# Patient Record
Sex: Male | Born: 1945 | Race: White | Hispanic: No | Smoking: Former smoker
Health system: Southern US, Community
[De-identification: ages and names within clinical notes are randomized; demographics above are authoritative.]

## PROBLEM LIST (undated history)

## (undated) DIAGNOSIS — I10 Essential (primary) hypertension: Secondary | ICD-10-CM

## (undated) DIAGNOSIS — I252 Old myocardial infarction: Secondary | ICD-10-CM

## (undated) DIAGNOSIS — M109 Gout, unspecified: Secondary | ICD-10-CM

## (undated) DIAGNOSIS — I2510 Atherosclerotic heart disease of native coronary artery without angina pectoris: Secondary | ICD-10-CM

## (undated) DIAGNOSIS — J939 Pneumothorax, unspecified: Secondary | ICD-10-CM

## (undated) HISTORY — PX: OTHER SURGICAL HISTORY: SHX169

---

## 2001-11-15 ENCOUNTER — Ambulatory Visit (HOSPITAL_COMMUNITY): Admission: RE | Admit: 2001-11-15 | Discharge: 2001-11-15 | Payer: Self-pay

## 2001-12-16 ENCOUNTER — Encounter: Payer: Self-pay | Admitting: Emergency Medicine

## 2001-12-16 ENCOUNTER — Emergency Department (HOSPITAL_COMMUNITY): Admission: EM | Admit: 2001-12-16 | Discharge: 2001-12-16 | Payer: Self-pay | Admitting: Emergency Medicine

## 2002-04-22 ENCOUNTER — Emergency Department (HOSPITAL_COMMUNITY): Admission: EM | Admit: 2002-04-22 | Discharge: 2002-04-22 | Payer: Self-pay | Admitting: *Deleted

## 2002-04-22 ENCOUNTER — Encounter: Payer: Self-pay | Admitting: *Deleted

## 2002-05-04 ENCOUNTER — Emergency Department (HOSPITAL_COMMUNITY): Admission: EM | Admit: 2002-05-04 | Discharge: 2002-05-04 | Payer: Self-pay | Admitting: Emergency Medicine

## 2003-06-01 ENCOUNTER — Emergency Department (HOSPITAL_COMMUNITY): Admission: EM | Admit: 2003-06-01 | Discharge: 2003-06-01 | Payer: Self-pay | Admitting: Emergency Medicine

## 2003-06-03 ENCOUNTER — Emergency Department (HOSPITAL_COMMUNITY): Admission: EM | Admit: 2003-06-03 | Discharge: 2003-06-03 | Payer: Self-pay | Admitting: Emergency Medicine

## 2003-09-01 ENCOUNTER — Ambulatory Visit (HOSPITAL_COMMUNITY): Admission: RE | Admit: 2003-09-01 | Discharge: 2003-09-01 | Payer: Self-pay | Admitting: Family Medicine

## 2003-12-03 ENCOUNTER — Emergency Department (HOSPITAL_COMMUNITY): Admission: EM | Admit: 2003-12-03 | Discharge: 2003-12-03 | Payer: Self-pay | Admitting: Emergency Medicine

## 2004-04-14 ENCOUNTER — Ambulatory Visit: Payer: Self-pay | Admitting: Orthopedic Surgery

## 2004-04-14 ENCOUNTER — Emergency Department (HOSPITAL_COMMUNITY): Admission: EM | Admit: 2004-04-14 | Discharge: 2004-04-14 | Payer: Self-pay | Admitting: Emergency Medicine

## 2004-04-29 ENCOUNTER — Ambulatory Visit: Payer: Self-pay | Admitting: Orthopedic Surgery

## 2007-08-04 ENCOUNTER — Emergency Department: Payer: Self-pay | Admitting: Emergency Medicine

## 2007-12-30 ENCOUNTER — Emergency Department: Payer: Self-pay | Admitting: Emergency Medicine

## 2008-09-16 ENCOUNTER — Emergency Department: Payer: Self-pay | Admitting: Emergency Medicine

## 2009-01-28 ENCOUNTER — Emergency Department: Payer: Self-pay | Admitting: Emergency Medicine

## 2010-09-17 ENCOUNTER — Encounter: Payer: Self-pay | Admitting: *Deleted

## 2010-09-17 ENCOUNTER — Emergency Department (HOSPITAL_COMMUNITY)
Admission: EM | Admit: 2010-09-17 | Discharge: 2010-09-17 | Disposition: A | Discharge status: Home / Self Care | Payer: Self-pay | Attending: Emergency Medicine | Admitting: Emergency Medicine

## 2010-09-17 ENCOUNTER — Emergency Department (HOSPITAL_COMMUNITY): Payer: Self-pay

## 2010-09-17 DIAGNOSIS — IMO0002 Reserved for concepts with insufficient information to code with codable children: Secondary | ICD-10-CM | POA: Insufficient documentation

## 2010-09-17 DIAGNOSIS — Z87891 Personal history of nicotine dependence: Secondary | ICD-10-CM | POA: Insufficient documentation

## 2010-09-17 DIAGNOSIS — T148XXA Other injury of unspecified body region, initial encounter: Secondary | ICD-10-CM | POA: Insufficient documentation

## 2010-09-17 DIAGNOSIS — S0003XA Contusion of scalp, initial encounter: Secondary | ICD-10-CM | POA: Insufficient documentation

## 2010-09-17 DIAGNOSIS — I10 Essential (primary) hypertension: Secondary | ICD-10-CM | POA: Insufficient documentation

## 2010-09-17 DIAGNOSIS — S0990XA Unspecified injury of head, initial encounter: Secondary | ICD-10-CM

## 2010-09-17 DIAGNOSIS — Y92009 Unspecified place in unspecified non-institutional (private) residence as the place of occurrence of the external cause: Secondary | ICD-10-CM | POA: Insufficient documentation

## 2010-09-17 DIAGNOSIS — S1093XA Contusion of unspecified part of neck, initial encounter: Secondary | ICD-10-CM | POA: Insufficient documentation

## 2010-09-17 LAB — URINALYSIS, ROUTINE W REFLEX MICROSCOPIC
Bilirubin Urine: NEGATIVE
Ketones, ur: NEGATIVE mg/dL
Leukocytes, UA: NEGATIVE
Nitrite: NEGATIVE
pH: 7 (ref 5.0–8.0)

## 2010-09-17 MED ORDER — METHOCARBAMOL 500 MG PO TABS: 500.0000 mg | ORAL_TABLET | Freq: Two times a day (BID) | ORAL | Status: AC

## 2010-09-17 MED ORDER — OXYCODONE-ACETAMINOPHEN 5-325 MG PO TABS
2.0000 | ORAL_TABLET | Freq: Once | ORAL | Status: AC
  Administered 2010-09-17: 2 via ORAL
  Filled 2010-09-17: qty 2

## 2010-09-17 MED ORDER — OXYCODONE-ACETAMINOPHEN 5-325 MG PO TABS: 2.0000 | ORAL_TABLET | ORAL | Status: AC | PRN

## 2010-09-17 NOTE — ED Provider Notes (Signed)
Scribed for Toy Baker, MD, the patient was seen in room APA06/APA06 . This chart was scribed by Ellie Lunch. This patient's care was started at 9:22 AM.   CSN: [id number] Arrival date & time: 09/17/2010  9:04 AM  Chief Complaint  Patient presents with  . Fall   Patient is a 65 y.o. male presenting with fall. The history is provided by the patient.  Fall The accident occurred 1 to 2 hours ago. The fall occurred while standing. The point of impact was the head (and lower back). The pain is present in the head (lower back). The pain is at a severity of 10/10. The pain is severe. Pertinent negatives include no visual change, no abdominal pain, no bowel incontinence, no nausea, no vomiting and no loss of consciousness. The symptoms are aggravated by activity.   Gregory [name] is a 65 y.o. male who presents to the Emergency Department complaining of a fall. Patient was taking a shower and slipped hitting his back and head. The pain in located on the back of his head and right lower back. Pain is constant and is made worse with movement and deep breathing. Pt denies pain radiates. Pt reports he did not lose consciousness. No blood thinners. No radicular symptoms going down right leg.   Past Medical History  Diagnosis Date  . Hypertension   . Lung collapse     Past Surgical History  Procedure Date  . Arm surgery     History reviewed. No pertinent family history.  History  Substance Use Topics  . Smoking status: Former Games developer  . Smokeless tobacco: Not on file  . Alcohol Use: No      Review of Systems  Cardiovascular: Negative for chest pain.  Gastrointestinal: Negative for nausea, vomiting, abdominal pain and bowel incontinence.  Musculoskeletal: Positive for back pain.  Neurological: Negative for loss of consciousness.  All other systems reviewed and are negative.    Physical Exam  BP 200/107  Pulse 73  Temp(Src) 98.7 F (37.1 C) (Oral)  Resp 18  Ht 5\' 4"  (1.626 m)   Wt 230 lb (104.327 kg)  BMI 39.48 kg/m2  SpO2 97%  Physical Exam  Nursing note and vitals reviewed. Constitutional: He is oriented to person, place, and time. He appears well-developed and well-nourished.  HENT:       Small occipital hematoma. No facial trauma.  Eyes: EOM are normal. Pupils are equal, round, and reactive to light.  Neck: Neck supple.  Pulmonary/Chest: Effort normal. He exhibits no tenderness.  Abdominal: Soft. There is no tenderness.  Musculoskeletal:       No pain to palpation cervical, thoracic, lumbar spine. Cervical paraspinal tenderness.  Tender Right CVA. Skin intact no bruising.   Neurological: He is alert and oriented to person, place, and time.       Straight leg raise, patellar reflexes normal.   Psychiatric: He has a normal mood and affect.   OTHER DATA REVIEWED: Nursing notes, vital signs, and past medical records reviewed.  DIAGNOSTIC STUDIES: Oxygen Saturation is 97% on room air, normal by my interpretation.    LABS / RADIOLOGY:  Results for orders placed during the hospital encounter of 09/17/10  URINALYSIS, ROUTINE W REFLEX MICROSCOPIC      Component Value Range   Color, Urine YELLOW  YELLOW    Appearance CLEAR  CLEAR    Specific Gravity, Urine 1.020  1.005 - 1.030    pH 7.0  5.0 - 8.0    Glucose,  UA NEGATIVE  NEGATIVE (mg/dL)   Hgb urine dipstick NEGATIVE  NEGATIVE    Bilirubin Urine NEGATIVE  NEGATIVE    Ketones, ur NEGATIVE  NEGATIVE (mg/dL)   Protein, ur TRACE (*) NEGATIVE (mg/dL)   Urobilinogen, UA 0.2  0.0 - 1.0 (mg/dL)   Nitrite NEGATIVE  NEGATIVE    Leukocytes, UA NEGATIVE  NEGATIVE   URINE MICROSCOPIC-ADD ON      Component Value Range   WBC, UA 0-2  <3 (WBC/hpf)   RBC / HPF 0-2  <3 (RBC/hpf)   Ct Head Wo Contrast  09/17/2010  *RADIOLOGY REPORT*  Clinical Data: Larey Seat 1-2 hours ago, headache, struck back of head  CT HEAD WITHOUT CONTRAST  Technique:  Contiguous axial images were obtained from the base of the skull through the  vertex without contrast.  Comparison: None  Findings: Generalized atrophy. Normal ventricular morphology. No midline shift or mass effect. Small vessel chronic ischemic changes of deep cerebral white matter. No definite intracranial hemorrhage, mass lesion, or evidence of acute infarction. No extra-axial fluid collections. Diffuse enlargement of basilar artery. Partial opacification of ethmoid air cells and left sphenoid sinus. Skull intact.  IMPRESSION: Atrophy with small vessel chronic ischemic changes of deep cerebral white matter. No acute intracranial abnormalities. Diffuse enlargement of the basilar artery.  Original Report Authenticated By: Lollie Marrow, M.D.    MDM:  meds given here, will tx for muscle strain    MEDICATIONS GIVEN IN THE E.D.  Medications  acetaminophen (TYLENOL) 500 MG tablet (not administered)  oxyCODONE-acetaminophen (PERCOCET) 5-325 MG per tablet 2 tablet (2 tablet Oral Given 09/17/10 1019)    DISCHARGE MEDICATIONS: New Prescriptions   No medications on file    Procedures        Toy Baker, MD 09/17/10 1258

## 2010-09-17 NOTE — ED Notes (Signed)
Pt fell in tub and hit his right lower back on the tub and the back of his head on the commode. Pt denies loss of consciousness. Pt has hematoma on back of his head. Pt c/o worsening back pain with movement of deep breathing.

## 2012-09-08 ENCOUNTER — Emergency Department (HOSPITAL_COMMUNITY): Payer: Self-pay

## 2012-09-08 ENCOUNTER — Emergency Department (HOSPITAL_COMMUNITY)
Admission: EM | Admit: 2012-09-08 | Discharge: 2012-09-09 | Disposition: A | Discharge status: Home / Self Care | Payer: Self-pay | Attending: Surgery | Admitting: Surgery

## 2012-09-08 ENCOUNTER — Encounter (HOSPITAL_COMMUNITY): Payer: Self-pay | Admitting: Emergency Medicine

## 2012-09-08 DIAGNOSIS — I10 Essential (primary) hypertension: Secondary | ICD-10-CM | POA: Insufficient documentation

## 2012-09-08 DIAGNOSIS — E669 Obesity, unspecified: Secondary | ICD-10-CM | POA: Insufficient documentation

## 2012-09-08 DIAGNOSIS — K4030 Unilateral inguinal hernia, with obstruction, without gangrene, not specified as recurrent: Secondary | ICD-10-CM | POA: Insufficient documentation

## 2012-09-08 DIAGNOSIS — R109 Unspecified abdominal pain: Secondary | ICD-10-CM | POA: Insufficient documentation

## 2012-09-08 DIAGNOSIS — K4090 Unilateral inguinal hernia, without obstruction or gangrene, not specified as recurrent: Secondary | ICD-10-CM

## 2012-09-08 LAB — CBC WITH DIFFERENTIAL/PLATELET
Basophils Absolute: 0.1 10*3/uL (ref 0.0–0.1)
Basophils Relative: 0 % (ref 0–1)
Eosinophils Absolute: 0.4 10*3/uL (ref 0.0–0.7)
MCH: 29.8 pg (ref 26.0–34.0)
MCHC: 33.6 g/dL (ref 30.0–36.0)
Neutrophils Relative %: 57 % (ref 43–77)
Platelets: 219 10*3/uL (ref 150–400)
RDW: 14.3 % (ref 11.5–15.5)

## 2012-09-08 LAB — URINALYSIS, ROUTINE W REFLEX MICROSCOPIC
Glucose, UA: NEGATIVE mg/dL
Hgb urine dipstick: NEGATIVE
Ketones, ur: NEGATIVE mg/dL
Leukocytes, UA: NEGATIVE
pH: 7 (ref 5.0–8.0)

## 2012-09-08 LAB — COMPREHENSIVE METABOLIC PANEL
ALT: 22 U/L (ref 0–53)
AST: 25 U/L (ref 0–37)
CO2: 25 mEq/L (ref 19–32)
Chloride: 103 mEq/L (ref 96–112)
GFR calc Af Amer: >90 mL/min (ref >90)
GFR calc non Af Amer: 85 mL/min — ABNORMAL LOW (ref >90)
Glucose, Bld: 99 mg/dL (ref 70–99)
Sodium: 137 mEq/L (ref 135–145)
Total Bilirubin: 1 mg/dL (ref 0.3–1.2)

## 2012-09-08 LAB — LIPASE, BLOOD: Lipase: 27 U/L (ref 11–59)

## 2012-09-08 MED ORDER — ONDANSETRON HCL 4 MG/2ML IJ SOLN
4.0000 mg | Freq: Once | INTRAMUSCULAR | Status: AC
  Administered 2012-09-08: 4 mg via INTRAVENOUS
  Filled 2012-09-08: qty 2

## 2012-09-08 MED ORDER — SODIUM CHLORIDE 0.9 % IV BOLUS (SEPSIS)
1000.0000 mL | Freq: Once | INTRAVENOUS | Status: AC
  Administered 2012-09-08: 1000 mL via INTRAVENOUS

## 2012-09-08 MED ORDER — HYDROMORPHONE HCL PF 1 MG/ML IJ SOLN
1.0000 mg | Freq: Once | INTRAMUSCULAR | Status: AC
  Administered 2012-09-08: 1 mg via INTRAVENOUS
  Filled 2012-09-08: qty 1

## 2012-09-08 MED ORDER — SODIUM CHLORIDE 0.9 % IV BOLUS (SEPSIS)
1000.0000 mL | Freq: Once | INTRAVENOUS | Status: AC
Start: 2012-09-08 — End: 2012-09-09
  Administered 2012-09-08: 1000 mL via INTRAVENOUS

## 2012-09-08 MED ORDER — IOHEXOL 300 MG/ML  SOLN
100.0000 mL | Freq: Once | INTRAMUSCULAR | Status: AC | PRN
  Administered 2012-09-08: 100 mL via INTRAVENOUS

## 2012-09-08 MED ORDER — IOHEXOL 300 MG/ML  SOLN
50.0000 mL | Freq: Once | INTRAMUSCULAR | Status: AC | PRN
  Administered 2012-09-08: 50 mL via ORAL

## 2012-09-08 NOTE — ED Notes (Signed)
Pt reports lower abdominal pain x 3 days, denies nausea/vomitting. Denies GU symptoms.

## 2012-09-08 NOTE — ED Provider Notes (Addendum)
Scribed for Gregory Hutching, MD, the patient was seen in room APA11/APA11. This chart was scribed by Lewanda Rife, ED scribe. Patient's care was started at 1808 ' CSN: [id number]     Arrival date & time 09/08/12  1716 History     First MD Initiated Contact with Patient 09/08/12 1757     Chief Complaint  Patient presents with  . Abdominal Pain   (Consider location/radiation/quality/duration/timing/severity/associated sxs/prior Treatment) HPI HPI Comments: Gregory [name] is a 67 y.o. male who presents to the Emergency Department complaining of constant moderate suprapubic abdominal pain without radiation onset 3 days. Reports associated normal bowel movements, and normal appetite. Denies associated fevers, emesis, nausea, and urinary symptoms. Reports symptoms are aggravated by touch and alleviated by nothing. Denies taking any medications PTA to alleviate symptoms. Denies hx of the same. Denies hx of abdominal surgery and significant PMH.  Past Medical History  Diagnosis Date  . Hypertension   . Lung collapse    Past Surgical History  Procedure Laterality Date  . Arm surgery     No family history on file. History  Substance Use Topics  . Smoking status: Former Games developer  . Smokeless tobacco: Not on file  . Alcohol Use: No    Review of Systems  Gastrointestinal: Positive for abdominal pain.   A complete 10 system review of systems was obtained and all systems are negative except as noted in the HPI and PMH.    Allergies  Review of patient's allergies indicates no known allergies.  Home Medications   Current Outpatient Rx  Name  Route  Sig  Dispense  Refill  . naproxen sodium (ALEVE) 220 MG tablet   Oral   Take 220 mg by mouth daily as needed (for pain).          BP 170/102  Pulse 87  Temp(Src) 98.8 F (37.1 C) (Oral)  Resp 16  Ht 5\' 5"  (1.651 m)  Wt 225 lb (102.059 kg)  BMI 37.44 kg/m2  SpO2 95% Physical Exam  Nursing note and vitals reviewed. Constitutional:  He is oriented to person, place, and time. He appears well-developed and well-nourished. No distress.  HENT:  Head: Normocephalic and atraumatic.  Eyes: Conjunctivae and EOM are normal. Pupils are equal, round, and reactive to light.  Neck: Normal range of motion. Neck supple. No tracheal deviation present.  Cardiovascular: Normal rate, regular rhythm and normal heart sounds.   Pulmonary/Chest: Effort normal and breath sounds normal. No respiratory distress.  Abdominal: Soft. Bowel sounds are normal. There is tenderness in the suprapubic area.  Genitourinary:  Right inguinal hernia palpated. Tender on exam  Musculoskeletal: Normal range of motion.  Neurological: He is alert and oriented to person, place, and time.  Skin: Skin is warm and dry.  Psychiatric: He has a normal mood and affect. His behavior is normal.    ED Course   Procedures (including critical care time) Medications  sodium chloride 0.9 % bolus 1,000 mL (1,000 mL Intravenous New Bag/Given 09/08/12 1851)  HYDROmorphone (DILAUDID) injection 1 mg (1 mg Intravenous Given 09/08/12 1850)  ondansetron (ZOFRAN) injection 4 mg (4 mg Intravenous Given 09/08/12 1850)  iohexol (OMNIPAQUE) 300 MG/ML solution 50 mL (50 mL Oral Contrast Given 09/08/12 1859)  iohexol (OMNIPAQUE) 300 MG/ML solution 100 mL (100 mL Intravenous Contrast Given 09/08/12 2001)    Labs Reviewed  COMPREHENSIVE METABOLIC PANEL - Abnormal; Notable for the following:    GFR calc non Af Amer 85 (*)    All other components  within normal limits  CBC WITH DIFFERENTIAL - Abnormal; Notable for the following:    WBC 13.7 (*)    Neutro Abs 7.8 (*)    Lymphs Abs 4.5 (*)    All other components within normal limits  URINALYSIS, ROUTINE W REFLEX MICROSCOPIC  LIPASE, BLOOD    Ct Abdomen Pelvis W Contrast  09/08/2012   *RADIOLOGY REPORT*  Clinical Data: Suprapubic pain  CT ABDOMEN AND PELVIS WITH CONTRAST  Technique:  Multidetector CT imaging of the abdomen and pelvis was  performed following the standard protocol during bolus administration of intravenous contrast.  Contrast: 50mL OMNIPAQUE IOHEXOL 300 MG/ML  SOLN, OMNIPAQUE IOHEXOL 300 MG/ML  SOLN  Comparison: None.  Findings: Sagittal images of the spine shows mild degenerative changes lumbar spine.  There is disc space flattening with vacuum disc phenomenon and mild anterior spurring at L4-L5 level.  Lung bases are unremarkable.  Mild hepatic fatty infiltration.  No focal hepatic mets.  No calcified gallstones are noted within gallbladder. Pancreas, spleen and adrenal glands are unremarkable.  No aortic aneurysm.  Atherosclerotic calcifications of the abdominal aorta and the iliac arteries are noted. SMA atherosclerotic calcifications.  Enhanced kidneys are symmetrical in size.  No hydronephrosis or hydroureter.  There is a cyst in the upper pole of the right kidney posterior aspect measures 3.5 cm.  Cyst in lower pole of the left kidney measures 1.4 cm.  Cyst in the upper pole of the left kidney measures 1.3 cm.  There is bilateral renal symmetrical excretion.  Bilateral visualized proximal ureter is unremarkable.  No small bowel obstruction.  No ascites or free air.  There is stranding of the fat and small amount of fluid probable edema along the right gonadal vein in the right pelvis see axial image 65.  Prostate gland is unremarkable.  There is a large right inguinal hernia.  The hernia is containing in entrapped anterior aspect of the urinary bladder.  This is best visualized in sagittal image 64. The urinary bladder contained within hernia measures at least 6.5 x 6.7 cm.  There is thickening of the urinary bladder wall within hernia.  There is mild stranding of the surrounding fat and mild congestion and vessels are noted adjacent fat.  The findings are suspicious for inflammatory or ischemic changes.  Small left inguinal hernia containing fat measures 2.4 cm.  Normal appendix is clearly visualized in axial image 61.   No pericecal inflammation.  IMPRESSION:  1.  There is a large right inguinal scrotal canal hernia containing more than half of the anterior aspect of the urinary bladder.  The bladder within hernia is distended measures about 6.7 x 6.5 cm. There is thickening of the urinary bladder wall within hernia and there are surrounding mild inflammatory changes.  Findings suspicious for bladder wall inflammation or ischemia.  Clinical correlation is necessary.  The opening of the hernia in axial image 85 measures only 2 cm with strangulation of the mid aspect of the urinary bladder.  2.  There are there is inflammatory changes and mild edema along the right gonadal vein in the right pelvis probable due to inflammatory or ischemic changes within hernia. 3.  Normal appendix.  No pericecal inflammation. 4. No small bowel obstruction.  No free abdominal air.  Critical findings discussed with Dr.Ramsey Guadamuz   Original Report Authenticated By: Natasha Mead, M.D.   No results found. No diagnosis found.    CRITICAL CARE Performed by: Gregory [name] Total critical care time: 30 Critical care time  was exclusive of separately billable procedures and treating other patients. Critical care was necessary to treat or prevent imminent or life-threatening deterioration. Critical care was time spent personally by me on the following activities: development of treatment plan with patient and/or surrogate as well as nursing, discussions with consultants, evaluation of patient's response to treatment, examination of patient, obtaining history from patient or surrogate, ordering and performing treatments and interventions, ordering and review of laboratory studies, ordering and review of radiographic studies, pulse oximetry and re-evaluation of patient's condition. MDM  CT scan reveals a large right inguinal scrotal canal hernia containing greater than 50% of anterior aspect of the urinary bladder.  Initially discussed findings with Dr. Franky Macho general surgeon at [facility].  He recommended consultation with general surgery in [facility] secondary to potential complications from bladder incarceration.  Discussed with Dr. Ovidio Kin.   He will evaluate patient at [facility] emergency department.    I personally performed the services described in this documentation, which was scribed in my presence. The recorded information has been reviewed and is accurate.    Gregory Hutching, MD 09/08/12 1478  Gregory Hutching, MD 09/08/12 2154

## 2012-09-09 ENCOUNTER — Encounter (HOSPITAL_COMMUNITY): Payer: Self-pay | Admitting: Anesthesiology

## 2012-09-09 ENCOUNTER — Encounter (HOSPITAL_COMMUNITY): Admission: EM | Disposition: A | Discharge status: Home / Self Care | Payer: Self-pay | Source: Home / Self Care

## 2012-09-09 ENCOUNTER — Emergency Department (HOSPITAL_COMMUNITY): Payer: Self-pay | Admitting: Anesthesiology

## 2012-09-09 DIAGNOSIS — K4030 Unilateral inguinal hernia, with obstruction, without gangrene, not specified as recurrent: Secondary | ICD-10-CM

## 2012-09-09 HISTORY — PX: INSERTION OF MESH: SHX5868

## 2012-09-09 HISTORY — PX: INGUINAL HERNIA REPAIR: SHX194

## 2012-09-09 SURGERY — REPAIR, HERNIA, INGUINAL, INCARCERATED
Anesthesia: General | Site: Groin | Laterality: Right | Wound class: Clean

## 2012-09-09 MED ORDER — FENTANYL CITRATE 0.05 MG/ML IJ SOLN
INTRAMUSCULAR | Status: DC | PRN
  Administered 2012-09-09: 50 ug via INTRAVENOUS
  Administered 2012-09-09: 150 ug via INTRAVENOUS

## 2012-09-09 MED ORDER — HYDROMORPHONE HCL PF 1 MG/ML IJ SOLN
INTRAMUSCULAR | Status: DC | PRN
  Administered 2012-09-09 (×2): 0.5 mg via INTRAVENOUS
  Administered 2012-09-09: 1 mg via INTRAVENOUS

## 2012-09-09 MED ORDER — LIDOCAINE HCL (CARDIAC) 20 MG/ML IV SOLN
INTRAVENOUS | Status: DC | PRN
  Administered 2012-09-09: 100 mg via INTRAVENOUS

## 2012-09-09 MED ORDER — HYDROCODONE-ACETAMINOPHEN 5-325 MG PO TABS: 1.0000 | ORAL_TABLET | Freq: Four times a day (QID) | ORAL | Status: DC | PRN

## 2012-09-09 MED ORDER — HYDROCODONE-ACETAMINOPHEN 5-325 MG PO TABS
ORAL_TABLET | ORAL | Status: AC
  Filled 2012-09-09: qty 2

## 2012-09-09 MED ORDER — DEXTROSE 5 % IV SOLN
3.0000 g | Freq: Once | INTRAVENOUS | Status: DC
  Filled 2012-09-09: qty 3000

## 2012-09-09 MED ORDER — HYDROMORPHONE HCL PF 1 MG/ML IJ SOLN: 0.2500 mg | INTRAMUSCULAR | Status: DC | PRN

## 2012-09-09 MED ORDER — CEFAZOLIN SODIUM 1-5 GM-% IV SOLN
INTRAVENOUS | Status: AC
  Filled 2012-09-09: qty 50

## 2012-09-09 MED ORDER — CISATRACURIUM BESYLATE (PF) 10 MG/5ML IV SOLN
INTRAVENOUS | Status: DC | PRN
  Administered 2012-09-09: 2 mg via INTRAVENOUS
  Administered 2012-09-09: 6 mg via INTRAVENOUS

## 2012-09-09 MED ORDER — NEOSTIGMINE METHYLSULFATE 1 MG/ML IJ SOLN
INTRAMUSCULAR | Status: DC | PRN
  Administered 2012-09-09: 4 mg via INTRAVENOUS

## 2012-09-09 MED ORDER — GLYCOPYRROLATE 0.2 MG/ML IJ SOLN
INTRAMUSCULAR | Status: DC | PRN
  Administered 2012-09-09: .6 mg via INTRAVENOUS

## 2012-09-09 MED ORDER — CEFAZOLIN SODIUM-DEXTROSE 2-3 GM-% IV SOLR
INTRAVENOUS | Status: AC
  Filled 2012-09-09: qty 50

## 2012-09-09 MED ORDER — KETOROLAC TROMETHAMINE 30 MG/ML IJ SOLN
15.0000 mg | Freq: Once | INTRAMUSCULAR | Status: AC | PRN
  Administered 2012-09-09: 30 mg via INTRAVENOUS

## 2012-09-09 MED ORDER — PROMETHAZINE HCL 25 MG/ML IJ SOLN: 6.2500 mg | INTRAMUSCULAR | Status: DC | PRN

## 2012-09-09 MED ORDER — DEXTROSE 5 % IV SOLN
3.0000 g | Freq: Once | INTRAVENOUS | Status: DC
  Administered 2012-09-09: 3 g via INTRAVENOUS

## 2012-09-09 MED ORDER — BUPIVACAINE HCL (PF) 0.25 % IJ SOLN
INTRAMUSCULAR | Status: AC
  Filled 2012-09-09: qty 30

## 2012-09-09 MED ORDER — LACTATED RINGERS IV SOLN
INTRAVENOUS | Status: DC
  Administered 2012-09-09: 11:00:00 via INTRAVENOUS

## 2012-09-09 MED ORDER — SUCCINYLCHOLINE CHLORIDE 20 MG/ML IJ SOLN
INTRAMUSCULAR | Status: DC | PRN
  Administered 2012-09-09: 100 mg via INTRAVENOUS

## 2012-09-09 MED ORDER — PROPOFOL 10 MG/ML IV BOLUS
INTRAVENOUS | Status: DC | PRN
  Administered 2012-09-09: 200 mg via INTRAVENOUS

## 2012-09-09 MED ORDER — LACTATED RINGERS IV SOLN
INTRAVENOUS | Status: DC | PRN
  Administered 2012-09-09 (×2): via INTRAVENOUS

## 2012-09-09 MED ORDER — DEXAMETHASONE SODIUM PHOSPHATE 10 MG/ML IJ SOLN
INTRAMUSCULAR | Status: DC | PRN
  Administered 2012-09-09: 10 mg via INTRAVENOUS

## 2012-09-09 MED ORDER — MEPERIDINE HCL 50 MG/ML IJ SOLN: 6.2500 mg | INTRAMUSCULAR | Status: DC | PRN

## 2012-09-09 MED ORDER — HYDROCODONE-ACETAMINOPHEN 5-325 MG PO TABS
1.0000 | ORAL_TABLET | Freq: Four times a day (QID) | ORAL | Status: DC | PRN
  Administered 2012-09-09: 2 via ORAL

## 2012-09-09 MED ORDER — MIDAZOLAM HCL 5 MG/5ML IJ SOLN
INTRAMUSCULAR | Status: DC | PRN
  Administered 2012-09-09: 2 mg via INTRAVENOUS

## 2012-09-09 MED ORDER — ONDANSETRON HCL 4 MG/2ML IJ SOLN
INTRAMUSCULAR | Status: DC | PRN
  Administered 2012-09-09: 4 mg via INTRAVENOUS

## 2012-09-09 MED ORDER — EPHEDRINE SULFATE 50 MG/ML IJ SOLN
INTRAMUSCULAR | Status: DC | PRN
  Administered 2012-09-09: 10 mg via INTRAVENOUS

## 2012-09-09 MED ORDER — BUPIVACAINE HCL 0.25 % IJ SOLN
INTRAMUSCULAR | Status: DC | PRN
  Administered 2012-09-09: 30 mL

## 2012-09-09 MED ORDER — KETOROLAC TROMETHAMINE 30 MG/ML IJ SOLN
INTRAMUSCULAR | Status: AC
  Filled 2012-09-09: qty 1

## 2012-09-09 SURGICAL SUPPLY — 37 items
BLADE HEX COATED 2.75 (ELECTRODE) ×2 IMPLANT
BLADE SURG 15 STRL LF DISP TIS (BLADE) ×1 IMPLANT
BLADE SURG 15 STRL SS (BLADE) ×1
BLADE SURG SZ10 CARB STEEL (BLADE) ×2 IMPLANT
CANISTER SUCTION 2500CC (MISCELLANEOUS) ×2 IMPLANT
CLOTH BEACON ORANGE TIMEOUT ST (SAFETY) ×2 IMPLANT
DERMABOND ADVANCED (GAUZE/BANDAGES/DRESSINGS) ×1
DERMABOND ADVANCED .7 DNX12 (GAUZE/BANDAGES/DRESSINGS) ×1 IMPLANT
DRAIN PENROSE 18X1/2 LTX STRL (DRAIN) ×2 IMPLANT
DRAPE LAPAROTOMY TRNSV 102X78 (DRAPE) ×2 IMPLANT
ELECT REM PT RETURN 9FT ADLT (ELECTROSURGICAL) ×2
ELECTRODE REM PT RTRN 9FT ADLT (ELECTROSURGICAL) ×1 IMPLANT
GLOVE BIOGEL PI IND STRL 7.0 (GLOVE) ×1 IMPLANT
GLOVE BIOGEL PI INDICATOR 7.0 (GLOVE) ×1
GLOVE SURG SIGNA 7.5 PF LTX (GLOVE) ×2 IMPLANT
GOWN STRL NON-REIN LRG LVL3 (GOWN DISPOSABLE) ×2 IMPLANT
GOWN STRL REIN XL XLG (GOWN DISPOSABLE) ×2 IMPLANT
KIT BASIN OR (CUSTOM PROCEDURE TRAY) ×2 IMPLANT
MESH ULTRAPRO 3X6 7.6X15CM (Mesh General) ×2 IMPLANT
NEEDLE HYPO 25X1 1.5 SAFETY (NEEDLE) ×2 IMPLANT
NS IRRIG 1000ML POUR BTL (IV SOLUTION) ×2 IMPLANT
PACK BASIC VI WITH GOWN DISP (CUSTOM PROCEDURE TRAY) ×2 IMPLANT
PENCIL BUTTON HOLSTER BLD 10FT (ELECTRODE) ×2 IMPLANT
SPONGE GAUZE 4X4 12PLY (GAUZE/BANDAGES/DRESSINGS) ×2 IMPLANT
SPONGE LAP 18X18 X RAY DECT (DISPOSABLE) ×2 IMPLANT
SPONGE LAP 4X18 X RAY DECT (DISPOSABLE) ×2 IMPLANT
SUT MON AB 5-0 PS2 18 (SUTURE) ×2 IMPLANT
SUT NOVA 0 T19/GS 22DT (SUTURE) ×10 IMPLANT
SUT VIC AB 0 CT2 27 (SUTURE) ×2 IMPLANT
SUT VIC AB 2-0 SH 27 (SUTURE) ×1
SUT VIC AB 2-0 SH 27X BRD (SUTURE) ×1 IMPLANT
SUT VIC AB 3-0 SH 18 (SUTURE) ×4 IMPLANT
SUT VIC AB 3-0 SH 8-18 (SUTURE) ×4 IMPLANT
SYR BULB IRRIGATION 50ML (SYRINGE) ×2 IMPLANT
SYR CONTROL 10ML LL (SYRINGE) ×2 IMPLANT
TOWEL OR 17X26 10 PK STRL BLUE (TOWEL DISPOSABLE) ×2 IMPLANT
YANKAUER SUCT BULB TIP 10FT TU (MISCELLANEOUS) ×2 IMPLANT

## 2012-09-09 NOTE — ED Notes (Signed)
Bed: XB14 Expected date:  Expected time:  Means of arrival:  Comments: Pt from [facility] for SX

## 2012-09-09 NOTE — Anesthesia Procedure Notes (Signed)
Procedure Name: Intubation Date/Time: 09/09/2012 7:47 AM Performed by: Leroy Libman L Patient Re-evaluated:Patient Re-evaluated prior to inductionOxygen Delivery Method: Circle system utilized Preoxygenation: Pre-oxygenation with 100% oxygen Intubation Type: IV induction Ventilation: Mask ventilation without difficulty and Oral airway inserted - appropriate to patient size Laryngoscope Size: Hyacinth Meeker and 2 Grade View: Grade I Tube type: Oral Tube size: 8.0 mm Number of attempts: 1 Airway Equipment and Method: Stylet Placement Confirmation: ETT inserted through vocal cords under direct vision,  breath sounds checked- equal and bilateral and positive ETCO2 Secured at: 20 cm Tube secured with: Tape Dental Injury: Teeth and Oropharynx as per pre-operative assessment

## 2012-09-09 NOTE — H&P (Signed)
Re:   Gregory [name] DOB:   1945-09-22 MRN:   [id number]  ASSESSMENT AND PLAN: 1.  Incarcerated right inguinal hernia  I discussed the indications and complications of hernia surgery with the patient.  I discussed both the laparoscopic and open approach to hernia repair. Because of incarceration, I don't think that he is a laparoscopic candidate.  The potential risks of hernia surgery include, but are not limited to, bleeding, infection, open surgery, nerve injury, and recurrence of the hernia.  I provided the patient literature about hernia surgery.  The hernia appears reduced from the CT that I reviewed.  2.  Bladder in hernia  Most likely this will be okay with reduction.  He was transferred to here from [facility] because Dr. Zenon Mayo thought the hernia would be better managed here with urology support.  There is no blood in the UA.  3.  HTN - he is on no meds, just says that when he gets a headache, his BP is high 4.  History of pneumothorax  Occurred with pneumonia in 1999.  No further pulmonary problems 5.  Obese 6.  Very poor dentition  Chief Complaint  Patient presents with  . Abdominal Pain   REFERRING PHYSICIAN:  [name], MD, [facility]  HISTORY OF PRESENT ILLNESS: Gregory [name] is a 67 y.o. (DOB: 1945/07/30)  white  male whose primary care physician is No primary provider on file. and came to [facility] ER yesterday for right groin pain. His wife and son, [name], are with him.  The patient has had a hernia for > than one year.  He does not have a PCP and does not go to MDs with any regularity.  About 3 days ago, he developed increasing right groin pain that go so bad, he went to the [facility] ER.  He was diagnosed with a right inguinal hernia.  Because of a CT scan that showed bladder in the hernia, Dr. Zenon Mayo thought he would be best served being seen where there was urologic back up.  Dr. Lovell Sheehan did not see the patient. He was transferred by Dr. Adriana Simas to  [facility] for me to see. The patient thinks he had a colonoscopy about 10 years ago.  He has had no prior abdominal surgery.  CT scan - 09/08/2012 - large right scrotal hernia with anterior aspect of urinary bladder. WBC - 13,700 - 09/08/2012 UA has no blood in it.   Past Medical History  Diagnosis Date  . Hypertension   . Lung collapse     Past Surgical History  Procedure Laterality Date  . Arm surgery       No current facility-administered medications for this encounter.   Current Outpatient Prescriptions  Medication Sig Dispense Refill  . naproxen sodium (ALEVE) 220 MG tablet Take 220 mg by mouth daily as needed (for pain).         No Known Allergies  REVIEW OF SYSTEMS: Skin:  No history of rash.  No history of abnormal moles. Infection:  No history of hepatitis or HIV.  No history of MRSA. Neurologic:  No history of stroke.  No history of seizure.  No history of headaches. Cardiac:  Unsure of his hypertension.   No history of heart disease.  No history of prior cardiac catheterization.  No history of seeing a cardiologist. Pulmonary:  Pneumonia in 1999 which lead to a pneumothorax.  Hospitalized at [facility].  No further lung problems.  Endocrine:  No diabetes. No thyroid disease. Gastrointestinal:  No history of stomach disease.  No history of liver disease.  No history of gall bladder disease.  No history of pancreas disease.  No history of colon disease. Urologic:  No history of kidney stones.  No history of bladder infections. Musculoskeletal:  No history of joint or back disease. Hematologic:  No bleeding disorder.  No history of anemia.  Not anticoagulated. Psycho-social:  The patient is oriented.   The patient has no obvious psychologic or social impairment to understanding our conversation and plan.  SOCIAL and FAMILY HISTORY: Retired Naval architect - around 5784. Married. Son with him.  PHYSICAL EXAM: BP 156/100  Pulse 86  Temp(Src) 98.8 F (37.1 C)  (Oral)  Resp 14  Ht 5\' 5"  (1.651 m)  Wt 225 lb (102.059 kg)  BMI 37.44 kg/m2  SpO2 93%  General: Obese WM who is alert and generally healthy appearing.  HEENT: Normal. Pupils equal. Neck: Supple. No mass.  No thyroid mass. Lymph Nodes:  No supraclavicular or cervical nodes. Lungs: Clear to auscultation and symmetric breath sounds. Heart:  RRR. No murmur or rub.  Abdomen: Soft. No hernia. Normal bowel sounds.  No abdominal scars.  Tender right groin mass consistent with hernia.  I suspect that this is not as bid as when seen on CT scan. Rectal: Not done. Extremities:  Good strength and ROM  in upper and lower extremities. Neurologic:  Grossly intact to motor and sensory function. Psychiatric: Has normal mood and affect. Behavior is normal.   DATA REVIEWED: Epic notes and CT scan  Ovidio Kin, MD,  [facility] [address] [place]  [address]   [place], [place]    [address] Phone:  [phone] FAX:  [phone]

## 2012-09-09 NOTE — Anesthesia Preprocedure Evaluation (Signed)
Anesthesia Evaluation  Patient identified by MRN, date of birth, ID band Patient awake    Reviewed: Allergy & Precautions, H&P , NPO status , Patient's Chart, lab work & pertinent test results  Airway Mallampati: III TM Distance: >3 FB Neck ROM: full    Dental no notable dental hx. (+) Teeth Intact   Pulmonary neg pulmonary ROS,    Pulmonary exam normal       Cardiovascular     Neuro/Psych negative neurological ROS  negative psych ROS   GI/Hepatic negative GI ROS, Neg liver ROS,   Endo/Other  negative endocrine ROS  Renal/GU negative Renal ROS  negative genitourinary   Musculoskeletal   Abdominal Normal abdominal exam  (+)   Peds  Hematology negative hematology ROS (+)   Anesthesia Other Findings   Reproductive/Obstetrics negative OB ROS                           Anesthesia Physical Anesthesia Plan  ASA: II  Anesthesia Plan: General   Post-op Pain Management:    Induction: Intravenous  Airway Management Planned: Oral ETT  Additional Equipment:   Intra-op Plan:   Post-operative Plan: Extubation in OR  Informed Consent: I have reviewed the patients History and Physical, chart, labs and discussed the procedure including the risks, benefits and alternatives for the proposed anesthesia with the patient or authorized representative who has indicated his/her understanding and acceptance.   Dental Advisory Given  Plan Discussed with: CRNA and Surgeon  Anesthesia Plan Comments:         Anesthesia Quick Evaluation

## 2012-09-09 NOTE — Transfer of Care (Signed)
Immediate Anesthesia Transfer of Care Note  Patient: Gregory [name]  Procedure(s) Performed: Procedure(s): HERNIA REPAIR INGUINAL INCARCERATED (Right) INSERTION OF MESH (Right)  Patient Location: PACU  Anesthesia Type:General  Level of Consciousness: sedated  Airway & Oxygen Therapy: Patient Spontanous Breathing and Patient connected to face mask oxygen  Post-op Assessment: Report given to PACU RN and Post -op Vital signs reviewed and stable  Post vital signs: Reviewed and stable  Complications: No apparent anesthesia complications

## 2012-09-09 NOTE — Anesthesia Postprocedure Evaluation (Signed)
Anesthesia Post Note  Patient: Gregory [name]  Procedure(s) Performed: Procedure(s) (LRB): HERNIA REPAIR INGUINAL INCARCERATED (Right) INSERTION OF MESH (Right)  Anesthesia type: General  Patient location: PACU  Post pain: Pain level controlled  Post assessment: Post-op Vital signs reviewed  Last Vitals:  Filed Vitals:   09/09/12 1030  BP:   Pulse: 91  Temp:   Resp: 19    Post vital signs: Reviewed  Level of consciousness: sedated  Complications: No apparent anesthesia complications

## 2012-09-09 NOTE — Preoperative (Signed)
Beta Blockers   Reason not to administer Beta Blockers:Not Applicable 

## 2012-09-09 NOTE — Op Note (Signed)
09/08/2012 - 09/09/2012  9:39 AM  PATIENT:  Gregory [name], 67 y.o., male, MRN: [id number]  PREOP DIAGNOSIS:  incarcerated right inguinal hernia  POSTOP DIAGNOSIS:   Incarcerated direct right inguinal hernia  PROCEDURE:   Procedure(s): Right HERNIA REPAIR INGUINAL, INCARCERATED, INSERTION OF MESH  SURGEON:   Ovidio Kin, M.D.  ANESTHESIA:   general  Anesthesiologist: Sandrea Hughs., MD CRNA: Florene Route, CRNA  General  EBL:  Minimal  ml  LOCAL MEDICATIONS USED:   30  Cc 1/4%   SPECIMEN:   None  COUNTS CORRECT:  YES  INDICATIONS FOR PROCEDURE:  Gregory [name] is a 67 y.o. (DOB: [birth year]) white  male whose primary care physician is No primary provider on file. and comes for incarcerated right inguinal hernia repair.  There are concerns of the bladder being in the hernia.  I have spoken with Dr. Laverle Patter about this pre op, but I think this is just a standard incarcerated hernia.   The indications and risks of the hernia surgery were explained to the patient.  The risks include, but are not limited to, infection, bleeding, recurrence of the hernia, and nerve injury.  Operative Note: The patient was taken to room number 1 at [facility] OR.  He underwent a general anesthesia.    A time out was held and the surgical checklist run.  His lower abdomen was shaved and then prepped with chloroprep.  A right inguinal incision was made through the subcutaneous fat to the external oblique fascia.  The external ring was opened and the cord structures encircled with a penrose drain.  The patient had a incarcerated direct hernia.  The external ring was opened and the cord structures encircled with a penrose drain.  The patient had an large incarcerated direct right inguinal hernia.   The sac was dissected free of the cord structures.  I used 2-0 Vicryl to imbricate the inguinal floor and reduce the hernia back to below the inguinal floor.  The hernia sac protruded more that 15 cm.  I  found no obvious indirect component to the hernia.  The inguinal floor was repaired with a 3 x 6 inch piece of Ultrapro mesh.  The mesh was cut to fit the inguinal floor.  The mesh was sewn in place with interrupted 0 Novafil suture.  A key hole was made for the internal ring.  The mesh lay flat.  The inguinal floor was covered and the internal ring recreated.  The cord structures were returned to a normal location.  The external oblique was closed with a 3-0 vicryl.  The fascia and subcutaneous tissues were infiltrated with 1/4% marcaine plain.  The skin was closed with 5-0 Vicryl and painted with Dermabond. The sponge and needle count were correct at the end of the case.  The patient was transported to the recovery room in good condition.  The patient will go probably go home today.  Discharge instructions reviewed.  Ovidio Kin, MD, [facility] Surgery Pager: [phone] Office phone:  [phone]

## 2012-09-10 ENCOUNTER — Encounter (HOSPITAL_COMMUNITY): Payer: Self-pay | Admitting: Surgery

## 2012-09-26 ENCOUNTER — Telehealth (INDEPENDENT_AMBULATORY_CARE_PROVIDER_SITE_OTHER): Payer: Self-pay

## 2012-09-26 NOTE — Telephone Encounter (Signed)
Patient aware of appt  09-28-12@5pm  with Dr. Ezzard Standing

## 2012-09-28 ENCOUNTER — Ambulatory Visit (INDEPENDENT_AMBULATORY_CARE_PROVIDER_SITE_OTHER): Payer: Medicare Other | Admitting: Surgery

## 2012-09-28 ENCOUNTER — Encounter (INDEPENDENT_AMBULATORY_CARE_PROVIDER_SITE_OTHER): Payer: Self-pay | Admitting: Surgery

## 2012-09-28 VITALS — BP 152/98 | HR 82 | Resp 16 | Ht 64.0 in | Wt 214.8 lb

## 2012-09-28 DIAGNOSIS — Z8719 Personal history of other diseases of the digestive system: Secondary | ICD-10-CM | POA: Insufficient documentation

## 2012-09-28 DIAGNOSIS — Z9889 Other specified postprocedural states: Secondary | ICD-10-CM

## 2012-09-28 NOTE — Progress Notes (Signed)
POV  Doing well except sore. His wife said that his penis turned blue.  I discussed ecchymosis and that this should resolve. Wife is with patient.  The patient is urinating much better than before surgery  BP 152/98  Pulse 82  Resp 16  Ht 5\' 4"  (1.626 m)  Wt 214 lb 12.8 oz (97.433 kg)  BMI 36.85 kg/m2  Abdomen:  Right groin wound looks good.  Most of the ecchymosis is better.  Has done well post op. Return appt is PRN.  Ovidio Kin, MD, [facility] Surgery Pager: [phone] Office phone:  [phone]

## 2012-12-26 ENCOUNTER — Encounter (HOSPITAL_COMMUNITY): Payer: Self-pay | Admitting: Pharmacy Technician

## 2012-12-26 NOTE — Patient Instructions (Addendum)
Your procedure is scheduled on:  12/31/2012  Report to [facility] at 12:00  PM.  Call this number if you have problems the morning of surgery: [phone]   Remember:   Do not eat or drink:After Midnight.  Take these medicines the morning of surgery with A SIP OF WATER: None   Do not wear jewelry, make-up or nail polish.  Do not wear lotions, powders, or perfumes. You may wear deodorant.  Do not shave 48 hours prior to surgery. Men may shave face and neck.  Do not bring valuables to the hospital.  Contacts, dentures or bridgework may not be worn into surgery.  Leave suitcase in the car. After surgery it may be brought to your room.  For patients admitted to the hospital, checkout time is 11:00 AM the day of discharge.   Patients discharged the day of surgery will not be allowed to drive home.    Special Instructions: Start using your eye drops before surgery as directed by your eye doctor.   Please read over the following fact sheets that you were given: Anesthesia Post-op Instructions    Cataract Surgery  A cataract is a clouding of the lens of the eye. When a lens becomes cloudy, vision is reduced based on the degree and nature of the clouding. Surgery may be needed to improve vision. Surgery removes the cloudy lens and usually replaces it with a substitute lens (intraocular lens, IOL). LET YOUR EYE DOCTOR KNOW ABOUT:  Allergies to food or medicine.  Medicines taken including herbs, eyedrops, over-the-counter medicines, and creams.  Use of steroids (by mouth or creams).  Previous problems with anesthetics or numbing medicine.  History of bleeding problems or blood clots.  Previous surgery.  Other health problems, including diabetes and kidney problems.  Possibility of pregnancy, if this applies. RISKS AND COMPLICATIONS  Infection.  Inflammation of the eyeball (endophthalmitis) that can spread to both eyes (sympathetic ophthalmia).  Poor wound healing.  If an IOL is  inserted, it can later fall out of proper position. This is very uncommon.  Clouding of the part of your eye that holds an IOL in place. This is called an "after-cataract." These are uncommon, but easily treated. BEFORE THE PROCEDURE  Do not eat or drink anything except small amounts of water for 8 to 12 before your surgery, or as directed by your caregiver.  Unless you are told otherwise, continue any eyedrops you have been prescribed.  Talk to your primary caregiver about all other medicines that you take (both prescription and non-prescription). In some cases, you may need to stop or change medicines near the time of your surgery. This is most important if you are taking blood-thinning medicine.Do not stop medicines unless you are told to do so.  Arrange for someone to drive you to and from the procedure.  Do not put contact lenses in either eye on the day of your surgery. PROCEDURE There is more than one method for safely removing a cataract. Your doctor can explain the differences and help determine which is best for you. Phacoemulsification surgery is the most common form of cataract surgery.  An injection is given behind the eye or eyedrops are given to make this a painless procedure.  A small cut (incision) is made on the edge of the clear, dome-shaped surface that covers the front of the eye (cornea).  A tiny probe is painlessly inserted into the eye. This device gives off ultrasound waves that soften and break up the  cloudy center of the lens. This makes it easier for the cloudy lens to be removed by suction.  An IOL may be implanted.  The normal lens of the eye is covered by a clear capsule. Part of that capsule is intentionally left in the eye to support the IOL.  Your surgeon may or may not use stitches to close the incision. There are other forms of cataract surgery that require a larger incision and stiches to close the eye. This approach is taken in cases where the  doctor feels that the cataract cannot be easily removed using phacoemulsification. AFTER THE PROCEDURE  When an IOL is implanted, it does not need care. It becomes a permanent part of your eye and cannot be seen or felt.  Your doctor will schedule follow-up exams to check on your progress.  Review your other medicines with your doctor to see which can be resumed after surgery.  Use eyedrops or take medicine as prescribed by your doctor. Document Released: 12/30/2010 Document Revised: 04/04/2011 Document Reviewed: 12/30/2010 [facility] Patient Information 2013 [place], [place].    PATIENT INSTRUCTIONS POST-ANESTHESIA  IMMEDIATELY FOLLOWING SURGERY:  Do not drive or operate machinery for the first twenty four hours after surgery.  Do not make any important decisions for twenty four hours after surgery or while taking narcotic pain medications or sedatives.  If you develop intractable nausea and vomiting or a severe headache please notify your doctor immediately.  FOLLOW-UP:  Please make an appointment with your surgeon as instructed. You do not need to follow up with anesthesia unless specifically instructed to do so.  WOUND CARE INSTRUCTIONS (if applicable):  Keep a dry clean dressing on the anesthesia/puncture wound site if there is drainage.  Once the wound has quit draining you may leave it open to air.  Generally you should leave the bandage intact for twenty four hours unless there is drainage.  If the epidural site drains for more than 36-48 hours please call the anesthesia department.  QUESTIONS?:  Please feel free to call your physician or the hospital operator if you have any questions, and they will be happy to assist you.

## 2012-12-27 ENCOUNTER — Encounter (HOSPITAL_COMMUNITY)
Admission: RE | Admit: 2012-12-27 | Discharge: 2012-12-27 | Disposition: A | Discharge status: Home / Self Care | Payer: Medicare Other | Source: Ambulatory Visit | Attending: Ophthalmology | Admitting: Ophthalmology

## 2012-12-27 NOTE — Progress Notes (Signed)
Pt came in for pre-op interview for cataract surgery scheduled for 12/31/12. Pt's BP 201/106, HR 73. Rechecked BP and it was shown to be 193/106, HR 73. Pt is on no blood pressure medications and says he was not aware of any blood pressure issues, although hypertension is present in his medical history in Epic. Pt denied headache, dizziness, seeing spots. Pt is positive for blurry vision but pt says this is due to his cataract and has been present for awhile now. Dr. Jayme Cloud notified and said to send pt to his primary doctor today to be seen for his hypertension and to have his surgery rescheduled once his hypertension was under control. [facility] was called and an appt was set up for pt to seen by Lelon Mast, PA at 1000 today 12/27/12. Pt was notified of the situation and given appt time and was told that surgery would be rescheduled by Dr. Lanae Crumbly office once his blood pressure was stabilized. Dr. Lanae Crumbly office called, I spoke with Chaney Malling, front office staff, and explained the pt's situation and they are aware that pt's surgery is canceled for 12/31/12 and that they are to follow up with pt to get his surgery rescheduled once his hypertension is under control. All questions were answered by pt and his wife, they are upset with the situation, but in agreement with the plan of care.

## 2012-12-31 ENCOUNTER — Ambulatory Visit (HOSPITAL_COMMUNITY): Admission: RE | Admit: 2012-12-31 | Payer: Medicare Other | Source: Ambulatory Visit | Admitting: Ophthalmology

## 2012-12-31 ENCOUNTER — Encounter (HOSPITAL_COMMUNITY): Admission: RE | Payer: Self-pay | Source: Ambulatory Visit

## 2012-12-31 SURGERY — PHACOEMULSIFICATION, CATARACT, WITH IOL INSERTION
Anesthesia: Monitor Anesthesia Care | Site: Eye | Laterality: Right

## 2013-01-04 ENCOUNTER — Encounter (HOSPITAL_COMMUNITY): Payer: Self-pay | Admitting: Pharmacy Technician

## 2013-01-07 NOTE — Patient Instructions (Signed)
Your procedure is scheduled on: 01/14/2013  Report to [facility] at  0630  AM.  Call this number if you have problems the morning of surgery: [phone]   Do not eat food or drink liquids :After Midnight.      Take these medicines the morning of surgery with A SIP OF WATER: none   Do not wear jewelry, make-up or nail polish.  Do not wear lotions, powders, or perfumes.   Do not shave 48 hours prior to surgery.  Do not bring valuables to the hospital.  Contacts, dentures or bridgework may not be worn into surgery.  Leave suitcase in the car. After surgery it may be brought to your room.  For patients admitted to the hospital, checkout time is 11:00 AM the day of discharge.   Patients discharged the day of surgery will not be allowed to drive home.  :     Please read over the following fact sheets that you were given: Coughing and Deep Breathing, Surgical Site Infection Prevention, Anesthesia Post-op Instructions and Care and Recovery After Surgery    Cataract A cataract is a clouding of the lens of the eye. When a lens becomes cloudy, vision is reduced based on the degree and nature of the clouding. Many cataracts reduce vision to some degree. Some cataracts make people more near-sighted as they develop. Other cataracts increase glare. Cataracts that are ignored and become worse can sometimes look white. The white color can be seen through the pupil. CAUSES   Aging. However, cataracts may occur at any age, even in newborns.   Certain drugs.   Trauma to the eye.   Certain diseases such as diabetes.   Specific eye diseases such as chronic inflammation inside the eye or a sudden attack of a rare form of glaucoma.   Inherited or acquired medical problems.  SYMPTOMS   Gradual, progressive drop in vision in the affected eye.   Severe, rapid visual loss. This most often happens when trauma is the cause.  DIAGNOSIS  To detect a cataract, an eye doctor examines the lens. Cataracts are  best diagnosed with an exam of the eyes with the pupils enlarged (dilated) by drops.  TREATMENT  For an early cataract, vision may improve by using different eyeglasses or stronger lighting. If that does not help your vision, surgery is the only effective treatment. A cataract needs to be surgically removed when vision loss interferes with your everyday activities, such as driving, reading, or watching TV. A cataract may also have to be removed if it prevents examination or treatment of another eye problem. Surgery removes the cloudy lens and usually replaces it with a substitute lens (intraocular lens, IOL).  At a time when both you and your doctor agree, the cataract will be surgically removed. If you have cataracts in both eyes, only one is usually removed at a time. This allows the operated eye to heal and be out of danger from any possible problems after surgery (such as infection or poor wound healing). In rare cases, a cataract may be doing damage to your eye. In these cases, your caregiver may advise surgical removal right away. The vast majority of people who have cataract surgery have better vision afterward. HOME CARE INSTRUCTIONS  If you are not planning surgery, you may be asked to do the following:  Use different eyeglasses.   Use stronger or brighter lighting.   Ask your eye doctor about reducing your medicine dose or changing medicines if  it is thought that a medicine caused your cataract. Changing medicines does not make the cataract go away on its own.   Become familiar with your surroundings. Poor vision can lead to injury. Avoid bumping into things on the affected side. You are at a higher risk for tripping or falling.   Exercise extreme care when driving or operating machinery.   Wear sunglasses if you are sensitive to bright light or experiencing problems with glare.  SEEK IMMEDIATE MEDICAL CARE IF:   You have a worsening or sudden vision loss.   You notice redness,  swelling, or increasing pain in the eye.   You have a fever.  Document Released: 01/10/2005 Document Revised: 12/30/2010 Document Reviewed: 09/03/2010 [facility] Patient Information 2012 [place].PATIENT INSTRUCTIONS POST-ANESTHESIA  IMMEDIATELY FOLLOWING SURGERY:  Do not drive or operate machinery for the first twenty four hours after surgery.  Do not make any important decisions for twenty four hours after surgery or while taking narcotic pain medications or sedatives.  If you develop intractable nausea and vomiting or a severe headache please notify your doctor immediately.  FOLLOW-UP:  Please make an appointment with your surgeon as instructed. You do not need to follow up with anesthesia unless specifically instructed to do so.  WOUND CARE INSTRUCTIONS (if applicable):  Keep a dry clean dressing on the anesthesia/puncture wound site if there is drainage.  Once the wound has quit draining you may leave it open to air.  Generally you should leave the bandage intact for twenty four hours unless there is drainage.  If the epidural site drains for more than 36-48 hours please call the anesthesia department.  QUESTIONS?:  Please feel free to call your physician or the hospital operator if you have any questions, and they will be happy to assist you.

## 2013-01-08 ENCOUNTER — Encounter (HOSPITAL_COMMUNITY): Payer: Self-pay

## 2013-01-08 ENCOUNTER — Other Ambulatory Visit: Payer: Self-pay

## 2013-01-08 ENCOUNTER — Encounter (HOSPITAL_COMMUNITY): Payer: Self-pay | Admitting: Pharmacy Technician

## 2013-01-08 ENCOUNTER — Encounter (HOSPITAL_COMMUNITY)
Admission: RE | Admit: 2013-01-08 | Discharge: 2013-01-08 | Disposition: A | Discharge status: Home / Self Care | Payer: Medicare Other | Source: Ambulatory Visit | Attending: Ophthalmology | Admitting: Ophthalmology

## 2013-01-08 DIAGNOSIS — Z01812 Encounter for preprocedural laboratory examination: Secondary | ICD-10-CM | POA: Insufficient documentation

## 2013-01-08 DIAGNOSIS — Z01818 Encounter for other preprocedural examination: Secondary | ICD-10-CM | POA: Insufficient documentation

## 2013-01-08 DIAGNOSIS — Z01810 Encounter for preprocedural cardiovascular examination: Secondary | ICD-10-CM | POA: Insufficient documentation

## 2013-01-08 LAB — BASIC METABOLIC PANEL
CO2: 30 mEq/L (ref 19–32)
Chloride: 94 mEq/L — ABNORMAL LOW (ref 96–112)
GFR calc Af Amer: 82 mL/min — ABNORMAL LOW (ref >90)
Potassium: 3.2 mEq/L — ABNORMAL LOW (ref 3.5–5.1)
Sodium: 139 mEq/L (ref 135–145)

## 2013-01-08 LAB — HEMOGLOBIN AND HEMATOCRIT, BLOOD
HCT: 51.2 % (ref 39.0–52.0)
Hemoglobin: 18 g/dL — ABNORMAL HIGH (ref 13.0–17.0)

## 2013-01-11 MED ORDER — CYCLOPENTOLATE-PHENYLEPHRINE OP SOLN OPTIME - NO CHARGE
OPHTHALMIC | Status: AC
  Filled 2013-01-11: qty 2

## 2013-01-11 MED ORDER — TETRACAINE HCL 0.5 % OP SOLN
OPHTHALMIC | Status: AC
  Filled 2013-01-11: qty 2

## 2013-01-11 MED ORDER — LIDOCAINE HCL (PF) 1 % IJ SOLN
INTRAMUSCULAR | Status: AC
  Filled 2013-01-11: qty 2

## 2013-01-11 MED ORDER — NEOMYCIN-POLYMYXIN-DEXAMETH 3.5-10000-0.1 OP SUSP
OPHTHALMIC | Status: AC
  Filled 2013-01-11: qty 5

## 2013-01-11 MED ORDER — PHENYLEPHRINE HCL 2.5 % OP SOLN
OPHTHALMIC | Status: AC
  Filled 2013-01-11: qty 15

## 2013-01-13 ENCOUNTER — Emergency Department (HOSPITAL_COMMUNITY): Payer: Medicare Other

## 2013-01-13 ENCOUNTER — Encounter (HOSPITAL_COMMUNITY): Payer: Self-pay | Admitting: Emergency Medicine

## 2013-01-13 ENCOUNTER — Emergency Department (HOSPITAL_COMMUNITY)
Admission: EM | Admit: 2013-01-13 | Discharge: 2013-01-13 | Disposition: A | Discharge status: Home / Self Care | Payer: Medicare Other | Attending: Emergency Medicine | Admitting: Emergency Medicine

## 2013-01-13 DIAGNOSIS — Y929 Unspecified place or not applicable: Secondary | ICD-10-CM | POA: Insufficient documentation

## 2013-01-13 DIAGNOSIS — Z8709 Personal history of other diseases of the respiratory system: Secondary | ICD-10-CM | POA: Insufficient documentation

## 2013-01-13 DIAGNOSIS — M5430 Sciatica, unspecified side: Secondary | ICD-10-CM | POA: Insufficient documentation

## 2013-01-13 DIAGNOSIS — Z79899 Other long term (current) drug therapy: Secondary | ICD-10-CM | POA: Insufficient documentation

## 2013-01-13 DIAGNOSIS — W108XXA Fall (on) (from) other stairs and steps, initial encounter: Secondary | ICD-10-CM | POA: Insufficient documentation

## 2013-01-13 DIAGNOSIS — Z87891 Personal history of nicotine dependence: Secondary | ICD-10-CM | POA: Insufficient documentation

## 2013-01-13 DIAGNOSIS — I10 Essential (primary) hypertension: Secondary | ICD-10-CM | POA: Insufficient documentation

## 2013-01-13 DIAGNOSIS — Y9389 Activity, other specified: Secondary | ICD-10-CM | POA: Insufficient documentation

## 2013-01-13 DIAGNOSIS — M5432 Sciatica, left side: Secondary | ICD-10-CM

## 2013-01-13 MED ORDER — OXYCODONE-ACETAMINOPHEN 5-325 MG PO TABS: 1.0000 | ORAL_TABLET | ORAL | Status: DC | PRN

## 2013-01-13 MED ORDER — CYCLOBENZAPRINE HCL 10 MG PO TABS: 10.0000 mg | ORAL_TABLET | Freq: Three times a day (TID) | ORAL | Status: DC | PRN

## 2013-01-13 MED ORDER — NAPROXEN 500 MG PO TABS: 500.0000 mg | ORAL_TABLET | Freq: Two times a day (BID) | ORAL | Status: DC

## 2013-01-13 MED ORDER — OXYCODONE-ACETAMINOPHEN 5-325 MG PO TABS
1.0000 | ORAL_TABLET | Freq: Once | ORAL | Status: AC
  Administered 2013-01-13: 1 via ORAL
  Filled 2013-01-13: qty 1

## 2013-01-13 NOTE — ED Provider Notes (Signed)
CSN: [id number]     Arrival date & time 01/13/13  1478 History   First MD Initiated Contact with Patient 01/13/13 0932     Chief Complaint  Patient presents with  . Hip Pain   (Consider location/radiation/quality/duration/timing/severity/associated sxs/prior Treatment) Patient is a 67 y.o. male presenting with hip pain. The history is provided by the patient.  Hip Pain This is a new problem. Episode onset: two weeks. The problem occurs constantly. The problem has been unchanged. Associated symptoms include arthralgias. Pertinent negatives include no abdominal pain, chest pain, chills, fever, headaches, joint swelling, nausea, neck pain, numbness, urinary symptoms, vomiting or weakness. The symptoms are aggravated by standing and walking. Treatments tried: muscle rub. The treatment provided no relief.   Patient reports persistent left hip and buttocks pain for two weeks.  States that he fell outside and landed on his left side, but the pain to his hip and buttocks did not began until one week after the fall.  He denies hematuria, dysuria, abd pain, groin pain or numbness of the LE's.     Past Medical History  Diagnosis Date  . Hypertension   . Lung collapse    Past Surgical History  Procedure Laterality Date  . Arm surgery    . Inguinal hernia repair Right 09/09/2012    Procedure: HERNIA REPAIR INGUINAL INCARCERATED;  Surgeon: Kandis Cocking, MD;  Location: [facility] ORS;  Service: General;  Laterality: Right;  . Insertion of mesh Right 09/09/2012    Procedure: INSERTION OF MESH;  Surgeon: Kandis Cocking, MD;  Location: [facility] ORS;  Service: General;  Laterality: Right;   No family history on file. History  Substance Use Topics  . Smoking status: Former Games developer  . Smokeless tobacco: Not on file  . Alcohol Use: No    Review of Systems  Constitutional: Negative for fever and chills.  Respiratory: Negative for shortness of breath.   Cardiovascular: Negative for chest pain.  Gastrointestinal:  Negative for nausea, vomiting and abdominal pain.  Genitourinary: Negative for dysuria, frequency, hematuria, flank pain, scrotal swelling, difficulty urinating and testicular pain.  Musculoskeletal: Positive for arthralgias and back pain. Negative for joint swelling and neck pain.  Skin: Negative for color change and wound.  Neurological: Negative for weakness, numbness and headaches.  All other systems reviewed and are negative.    Allergies  Review of patient's allergies indicates no known allergies.  Home Medications   Current Outpatient Rx  Name  Route  Sig  Dispense  Refill  . amLODipine (NORVASC) 5 MG tablet   Oral   Take 10 mg by mouth daily.          . hydrochlorothiazide (HYDRODIURIL) 25 MG tablet   Oral   Take 25 mg by mouth daily.          BP 149/84  Pulse 72  Temp(Src) 97.7 F (36.5 C) (Oral)  Resp 20  Ht 5\' 4"  (1.626 m)  Wt 220 lb (99.791 kg)  BMI 37.74 kg/m2  SpO2 94%    Physical Exam  Nursing note and vitals reviewed. Constitutional: He is oriented to person, place, and time. He appears well-developed and well-nourished. No distress.  HENT:  Head: Normocephalic and atraumatic.  Cardiovascular: Normal rate, regular rhythm, normal heart sounds and intact distal pulses.   No murmur heard. Pulmonary/Chest: Effort normal and breath sounds normal. No respiratory distress. He exhibits no tenderness.  Abdominal: Soft. He exhibits no distension and no mass. There is no tenderness. There is no rebound  and no guarding.  Musculoskeletal: He exhibits tenderness.       Lumbar back: He exhibits tenderness. He exhibits normal range of motion, no swelling, no edema and no deformity.       Back:  ttp of the left lumbar paraspinal muscles and lateral left hip.  No anterior tenderness.  Pain is reproduced with internal rotation and SLR.   DP pulse is brisk,distal sensation intact.  No erythema, abrasion, bruising or bony deformity.   Neurological: He is alert and  oriented to person, place, and time. He has normal strength. No sensory deficit. He exhibits normal muscle tone. Coordination normal.  Reflex Scores:      Patellar reflexes are 2+ on the right side and 2+ on the left side.      Achilles reflexes are 2+ on the right side and 2+ on the left side. Skin: Skin is warm and dry.    ED Course  Procedures (including critical care time) Labs Review Labs Reviewed - No data to display Imaging Review Dg Lumbar Spine Complete  01/13/2013   CLINICAL DATA:  Fall, low back pain  EXAM: LUMBAR SPINE - COMPLETE 4+ VIEW  COMPARISON:  CT abdomen pelvis dated 09/08/2012  FINDINGS: Mild lumbar levocurvature.  No evidence of fracture or dislocation. Vertebral body heights are maintained.  Mild to moderate degenerative changes, most prominent at L2-3.  Visualized bony pelvis appears intact.  Vascular calcifications.  IMPRESSION: No fracture or dislocation is seen.  Mild to moderate degenerative changes.   Electronically Signed   By: Charline Bills M.D.   On: 01/13/2013 11:10   Dg Hip Complete Left  01/13/2013   CLINICAL DATA:  Fall 2 weeks ago, left hip pain  EXAM: LEFT HIP - COMPLETE 2+ VIEW  COMPARISON:  None.  FINDINGS: No fracture or dislocation is seen.  Joint spaces are essentially preserved.  Visualized bony pelvis appears intact.  IMPRESSION: No fracture or dislocation is seen.   Electronically Signed   By: Charline Bills M.D.   On: 01/13/2013 11:09    EKG Interpretation   None       MDM    Patient has ttp of the left lumbar paraspinal muscles and SI joint.  No focal neuro deficits on exam.  Ambulates with a steady gait.  No concerning sx's for emergent neurological or infectious process.  Pt agrees to symptomatic tx and close f/u with his PMD or orthopedics.  Referral info given.  Pain improved after medication and he appears stable for discharge   Ventura Hollenbeck L. Jenie Parish, PA-C 01/14/13 1600

## 2013-01-13 NOTE — ED Notes (Signed)
Pt scheduled to have cataract surgery tomorrow

## 2013-01-13 NOTE — ED Notes (Signed)
Pt states he fell going down his steps two weeks ago. Complain of pain in left hip

## 2013-01-14 ENCOUNTER — Ambulatory Visit (HOSPITAL_COMMUNITY): Payer: Medicare Other | Admitting: Anesthesiology

## 2013-01-14 ENCOUNTER — Encounter (HOSPITAL_COMMUNITY): Payer: Medicare Other | Admitting: Anesthesiology

## 2013-01-14 ENCOUNTER — Encounter (HOSPITAL_COMMUNITY): Payer: Self-pay | Admitting: *Deleted

## 2013-01-14 ENCOUNTER — Encounter (HOSPITAL_COMMUNITY)
Admission: RE | Disposition: A | Discharge status: Home / Self Care | Payer: Self-pay | Source: Ambulatory Visit | Attending: Ophthalmology

## 2013-01-14 ENCOUNTER — Ambulatory Visit (HOSPITAL_COMMUNITY)
Admission: RE | Admit: 2013-01-14 | Discharge: 2013-01-14 | Disposition: A | Discharge status: Home / Self Care | Payer: Medicare Other | Source: Ambulatory Visit | Attending: Ophthalmology | Admitting: Ophthalmology

## 2013-01-14 DIAGNOSIS — I10 Essential (primary) hypertension: Secondary | ICD-10-CM | POA: Insufficient documentation

## 2013-01-14 DIAGNOSIS — IMO0002 Reserved for concepts with insufficient information to code with codable children: Secondary | ICD-10-CM | POA: Insufficient documentation

## 2013-01-14 HISTORY — PX: CATARACT EXTRACTION W/PHACO: SHX586

## 2013-01-14 SURGERY — PHACOEMULSIFICATION, CATARACT, WITH IOL INSERTION
Anesthesia: Monitor Anesthesia Care | Site: Eye | Laterality: Right

## 2013-01-14 MED ORDER — NA HYALUR & NA CHOND-NA HYALUR 0.55-0.5 ML IO KIT
PACK | INTRAOCULAR | Status: DC | PRN
  Administered 2013-01-14: 1 via OPHTHALMIC

## 2013-01-14 MED ORDER — LACTATED RINGERS IV SOLN
INTRAVENOUS | Status: DC
  Administered 2013-01-14: 1000 mL via INTRAVENOUS

## 2013-01-14 MED ORDER — NEOMYCIN-POLYMYXIN-DEXAMETH 3.5-10000-0.1 OP SUSP
OPHTHALMIC | Status: DC | PRN
  Administered 2013-01-14: 2 [drp] via OPHTHALMIC

## 2013-01-14 MED ORDER — BSS IO SOLN
INTRAOCULAR | Status: DC | PRN
  Administered 2013-01-14: 15 mL via INTRAOCULAR

## 2013-01-14 MED ORDER — FENTANYL CITRATE 0.05 MG/ML IJ SOLN
INTRAMUSCULAR | Status: AC
  Filled 2013-01-14: qty 2

## 2013-01-14 MED ORDER — PHENYLEPHRINE HCL 2.5 % OP SOLN
1.0000 [drp] | OPHTHALMIC | Status: AC
  Administered 2013-01-14 (×3): 1 [drp] via OPHTHALMIC

## 2013-01-14 MED ORDER — EPINEPHRINE HCL 1 MG/ML IJ SOLN
INTRAOCULAR | Status: DC | PRN
  Administered 2013-01-14: 08:00:00

## 2013-01-14 MED ORDER — TRYPAN BLUE 0.06 % OP SOLN
OPHTHALMIC | Status: DC | PRN
  Administered 2013-01-14: 0.5 mL via INTRAOCULAR

## 2013-01-14 MED ORDER — LIDOCAINE HCL (PF) 1 % IJ SOLN
INTRAMUSCULAR | Status: DC | PRN
  Administered 2013-01-14: .7 mL

## 2013-01-14 MED ORDER — LIDOCAINE HCL 3.5 % OP GEL
1.0000 | Freq: Once | OPHTHALMIC | Status: AC
Start: 2013-01-14 — End: 2013-01-14
  Administered 2013-01-14: 1 via OPHTHALMIC
  Filled 2013-01-14: qty 1

## 2013-01-14 MED ORDER — LIDOCAINE HCL 3.5 % OP GEL
OPHTHALMIC | Status: AC
  Filled 2013-01-14: qty 1

## 2013-01-14 MED ORDER — ONDANSETRON HCL 4 MG/2ML IJ SOLN: 4.0000 mg | Freq: Once | INTRAMUSCULAR | Status: DC | PRN

## 2013-01-14 MED ORDER — FENTANYL CITRATE 0.05 MG/ML IJ SOLN
25.0000 ug | INTRAMUSCULAR | Status: AC
  Administered 2013-01-14 (×2): 25 ug via INTRAVENOUS

## 2013-01-14 MED ORDER — PHENYLEPHRINE HCL 2.5 % OP SOLN
OPHTHALMIC | Status: AC
  Filled 2013-01-14: qty 15

## 2013-01-14 MED ORDER — TRYPAN BLUE 0.06 % OP SOLN
OPHTHALMIC | Status: AC
  Filled 2013-01-14: qty 0.5

## 2013-01-14 MED ORDER — FENTANYL CITRATE 0.05 MG/ML IJ SOLN: 25.0000 ug | INTRAMUSCULAR | Status: DC | PRN

## 2013-01-14 MED ORDER — CYCLOPENTOLATE-PHENYLEPHRINE 0.2-1 % OP SOLN
1.0000 [drp] | OPHTHALMIC | Status: AC
  Administered 2013-01-14 (×3): 1 [drp] via OPHTHALMIC

## 2013-01-14 MED ORDER — MIDAZOLAM HCL 2 MG/2ML IJ SOLN
1.0000 mg | INTRAMUSCULAR | Status: DC | PRN
  Administered 2013-01-14: 2 mg via INTRAVENOUS
  Filled 2013-01-14: qty 2

## 2013-01-14 MED ORDER — POVIDONE-IODINE 5 % OP SOLN
OPHTHALMIC | Status: DC | PRN
  Administered 2013-01-14: 1 via OPHTHALMIC

## 2013-01-14 MED ORDER — TETRACAINE HCL 0.5 % OP SOLN
1.0000 [drp] | OPHTHALMIC | Status: AC
  Administered 2013-01-14 (×3): 1 [drp] via OPHTHALMIC

## 2013-01-14 MED ORDER — MIDAZOLAM HCL 2 MG/2ML IJ SOLN
INTRAMUSCULAR | Status: AC
  Filled 2013-01-14: qty 2

## 2013-01-14 SURGICAL SUPPLY — 32 items

## 2013-01-14 NOTE — Anesthesia Procedure Notes (Signed)
Procedure Name: MAC Date/Time: 01/14/2013 7:59 AM Performed by: Franco Nones Pre-anesthesia Checklist: Patient identified, Emergency Drugs available, Suction available, Timeout performed and Patient being monitored Patient Re-evaluated:Patient Re-evaluated prior to inductionOxygen Delivery Method: Nasal Cannula

## 2013-01-14 NOTE — Anesthesia Preprocedure Evaluation (Signed)
Anesthesia Evaluation  Patient identified by MRN, date of birth, ID band Patient awake    Reviewed: Allergy & Precautions, H&P , NPO status , Patient's Chart, lab work & pertinent test results  Airway Mallampati: III TM Distance: >3 FB Neck ROM: full    Dental no notable dental hx. (+) Teeth Intact   Pulmonary neg pulmonary ROS, former smoker,    Pulmonary exam normal       Cardiovascular hypertension,     Neuro/Psych negative neurological ROS  negative psych ROS   GI/Hepatic negative GI ROS, Neg liver ROS,   Endo/Other  negative endocrine ROS  Renal/GU negative Renal ROS  negative genitourinary   Musculoskeletal   Abdominal Normal abdominal exam  (+)   Peds  Hematology negative hematology ROS (+)   Anesthesia Other Findings   Reproductive/Obstetrics negative OB ROS                           Anesthesia Physical Anesthesia Plan  ASA: II  Anesthesia Plan: MAC   Post-op Pain Management:    Induction: Intravenous  Airway Management Planned: Nasal Cannula  Additional Equipment:   Intra-op Plan:   Post-operative Plan:   Informed Consent: I have reviewed the patients History and Physical, chart, labs and discussed the procedure including the risks, benefits and alternatives for the proposed anesthesia with the patient or authorized representative who has indicated his/her understanding and acceptance.     Plan Discussed with: CRNA and Surgeon  Anesthesia Plan Comments:         Anesthesia Quick Evaluation  

## 2013-01-14 NOTE — Anesthesia Postprocedure Evaluation (Signed)
  Anesthesia Post-op Note  Patient: Gregory [name]  Procedure(s) Performed: Procedure(s) (LRB): RIGHT EYE CATARACT EXTRACTION PHACO AND INTRAOCULAR LENS PLACEMENT  (Right)  Patient Location:  Short Stay  Anesthesia Type: MAC  Level of Consciousness: awake  Airway and Oxygen Therapy: Patient Spontanous Breathing  Post-op Pain: none  Post-op Assessment: Post-op Vital signs reviewed, Patient's Cardiovascular Status Stable, Respiratory Function Stable, Patent Airway, No signs of Nausea or vomiting and Pain level controlled  Post-op Vital Signs: Reviewed and stable  Complications: No apparent anesthesia complications

## 2013-01-14 NOTE — ED Provider Notes (Signed)
Medical screening examination/treatment/procedure(s) were performed by non-physician practitioner and as supervising physician I was immediately available for consultation/collaboration.  EKG Interpretation   None         Chananya Canizalez, MD 01/14/13 1616 

## 2013-01-14 NOTE — H&P (Signed)
I have reviewed the H&P, the patient was re-examined, and I have identified no interval changes in medical condition and plan of care since the history and physical of record  

## 2013-01-14 NOTE — Op Note (Signed)
Date of Admission: 01/14/2013  Date of Surgery: 01/14/2013  Pre-Op Dx: Cataract  Right  Eye  Post-Op Dx: Mature Cataract  Right  Eye,  Dx Code [phone]  Surgeon: Gemma Payor, M.D.  Assistants: None  Anesthesia: Topical with MAC  Indications: Painless, progressive loss of vision with compromise of daily activities.  Surgery: Cataract Extraction with Intraocular lens Implant Right Eye  Discription: The patient had dilating drops and viscous lidocaine placed into the right eye in the pre-op holding area. After transfer to the operating room, a time out was performed. The patient was then prepped and draped. Beginning with a 75 degree blade a paracentesis port was made at the surgeon's 2 o'clock position. The anterior chamber was then filled with 1% non-preserved lidocaine. This was followed by filling the anterior chamber with Vision Blue to stain the anterior capsule as there was no red reflex. The Vision Blue was displaced from the anterior chamber with BSS followed by Viscoat.  A 2.25mm keratome blade was used to make a clear corneal incision at the temporal limbus.  A bent cystatome needle and Utrata forceps were used to create a continuous tear capsulotomy. Hydrodissection was performed with balanced salt solution on a Fine canula. The lens nucleus was then removed using the phacoemulsification handpiece. Residual cortex was removed with the I&A handpiece. The anterior chamber and capsular bag were refilled with Provisc. A posterior chamber intraocular lens was placed into the capsular bag with it's injector. The implant was positioned with the Kuglan hook. The Provisc was then removed from the anterior chamber and capsular bag with the I&A handpiece. Stromal hydration of the main incision and paracentesis port was performed with BSS on a Fine canula. The wounds were tested for leak which was negative. The patient tolerated the procedure well. There were no operative complications. The patient was  then transferred to the recovery room in stable condition.  Complications: None  Specimen: None  EBL: None  Prosthetic device: B&L enVista, MX60, power 24.5, SN [id number].

## 2013-01-14 NOTE — Transfer of Care (Signed)
Immediate Anesthesia Transfer of Care Note  Patient: Gregory [name]  Procedure(s) Performed: Procedure(s) (LRB): RIGHT EYE CATARACT EXTRACTION PHACO AND INTRAOCULAR LENS PLACEMENT  (Right)  Patient Location: Shortstay  Anesthesia Type: MAC  Level of Consciousness: awake  Airway & Oxygen Therapy: Patient Spontanous Breathing   Post-op Assessment: Report given to PACU RN, Post -op Vital signs reviewed and stable and Patient moving all extremities  Post vital signs: Reviewed and stable  Complications: No apparent anesthesia complications

## 2013-01-15 ENCOUNTER — Encounter (HOSPITAL_COMMUNITY): Payer: Self-pay | Admitting: Ophthalmology

## 2013-02-04 ENCOUNTER — Encounter (HOSPITAL_COMMUNITY): Payer: Self-pay | Admitting: Pharmacy Technician

## 2013-02-11 ENCOUNTER — Encounter (HOSPITAL_COMMUNITY): Payer: Self-pay

## 2013-02-11 ENCOUNTER — Encounter (HOSPITAL_COMMUNITY)
Admission: RE | Admit: 2013-02-11 | Discharge: 2013-02-11 | Disposition: A | Discharge status: Home / Self Care | Payer: Medicare Other | Source: Ambulatory Visit | Attending: Ophthalmology | Admitting: Ophthalmology

## 2013-02-15 MED ORDER — CYCLOPENTOLATE-PHENYLEPHRINE OP SOLN OPTIME - NO CHARGE
OPHTHALMIC | Status: AC
  Filled 2013-02-15: qty 2

## 2013-02-15 MED ORDER — NEOMYCIN-POLYMYXIN-DEXAMETH 3.5-10000-0.1 OP SUSP
OPHTHALMIC | Status: AC
  Filled 2013-02-15: qty 5

## 2013-02-15 MED ORDER — LIDOCAINE HCL (PF) 1 % IJ SOLN
INTRAMUSCULAR | Status: AC
  Filled 2013-02-15: qty 2

## 2013-02-15 MED ORDER — LIDOCAINE HCL 3.5 % OP GEL
OPHTHALMIC | Status: AC
  Filled 2013-02-15: qty 1

## 2013-02-15 MED ORDER — TETRACAINE HCL 0.5 % OP SOLN
OPHTHALMIC | Status: AC
  Filled 2013-02-15: qty 2

## 2013-02-15 MED ORDER — PHENYLEPHRINE HCL 2.5 % OP SOLN
OPHTHALMIC | Status: AC
  Filled 2013-02-15: qty 15

## 2013-02-18 ENCOUNTER — Ambulatory Visit (HOSPITAL_COMMUNITY)
Admission: RE | Admit: 2013-02-18 | Discharge: 2013-02-18 | Disposition: A | Discharge status: Home / Self Care | Payer: Medicare Other | Source: Ambulatory Visit | Attending: Ophthalmology | Admitting: Ophthalmology

## 2013-02-18 ENCOUNTER — Encounter (HOSPITAL_COMMUNITY)
Admission: RE | Disposition: A | Discharge status: Home / Self Care | Payer: Self-pay | Source: Ambulatory Visit | Attending: Ophthalmology

## 2013-02-18 ENCOUNTER — Encounter (HOSPITAL_COMMUNITY): Payer: Self-pay | Admitting: Emergency Medicine

## 2013-02-18 ENCOUNTER — Encounter (HOSPITAL_COMMUNITY): Payer: Medicare Other | Admitting: Anesthesiology

## 2013-02-18 ENCOUNTER — Ambulatory Visit (HOSPITAL_COMMUNITY): Payer: Medicare Other | Admitting: Anesthesiology

## 2013-02-18 DIAGNOSIS — H2589 Other age-related cataract: Secondary | ICD-10-CM | POA: Insufficient documentation

## 2013-02-18 DIAGNOSIS — I10 Essential (primary) hypertension: Secondary | ICD-10-CM | POA: Insufficient documentation

## 2013-02-18 HISTORY — PX: CATARACT EXTRACTION W/PHACO: SHX586

## 2013-02-18 SURGERY — PHACOEMULSIFICATION, CATARACT, WITH IOL INSERTION
Anesthesia: Monitor Anesthesia Care | Site: Eye | Laterality: Left

## 2013-02-18 MED ORDER — FENTANYL CITRATE 0.05 MG/ML IJ SOLN
25.0000 ug | INTRAMUSCULAR | Status: AC
  Administered 2013-02-18 (×2): via INTRAVENOUS

## 2013-02-18 MED ORDER — MIDAZOLAM HCL 2 MG/2ML IJ SOLN
1.0000 mg | INTRAMUSCULAR | Status: DC | PRN
  Administered 2013-02-18: 2 mg via INTRAVENOUS

## 2013-02-18 MED ORDER — EPINEPHRINE HCL 1 MG/ML IJ SOLN
INTRAOCULAR | Status: DC | PRN
  Administered 2013-02-18: 11:00:00

## 2013-02-18 MED ORDER — BSS IO SOLN
INTRAOCULAR | Status: DC | PRN
  Administered 2013-02-18: 15 mL via INTRAOCULAR

## 2013-02-18 MED ORDER — MIDAZOLAM HCL 2 MG/2ML IJ SOLN
INTRAMUSCULAR | Status: AC
  Filled 2013-02-18: qty 2

## 2013-02-18 MED ORDER — PROVISC 10 MG/ML IO SOLN
INTRAOCULAR | Status: DC | PRN
  Administered 2013-02-18: 0.85 mL via INTRAOCULAR

## 2013-02-18 MED ORDER — LIDOCAINE 3.5 % OP GEL OPTIME - NO CHARGE
OPHTHALMIC | Status: DC | PRN
  Administered 2013-02-18: 2 [drp] via OPHTHALMIC

## 2013-02-18 MED ORDER — LIDOCAINE HCL (PF) 1 % IJ SOLN
INTRAMUSCULAR | Status: DC | PRN
  Administered 2013-02-18: .5 mL

## 2013-02-18 MED ORDER — CYCLOPENTOLATE-PHENYLEPHRINE 0.2-1 % OP SOLN
1.0000 [drp] | OPHTHALMIC | Status: AC
  Administered 2013-02-18 (×3): 1 [drp] via OPHTHALMIC

## 2013-02-18 MED ORDER — LACTATED RINGERS IV SOLN
INTRAVENOUS | Status: DC
  Administered 2013-02-18: 10:00:00 via INTRAVENOUS

## 2013-02-18 MED ORDER — EPINEPHRINE HCL 1 MG/ML IJ SOLN
INTRAMUSCULAR | Status: AC
Start: 2013-02-18 — End: 2013-02-18
  Filled 2013-02-18: qty 1

## 2013-02-18 MED ORDER — POVIDONE-IODINE 5 % OP SOLN
OPHTHALMIC | Status: DC | PRN
  Administered 2013-02-18: 1 via OPHTHALMIC

## 2013-02-18 MED ORDER — FENTANYL CITRATE 0.05 MG/ML IJ SOLN
INTRAMUSCULAR | Status: AC
  Filled 2013-02-18: qty 2

## 2013-02-18 MED ORDER — TETRACAINE HCL 0.5 % OP SOLN
1.0000 [drp] | OPHTHALMIC | Status: AC
  Administered 2013-02-18 (×3): 1 [drp] via OPHTHALMIC

## 2013-02-18 MED ORDER — PHENYLEPHRINE HCL 2.5 % OP SOLN
1.0000 [drp] | OPHTHALMIC | Status: AC
  Administered 2013-02-18 (×3): 1 [drp] via OPHTHALMIC

## 2013-02-18 MED ORDER — LIDOCAINE HCL 3.5 % OP GEL: 1.0000 "application " | Freq: Once | OPHTHALMIC | Status: DC

## 2013-02-18 MED ORDER — NEOMYCIN-POLYMYXIN-DEXAMETH 3.5-10000-0.1 OP SUSP
OPHTHALMIC | Status: DC | PRN
  Administered 2013-02-18: 2 [drp] via OPHTHALMIC

## 2013-02-18 SURGICAL SUPPLY — 33 items
CAPSULAR TENSION RING-AMO (OPHTHALMIC RELATED) IMPLANT
CLOTH BEACON ORANGE TIMEOUT ST (SAFETY) ×3 IMPLANT
EYE SHIELD UNIVERSAL CLEAR (GAUZE/BANDAGES/DRESSINGS) ×3 IMPLANT
GLOVE BIO SURGEON STRL SZ 6.5 (GLOVE) IMPLANT
GLOVE BIO SURGEONS STRL SZ 6.5 (GLOVE)
GLOVE BIOGEL PI IND STRL 6.5 (GLOVE) ×1 IMPLANT
GLOVE BIOGEL PI IND STRL 7.0 (GLOVE) IMPLANT
GLOVE BIOGEL PI IND STRL 7.5 (GLOVE) IMPLANT
GLOVE BIOGEL PI INDICATOR 6.5 (GLOVE) ×2
GLOVE BIOGEL PI INDICATOR 7.0 (GLOVE)
GLOVE BIOGEL PI INDICATOR 7.5 (GLOVE)
GLOVE ECLIPSE 6.5 STRL STRAW (GLOVE) IMPLANT
GLOVE ECLIPSE 7.0 STRL STRAW (GLOVE) IMPLANT
GLOVE ECLIPSE 7.5 STRL STRAW (GLOVE) IMPLANT
GLOVE EXAM NITRILE LRG STRL (GLOVE) ×3 IMPLANT
GLOVE EXAM NITRILE MD LF STRL (GLOVE) IMPLANT
GLOVE SKINSENSE NS SZ6.5 (GLOVE)
GLOVE SKINSENSE NS SZ7.0 (GLOVE)
GLOVE SKINSENSE STRL SZ6.5 (GLOVE) IMPLANT
GLOVE SKINSENSE STRL SZ7.0 (GLOVE) IMPLANT
KIT VITRECTOMY (OPHTHALMIC RELATED) IMPLANT
PAD ARMBOARD 7.5X6 YLW CONV (MISCELLANEOUS) ×3 IMPLANT
PROC W NO LENS (INTRAOCULAR LENS)
PROC W SPEC LENS (INTRAOCULAR LENS)
PROCESS W NO LENS (INTRAOCULAR LENS) IMPLANT
PROCESS W SPEC LENS (INTRAOCULAR LENS) IMPLANT
RING MALYGIN (MISCELLANEOUS) IMPLANT
SIGHTPATH CAT PROC W REG LENS (Ophthalmic Related) ×3 IMPLANT
SYR TB 1ML LL NO SAFETY (SYRINGE) ×3 IMPLANT
TAPE SURG TRANSPORE 1 IN (GAUZE/BANDAGES/DRESSINGS) ×1 IMPLANT
TAPE SURGICAL TRANSPORE 1 IN (GAUZE/BANDAGES/DRESSINGS) ×2
VISCOELASTIC ADDITIONAL (OPHTHALMIC RELATED) IMPLANT
WATER STERILE IRR 250ML POUR (IV SOLUTION) ×3 IMPLANT

## 2013-02-18 NOTE — Anesthesia Preprocedure Evaluation (Signed)
Anesthesia Evaluation  Patient identified by MRN, date of birth, ID band Patient awake    Reviewed: Allergy & Precautions, H&P , NPO status , Patient's Chart, lab work & pertinent test results  Airway Mallampati: III TM Distance: >3 FB Neck ROM: full    Dental no notable dental hx. (+) Teeth Intact   Pulmonary neg pulmonary ROS, former smoker,    Pulmonary exam normal       Cardiovascular hypertension,     Neuro/Psych negative neurological ROS  negative psych ROS   GI/Hepatic negative GI ROS, Neg liver ROS,   Endo/Other  negative endocrine ROS  Renal/GU negative Renal ROS  negative genitourinary   Musculoskeletal   Abdominal Normal abdominal exam  (+)   Peds  Hematology negative hematology ROS (+)   Anesthesia Other Findings   Reproductive/Obstetrics negative OB ROS                           Anesthesia Physical Anesthesia Plan  ASA: II  Anesthesia Plan: MAC   Post-op Pain Management:    Induction: Intravenous  Airway Management Planned: Nasal Cannula  Additional Equipment:   Intra-op Plan:   Post-operative Plan:   Informed Consent: I have reviewed the patients History and Physical, chart, labs and discussed the procedure including the risks, benefits and alternatives for the proposed anesthesia with the patient or authorized representative who has indicated his/her understanding and acceptance.     Plan Discussed with: CRNA and Surgeon  Anesthesia Plan Comments:         Anesthesia Quick Evaluation

## 2013-02-18 NOTE — Anesthesia Postprocedure Evaluation (Signed)
  Anesthesia Post-op Note  Patient: Arneta Clicheichard W Giampietro  Procedure(s) Performed: Procedure(s) with comments: CATARACT EXTRACTION PHACO AND INTRAOCULAR LENS PLACEMENT (IOC) (Left) - CDE 34.02  Patient Location: Short Stay  Anesthesia Type:MAC  Level of Consciousness: awake, alert  and oriented  Airway and Oxygen Therapy: Patient Spontanous Breathing  Post-op Pain: none  Post-op Assessment: Post-op Vital signs reviewed  Post-op Vital Signs: Reviewed and stable  Complications: No apparent anesthesia complications

## 2013-02-18 NOTE — Transfer of Care (Signed)
Immediate Anesthesia Transfer of Care Note  Patient: Gregory ClicheRichard W Geeslin  Procedure(s) Performed: Procedure(s) with comments: CATARACT EXTRACTION PHACO AND INTRAOCULAR LENS PLACEMENT (IOC) (Left) - CDE 34.02  Patient Location: Short Stay  Anesthesia Type:MAC  Level of Consciousness: awake, alert  and oriented  Airway & Oxygen Therapy: Patient Spontanous Breathing and Patient connected to face mask oxygen  Post-op Assessment: Report given to PACU RN  Post vital signs: Reviewed and stable  Complications: No apparent anesthesia complications

## 2013-02-18 NOTE — Op Note (Signed)
Date of Admission: 02/18/2013  Date of Surgery: 02/18/2013  Pre-Op Dx: Cataract  Left  Eye  Post-Op Dx: Combined Cataract  Left  Eye,  Dx Code [phone]  Surgeon: Gemma PayorKerry Jaaliyah Lucatero, M.D.  Assistants: None  Anesthesia: Topical with MAC  Indications: Painless, progressive loss of vision with compromise of daily activities.  Surgery: Cataract Extraction with Intraocular lens Implant Left Eye  Discription: The patient had dilating drops and viscous lidocaine placed into the left eye in the pre-op holding area. After transfer to the operating room, a time out was performed. The patient was then prepped and draped. Beginning with a 75 degree blade a paracentesis port was made at the surgeon's 2 o'clock position. The anterior chamber was then filled with 1% non-preserved lidocaine. This was followed by filling the anterior chamber with Provisc. A 2.194mm keratome blade was used to make a clear corneal incision at the temporal limbus. A bent cystatome needle was used to create a continuous tear capsulotomy. Hydrodissection was performed with balanced salt solution on a Fine canula. The lens nucleus was then removed using the phacoemulsification handpiece. Residual cortex was removed with the I&A handpiece. The anterior chamber and capsular bag were refilled with Provisc. A posterior chamber intraocular lens was placed into the capsular bag with it's injector. The implant was positioned with the Kuglan hook. The Provisc was then removed from the anterior chamber and capsular bag with the I&A handpiece. Stromal hydration of the main incision and paracentesis port was performed with BSS on a Fine canula. The wounds were tested for leak which was negative. The patient tolerated the procedure well. There were no operative complications. The patient was then transferred to the recovery room in stable condition.  Complications: None  Specimen: None  EBL: None  Prosthetic device: B&L enVista, MX60, power 24.5D, SN  [id number].

## 2013-02-18 NOTE — H&P (Signed)
I have reviewed the H&P, the patient was re-examined, and I have identified no interval changes in medical condition and plan of care since the history and physical of record  

## 2013-02-18 NOTE — Discharge Instructions (Signed)
Monitored Anesthesia Care  °Monitored anesthesia care is an anesthesia service for a medical procedure. Anesthesia is the loss of the ability to feel pain. It is produced by medications called anesthetics. It may affect a small area of your body (local anesthesia), a large area of your body (regional anesthesia), or your entire body (general anesthesia). The need for monitored anesthesia care depends your procedure, your condition, and the potential need for regional or general anesthesia. It is often provided during procedures where:  °· General anesthesia may be needed if there are complications. This is because you need special care when you are under general anesthesia.   °· You will be under local or regional anesthesia. This is so that you are able to have higher levels of anesthesia if needed.   °· You will receive calming medications (sedatives). This is especially the case if sedatives are given to put you in a semi-conscious state of relaxation (deep sedation). This is because the amount of sedative needed to produce this state can be hard to predict. Too much of a sedative can produce general anesthesia. °Monitored anesthesia care is performed by one or more caregivers who have special training in all types of anesthesia. You will need to meet with these caregivers before your procedure. During this meeting, they will ask you about your medical history. They will also give you instructions to follow. (For example, you will need to stop eating and drinking before your procedure. You may also need to stop or change medications you are taking.) During your procedure, your caregivers will stay with you. They will:  °· Watch your condition. This includes watching you blood pressure, breathing, and level of pain.   °· Diagnose and treat problems that occur.   °· Give medications if they are needed. These may include calming medications (sedatives) and anesthetics.   °· Make sure you are comfortable.   °Having  monitored anesthesia care does not necessarily mean that you will be under anesthesia. It does mean that your caregivers will be able to manage anesthesia if you need it or if it occurs. It also means that you will be able to have a different type of anesthesia than you are having if you need it. When your procedure is complete, your caregivers will continue to watch your condition. They will make sure any medications wear off before you are allowed to go home.  °Document Released: 10/06/2004 Document Revised: 05/07/2012 Document Reviewed: 02/22/2012 °ExitCare® Patient Information ©2014 [employer]. ° °

## 2013-02-19 ENCOUNTER — Encounter (HOSPITAL_COMMUNITY): Payer: Self-pay | Admitting: Ophthalmology

## 2013-07-11 ENCOUNTER — Other Ambulatory Visit (HOSPITAL_COMMUNITY): Payer: Self-pay | Admitting: Family Medicine

## 2013-07-11 ENCOUNTER — Ambulatory Visit (HOSPITAL_COMMUNITY)
Admission: RE | Admit: 2013-07-11 | Discharge: 2013-07-11 | Disposition: A | Discharge status: Home / Self Care | Payer: Medicare Other | Source: Ambulatory Visit | Attending: Family Medicine | Admitting: Family Medicine

## 2013-07-11 DIAGNOSIS — Z87891 Personal history of nicotine dependence: Secondary | ICD-10-CM

## 2013-07-11 DIAGNOSIS — R0602 Shortness of breath: Secondary | ICD-10-CM

## 2013-07-11 DIAGNOSIS — Z0000 Encounter for general adult medical examination without abnormal findings: Secondary | ICD-10-CM

## 2013-07-11 DIAGNOSIS — M479 Spondylosis, unspecified: Secondary | ICD-10-CM | POA: Insufficient documentation

## 2013-07-16 ENCOUNTER — Ambulatory Visit (HOSPITAL_COMMUNITY)
Admission: RE | Admit: 2013-07-16 | Discharge: 2013-07-16 | Disposition: A | Discharge status: Home / Self Care | Payer: Medicare Other | Source: Ambulatory Visit | Attending: Family Medicine | Admitting: Family Medicine

## 2013-07-16 DIAGNOSIS — R0602 Shortness of breath: Secondary | ICD-10-CM

## 2013-07-16 DIAGNOSIS — Z87891 Personal history of nicotine dependence: Secondary | ICD-10-CM | POA: Insufficient documentation

## 2013-07-16 DIAGNOSIS — Z0000 Encounter for general adult medical examination without abnormal findings: Secondary | ICD-10-CM

## 2013-07-16 DIAGNOSIS — I10 Essential (primary) hypertension: Secondary | ICD-10-CM | POA: Insufficient documentation

## 2013-08-27 ENCOUNTER — Ambulatory Visit (INDEPENDENT_AMBULATORY_CARE_PROVIDER_SITE_OTHER): Payer: Medicare Other | Admitting: Urology

## 2013-08-27 DIAGNOSIS — N529 Male erectile dysfunction, unspecified: Secondary | ICD-10-CM

## 2013-08-27 DIAGNOSIS — R32 Unspecified urinary incontinence: Secondary | ICD-10-CM

## 2013-09-27 ENCOUNTER — Emergency Department (HOSPITAL_COMMUNITY): Payer: Medicare Other

## 2013-09-27 ENCOUNTER — Encounter (HOSPITAL_COMMUNITY): Payer: Self-pay | Admitting: Emergency Medicine

## 2013-09-27 ENCOUNTER — Emergency Department (HOSPITAL_COMMUNITY)
Admission: EM | Admit: 2013-09-27 | Discharge: 2013-09-27 | Disposition: A | Discharge status: Home / Self Care | Payer: Medicare Other | Attending: Emergency Medicine | Admitting: Emergency Medicine

## 2013-09-27 DIAGNOSIS — M109 Gout, unspecified: Secondary | ICD-10-CM | POA: Diagnosis not present

## 2013-09-27 DIAGNOSIS — Z79899 Other long term (current) drug therapy: Secondary | ICD-10-CM | POA: Insufficient documentation

## 2013-09-27 DIAGNOSIS — Z8709 Personal history of other diseases of the respiratory system: Secondary | ICD-10-CM | POA: Diagnosis not present

## 2013-09-27 DIAGNOSIS — Z87891 Personal history of nicotine dependence: Secondary | ICD-10-CM | POA: Diagnosis not present

## 2013-09-27 DIAGNOSIS — I10 Essential (primary) hypertension: Secondary | ICD-10-CM | POA: Diagnosis not present

## 2013-09-27 DIAGNOSIS — R52 Pain, unspecified: Secondary | ICD-10-CM | POA: Diagnosis not present

## 2013-09-27 DIAGNOSIS — M25569 Pain in unspecified knee: Secondary | ICD-10-CM | POA: Insufficient documentation

## 2013-09-27 DIAGNOSIS — M25562 Pain in left knee: Secondary | ICD-10-CM

## 2013-09-27 HISTORY — DX: Gout, unspecified: M10.9

## 2013-09-27 LAB — CBC WITH DIFFERENTIAL/PLATELET
Basophils Absolute: 0.1 10*3/uL (ref 0.0–0.1)
Basophils Relative: 0 % (ref 0–1)
EOS ABS: 0.3 10*3/uL (ref 0.0–0.7)
Eosinophils Relative: 2 % (ref 0–5)
HCT: 46.2 % (ref 39.0–52.0)
Hemoglobin: 15.5 g/dL (ref 13.0–17.0)
LYMPHS ABS: 4.3 10*3/uL — AB (ref 0.7–4.0)
LYMPHS PCT: 34 % (ref 12–46)
MCH: 29.8 pg (ref 26.0–34.0)
MCHC: 33.5 g/dL (ref 30.0–36.0)
MCV: 88.8 fL (ref 78.0–100.0)
Monocytes Absolute: 1 10*3/uL (ref 0.1–1.0)
Monocytes Relative: 8 % (ref 3–12)
NEUTROS PCT: 56 % (ref 43–77)
Neutro Abs: 7 10*3/uL (ref 1.7–7.7)
PLATELETS: 254 10*3/uL (ref 150–400)
RBC: 5.2 MIL/uL (ref 4.22–5.81)
RDW: 14.9 % (ref 11.5–15.5)
WBC: 12.6 10*3/uL — AB (ref 4.0–10.5)

## 2013-09-27 LAB — URIC ACID: URIC ACID, SERUM: 4.8 mg/dL (ref 4.0–7.8)

## 2013-09-27 MED ORDER — OXYCODONE-ACETAMINOPHEN 5-325 MG PO TABS
1.0000 | ORAL_TABLET | Freq: Once | ORAL | Status: AC
  Administered 2013-09-27: 1 via ORAL
  Filled 2013-09-27: qty 1

## 2013-09-27 MED ORDER — HYDROCODONE-ACETAMINOPHEN 5-325 MG PO TABS: ORAL_TABLET | ORAL | Status: DC

## 2013-09-27 MED ORDER — PREDNISONE 10 MG PO TABS: ORAL_TABLET | ORAL | Status: DC

## 2013-09-27 NOTE — Discharge Instructions (Signed)
Arthritis, Nonspecific °Arthritis is pain, redness, warmth, or puffiness (inflammation) of a joint. The joint may be stiff or hurt when you move it. One or more joints may be affected. There are many types of arthritis. Your doctor may not know what type you have right away. The most common cause of arthritis is wear and tear on the joint (osteoarthritis). °HOME CARE  °· Only take medicine as told by your doctor. °· Rest the joint as much as possible. °· Raise (elevate) your joint if it is puffy. °· Use crutches if the painful joint is in your leg. °· Drink enough fluids to keep your pee (urine) clear or pale yellow. °· Follow your doctor's diet instructions. °· Use cold packs for very bad joint pain for 10 to 15 minutes every hour. Ask your doctor if it is okay for you to use hot packs. °· Exercise as told by your doctor. °· Take a warm shower if you have stiffness in the morning. °· Move your sore joints throughout the day. °GET HELP RIGHT AWAY IF:  °· You have a fever. °· You have very bad joint pain, puffiness, or redness. °· You have many joints that are painful and puffy. °· You are not getting better with treatment. °· You have very bad back pain or leg weakness. °· You cannot control when you poop (bowel movement) or pee (urinate). °· You do not feel better in 24 hours or are getting worse. °· You are having side effects from your medicine. °MAKE SURE YOU:  °· Understand these instructions. °· Will watch your condition. °· Will get help right away if you are not doing well or get worse. °Document Released: 04/06/2009 Document Revised: 07/12/2011 Document Reviewed: 04/06/2009 °[employer]® Patient Information ©2015 [employer]. This information is not intended to replace advice given to you by your health care provider. Make sure you discuss any questions you have with your health care provider. ° °

## 2013-09-27 NOTE — ED Provider Notes (Signed)
Medical screening examination/treatment/procedure(s) were performed by non-physician practitioner and as supervising physician I was immediately available for consultation/collaboration.   EKG Interpretation None        Benny Lennert, MD 09/27/13 [phone]

## 2013-09-27 NOTE — ED Notes (Signed)
Patient complaining of swelling and pain in left knee since yesterday. States "It started hurting yesterday, and this morning I was walking and felt a pop in my knee and almost fell to the ground."

## 2013-09-27 NOTE — ED Provider Notes (Signed)
CSN: [id number]     Arrival date & time 09/27/13  4098 History   First MD Initiated Contact with Patient 09/27/13 [phone]     Chief Complaint  Patient presents with  . Knee Pain   Gregory [name] is a 68 y.o. male who presents to the Emergency Department complaining of left knee pain and swelling for one day. States pain is worse with weightbearing and improves somewhat with rest. Patient states this morning he was walking to the kitchen and felt his knee" pop" he states the pain has been progressively worse since that time. He denies fall, fever, chills, redness to the joint or numbness to her left lower extremity. He also denies radiation of pain. He has taken Tylenol this morning without relief.  (Consider location/radiation/quality/duration/timing/severity/associated sxs/prior Treatment) Patient is a 68 y.o. male presenting with knee pain.  Knee Pain Associated symptoms: no back pain and no fever     Past Medical History  Diagnosis Date  . Hypertension   . Lung collapse   . Gout    Past Surgical History  Procedure Laterality Date  . Arm surgery    . Inguinal hernia repair Right 09/09/2012    Procedure: HERNIA REPAIR INGUINAL INCARCERATED;  Surgeon: Kandis Cocking, MD;  Location: [facility];  Service: General;  Laterality: Right;  . Insertion of mesh Right 09/09/2012    Procedure: INSERTION OF MESH;  Surgeon: Kandis Cocking, MD;  Location: [facility];  Service: General;  Laterality: Right;  . Cataract extraction w/phaco Right 01/14/2013    Procedure: RIGHT EYE CATARACT EXTRACTION PHACO AND INTRAOCULAR LENS PLACEMENT ;  Surgeon: Gemma Payor, MD;  Location: [facility];  Service: Ophthalmology;  Laterality: Right;  CDE 54.97  . Cataract extraction w/phaco Left 02/18/2013    Procedure: CATARACT EXTRACTION PHACO AND INTRAOCULAR LENS PLACEMENT (IOC);  Surgeon: Gemma Payor, MD;  Location: [facility];  Service: Ophthalmology;  Laterality: Left;  CDE 34.02   History reviewed. No pertinent family history. History   Substance Use Topics  . Smoking status: Former Games developer  . Smokeless tobacco: Not on file  . Alcohol Use: No    Review of Systems  Constitutional: Negative for fever and chills.  Genitourinary: Negative for dysuria and difficulty urinating.  Musculoskeletal: Positive for arthralgias and joint swelling. Negative for back pain.  Skin: Negative for color change, rash and wound.  All other systems reviewed and are negative.     Allergies  Review of patient's allergies indicates no known allergies.  Home Medications   Prior to Admission medications   Medication Sig Start Date End Date Taking? Authorizing Provider  acetaminophen (TYLENOL) 500 MG tablet Take 1,000 mg by mouth every 6 (six) hours as needed for mild pain.   Yes Historical Provider, MD  allopurinol (ZYLOPRIM) 300 MG tablet Take 300 mg by mouth daily. 09/25/13  Yes Historical Provider, MD  amLODipine (NORVASC) 10 MG tablet Take 10 mg by mouth daily.   Yes Historical Provider, MD  indomethacin (INDOCIN) 25 MG capsule Take 25 mg by mouth 3 (three) times daily as needed.   Yes Historical Provider, MD  lisinopril (PRINIVIL,ZESTRIL) 30 MG tablet Take 30 mg by mouth daily.   Yes Historical Provider, MD   BP 149/79  Pulse 72  Temp(Src) 97.9 F (36.6 C) (Oral)  Resp 18  Ht  (1.626 m)  Wt 230 lb (104.327 kg)  BMI 39.46 kg/m2  SpO2 96% Physical Exam  Nursing note and vitals reviewed. Constitutional: He is oriented to  person, place, and time. He appears well-developed and well-nourished. No distress.  HENT:  Head: Normocephalic and atraumatic.  Cardiovascular: Normal rate, regular rhythm, normal heart sounds and intact distal pulses.   No murmur heard. Pulmonary/Chest: Effort normal and breath sounds normal. No respiratory distress.  Musculoskeletal: He exhibits edema and tenderness.       Left knee: He exhibits swelling. He exhibits no effusion, no ecchymosis, no laceration, no erythema, normal alignment and no bony  tenderness. Tenderness found. Medial joint line and patellar tendon tenderness noted.       Legs: ttp of the anterior, medial knee.  Moderate patella crepitus present.  No erythema, effusion, or step-off deformity.  DP pulse brisk, distal sensation intact. Calf is soft and NT.  Neurological: He is alert and oriented to person, place, and time. He exhibits normal muscle tone. Coordination normal.  Skin: Skin is warm and dry. No erythema.    ED Course  Procedures (including critical care time) Labs Review Labs Reviewed  CBC WITH DIFFERENTIAL - Abnormal; Notable for the following:    WBC 12.6 (*)    Lymphs Abs 4.3 (*)    All other components within normal limits  URIC ACID    Imaging Review Dg Knee Complete 4 Views Left  09/27/2013   CLINICAL DATA:  Left knee pain with no injury, swelling  EXAM: LEFT KNEE - COMPLETE 4+ VIEW  COMPARISON:  None.  FINDINGS: Minimal narrowing of the medial compartment. No significant osteophyte formation. Study is otherwise negative.  IMPRESSION: No acute abnormalities.  Minimal degenerative change.   Electronically Signed   By: Esperanza Heir M.D.   On: 09/27/2013 09:36     EKG Interpretation None      MDM   Final diagnoses:  Knee pain, acute, left    ACE wrap applied for comfort, use precautions given.  Pt has a cane at home.   Pt is well appearing.  No concerning sx's for septic joint. NV intact.   Uric acid level is wnml.  compartments of the LE are soft.  Pt agrees to d/c his indocin, will rx pain medication and prednisone taper and he agrees to close orthopedic f/u.       Sherle Mello L. Haily Caley, PA-C 09/27/13 1043

## 2013-10-29 ENCOUNTER — Ambulatory Visit: Payer: Medicare Other | Admitting: Urology

## 2014-02-17 ENCOUNTER — Other Ambulatory Visit (HOSPITAL_COMMUNITY): Payer: Self-pay | Admitting: Respiratory Therapy

## 2014-02-17 DIAGNOSIS — R0602 Shortness of breath: Secondary | ICD-10-CM

## 2014-02-18 ENCOUNTER — Ambulatory Visit (HOSPITAL_COMMUNITY)
Admission: RE | Admit: 2014-02-18 | Discharge: 2014-02-18 | Disposition: A | Discharge status: Home / Self Care | Payer: Medicare Other | Source: Ambulatory Visit | Attending: Pulmonary Disease | Admitting: Pulmonary Disease

## 2014-02-18 ENCOUNTER — Other Ambulatory Visit (HOSPITAL_COMMUNITY): Payer: Self-pay | Admitting: Pulmonary Disease

## 2014-02-18 DIAGNOSIS — R0602 Shortness of breath: Secondary | ICD-10-CM | POA: Insufficient documentation

## 2014-02-18 DIAGNOSIS — I517 Cardiomegaly: Secondary | ICD-10-CM | POA: Diagnosis not present

## 2014-02-21 ENCOUNTER — Ambulatory Visit (HOSPITAL_COMMUNITY): Admission: RE | Admit: 2014-02-21 | Payer: Medicare Other | Source: Ambulatory Visit

## 2014-02-28 ENCOUNTER — Ambulatory Visit (HOSPITAL_COMMUNITY)
Admission: RE | Admit: 2014-02-28 | Discharge: 2014-02-28 | Disposition: A | Discharge status: Home / Self Care | Payer: Medicare Other | Source: Ambulatory Visit | Attending: Pulmonary Disease | Admitting: Pulmonary Disease

## 2014-02-28 DIAGNOSIS — R0602 Shortness of breath: Secondary | ICD-10-CM | POA: Diagnosis present

## 2014-02-28 MED ORDER — ALBUTEROL SULFATE (2.5 MG/3ML) 0.083% IN NEBU
2.5000 mg | INHALATION_SOLUTION | Freq: Once | RESPIRATORY_TRACT | Status: AC
  Administered 2014-02-28: 2.5 mg via RESPIRATORY_TRACT

## 2014-03-03 LAB — PULMONARY FUNCTION TEST
DL/VA % PRED: 135 %
DL/VA: 5.67 ml/min/mmHg/L
DLCO cor % pred: 98 %
DLCO cor: 23.96 ml/min/mmHg
DLCO unc % pred: 98 %
DLCO unc: 23.96 ml/min/mmHg
FEF 25-75 PRE: 2.71 L/s
FEF 25-75 Post: 3.43 L/sec
FEF2575-%CHANGE-POST: 26 %
FEF2575-%Pred-Post: 162 %
FEF2575-%Pred-Pre: 127 %
FEV1-%CHANGE-POST: 4 %
FEV1-%PRED-POST: 107 %
FEV1-%Pred-Pre: 103 %
FEV1-Post: 2.86 L
FEV1-Pre: 2.74 L
FEV1FVC-%Change-Post: 4 %
FEV1FVC-%Pred-Pre: 108 %
FEV6-%CHANGE-POST: 0 %
FEV6-%PRED-POST: 101 %
FEV6-%PRED-PRE: 101 %
FEV6-PRE: 3.43 L
FEV6-Post: 3.43 L
FEV6FVC-%Change-Post: 0 %
FEV6FVC-%Pred-Post: 106 %
FEV6FVC-%Pred-Pre: 106 %
FVC-%Change-Post: 0 %
FVC-%PRED-POST: 95 %
FVC-%PRED-PRE: 95 %
FVC-Post: 3.43 L
FVC-Pre: 3.43 L
POST FEV1/FVC RATIO: 83 %
POST FEV6/FVC RATIO: 100 %
Pre FEV1/FVC ratio: 80 %
Pre FEV6/FVC Ratio: 100 %
RV % PRED: 125 %
RV: 2.56 L
TLC % pred: 99 %
TLC: 5.8 L

## 2014-07-04 ENCOUNTER — Other Ambulatory Visit (HOSPITAL_COMMUNITY): Payer: Self-pay | Admitting: Family Medicine

## 2014-07-04 DIAGNOSIS — Z139 Encounter for screening, unspecified: Secondary | ICD-10-CM

## 2014-07-04 DIAGNOSIS — F17290 Nicotine dependence, other tobacco product, uncomplicated: Secondary | ICD-10-CM

## 2014-07-09 ENCOUNTER — Ambulatory Visit (HOSPITAL_COMMUNITY): Admission: RE | Admit: 2014-07-09 | Payer: Medicare Other | Source: Ambulatory Visit

## 2014-10-02 ENCOUNTER — Other Ambulatory Visit (HOSPITAL_COMMUNITY): Payer: Self-pay | Admitting: Family Medicine

## 2014-10-02 DIAGNOSIS — M5136 Other intervertebral disc degeneration, lumbar region: Secondary | ICD-10-CM

## 2014-10-02 DIAGNOSIS — M545 Low back pain: Secondary | ICD-10-CM

## 2014-10-03 ENCOUNTER — Ambulatory Visit (HOSPITAL_COMMUNITY)
Admission: RE | Admit: 2014-10-03 | Discharge: 2014-10-03 | Disposition: A | Discharge status: Home / Self Care | Payer: Medicare Other | Source: Ambulatory Visit | Attending: Family Medicine | Admitting: Family Medicine

## 2014-10-03 DIAGNOSIS — M545 Low back pain: Secondary | ICD-10-CM | POA: Diagnosis not present

## 2014-10-03 DIAGNOSIS — M542 Cervicalgia: Secondary | ICD-10-CM | POA: Diagnosis not present

## 2014-10-03 DIAGNOSIS — M5136 Other intervertebral disc degeneration, lumbar region: Secondary | ICD-10-CM

## 2014-10-03 DIAGNOSIS — M546 Pain in thoracic spine: Secondary | ICD-10-CM | POA: Diagnosis not present

## 2014-11-25 DIAGNOSIS — I252 Old myocardial infarction: Secondary | ICD-10-CM

## 2014-11-25 HISTORY — DX: Old myocardial infarction: I25.2

## 2014-12-01 ENCOUNTER — Institutional Professional Consult (permissible substitution): Payer: Self-pay | Admitting: Neurology

## 2014-12-04 ENCOUNTER — Inpatient Hospital Stay (HOSPITAL_COMMUNITY)
Admission: EM | Admit: 2014-12-04 | Discharge: 2014-12-06 | DRG: 282 | Disposition: A | Discharge status: Home / Self Care | Payer: Medicare Other | Attending: Cardiovascular Disease | Admitting: Cardiovascular Disease

## 2014-12-04 ENCOUNTER — Encounter (HOSPITAL_COMMUNITY): Payer: Self-pay

## 2014-12-04 ENCOUNTER — Emergency Department (HOSPITAL_COMMUNITY): Payer: Medicare Other

## 2014-12-04 DIAGNOSIS — I249 Acute ischemic heart disease, unspecified: Secondary | ICD-10-CM | POA: Insufficient documentation

## 2014-12-04 DIAGNOSIS — R7989 Other specified abnormal findings of blood chemistry: Secondary | ICD-10-CM | POA: Diagnosis not present

## 2014-12-04 DIAGNOSIS — Z87891 Personal history of nicotine dependence: Secondary | ICD-10-CM

## 2014-12-04 DIAGNOSIS — Z7951 Long term (current) use of inhaled steroids: Secondary | ICD-10-CM | POA: Diagnosis not present

## 2014-12-04 DIAGNOSIS — Z79899 Other long term (current) drug therapy: Secondary | ICD-10-CM

## 2014-12-04 DIAGNOSIS — Z23 Encounter for immunization: Secondary | ICD-10-CM

## 2014-12-04 DIAGNOSIS — E785 Hyperlipidemia, unspecified: Secondary | ICD-10-CM | POA: Diagnosis present

## 2014-12-04 DIAGNOSIS — E782 Mixed hyperlipidemia: Secondary | ICD-10-CM

## 2014-12-04 DIAGNOSIS — I2510 Atherosclerotic heart disease of native coronary artery without angina pectoris: Secondary | ICD-10-CM | POA: Diagnosis present

## 2014-12-04 DIAGNOSIS — I10 Essential (primary) hypertension: Secondary | ICD-10-CM | POA: Diagnosis present

## 2014-12-04 DIAGNOSIS — I214 Non-ST elevation (NSTEMI) myocardial infarction: Secondary | ICD-10-CM | POA: Diagnosis present

## 2014-12-04 DIAGNOSIS — Z9841 Cataract extraction status, right eye: Secondary | ICD-10-CM | POA: Diagnosis not present

## 2014-12-04 DIAGNOSIS — N289 Disorder of kidney and ureter, unspecified: Secondary | ICD-10-CM | POA: Diagnosis not present

## 2014-12-04 DIAGNOSIS — M109 Gout, unspecified: Secondary | ICD-10-CM | POA: Diagnosis not present

## 2014-12-04 DIAGNOSIS — Z9842 Cataract extraction status, left eye: Secondary | ICD-10-CM

## 2014-12-04 DIAGNOSIS — Z961 Presence of intraocular lens: Secondary | ICD-10-CM | POA: Diagnosis not present

## 2014-12-04 DIAGNOSIS — R778 Other specified abnormalities of plasma proteins: Secondary | ICD-10-CM | POA: Insufficient documentation

## 2014-12-04 DIAGNOSIS — R079 Chest pain, unspecified: Secondary | ICD-10-CM | POA: Diagnosis present

## 2014-12-04 HISTORY — DX: Essential (primary) hypertension: I10

## 2014-12-04 HISTORY — DX: Pneumothorax, unspecified: J93.9

## 2014-12-04 LAB — CBC WITH DIFFERENTIAL/PLATELET
BASOS ABS: 0.1 10*3/uL (ref 0.0–0.1)
Basophils Relative: 1 %
EOS PCT: 6 %
Eosinophils Absolute: 0.8 10*3/uL — ABNORMAL HIGH (ref 0.0–0.7)
HEMATOCRIT: 46.6 % (ref 39.0–52.0)
Hemoglobin: 15.6 g/dL (ref 13.0–17.0)
LYMPHS ABS: 5 10*3/uL — AB (ref 0.7–4.0)
LYMPHS PCT: 39 %
MCH: 29.9 pg (ref 26.0–34.0)
MCHC: 33.5 g/dL (ref 30.0–36.0)
MCV: 89.4 fL (ref 78.0–100.0)
Monocytes Absolute: 0.8 10*3/uL (ref 0.1–1.0)
Monocytes Relative: 6 %
NEUTROS ABS: 6.3 10*3/uL (ref 1.7–7.7)
Neutrophils Relative %: 48 %
Platelets: 295 10*3/uL (ref 150–400)
RBC: 5.21 MIL/uL (ref 4.22–5.81)
RDW: 14.5 % (ref 11.5–15.5)
WBC: 12.9 10*3/uL — AB (ref 4.0–10.5)

## 2014-12-04 LAB — BASIC METABOLIC PANEL
Anion gap: 9 (ref 5–15)
BUN: 17 mg/dL (ref 6–20)
CHLORIDE: 103 mmol/L (ref 101–111)
CO2: 25 mmol/L (ref 22–32)
CREATININE: 1.13 mg/dL (ref 0.61–1.24)
Calcium: 9.3 mg/dL (ref 8.9–10.3)
GFR calc Af Amer: >60 mL/min (ref >60)
GFR calc non Af Amer: >60 mL/min (ref >60)
Glucose, Bld: 144 mg/dL — ABNORMAL HIGH (ref 65–99)
POTASSIUM: 4.1 mmol/L (ref 3.5–5.1)
SODIUM: 137 mmol/L (ref 135–145)

## 2014-12-04 LAB — TROPONIN I
Troponin I: 0.19 ng/mL — ABNORMAL HIGH (ref <0.031)
Troponin I: 2.88 ng/mL (ref <0.031)

## 2014-12-04 LAB — MRSA PCR SCREENING: MRSA BY PCR: NEGATIVE

## 2014-12-04 MED ORDER — ACETAMINOPHEN 325 MG PO TABS: 650.0000 mg | ORAL_TABLET | ORAL | Status: DC | PRN

## 2014-12-04 MED ORDER — ASPIRIN EC 81 MG PO TBEC: 81.0000 mg | DELAYED_RELEASE_TABLET | Freq: Every day | ORAL | Status: DC

## 2014-12-04 MED ORDER — ASPIRIN 300 MG RE SUPP: 300.0000 mg | RECTAL | Status: AC

## 2014-12-04 MED ORDER — LISINOPRIL 20 MG PO TABS: 40.0000 mg | ORAL_TABLET | Freq: Every day | ORAL | Status: DC

## 2014-12-04 MED ORDER — NITROGLYCERIN 0.4 MG SL SUBL: 0.4000 mg | SUBLINGUAL_TABLET | SUBLINGUAL | Status: DC | PRN

## 2014-12-04 MED ORDER — AZITHROMYCIN 250 MG PO TABS
250.0000 mg | ORAL_TABLET | Freq: Every day | ORAL | Status: DC
Start: 2014-12-04 — End: 2014-12-06
  Administered 2014-12-05 – 2014-12-06 (×2): 250 mg via ORAL
  Filled 2014-12-04 (×2): qty 1

## 2014-12-04 MED ORDER — ASPIRIN 325 MG PO TABS
325.0000 mg | ORAL_TABLET | Freq: Once | ORAL | Status: AC
  Administered 2014-12-04: 325 mg via ORAL
  Filled 2014-12-04: qty 1

## 2014-12-04 MED ORDER — ALLOPURINOL 300 MG PO TABS
300.0000 mg | ORAL_TABLET | Freq: Every day | ORAL | Status: DC
  Administered 2014-12-05 – 2014-12-06 (×2): 300 mg via ORAL
  Filled 2014-12-04 (×2): qty 1

## 2014-12-04 MED ORDER — TRAMADOL HCL 50 MG PO TABS: 50.0000 mg | ORAL_TABLET | Freq: Four times a day (QID) | ORAL | Status: DC | PRN

## 2014-12-04 MED ORDER — LORATADINE 10 MG PO TABS
10.0000 mg | ORAL_TABLET | Freq: Every day | ORAL | Status: DC
  Administered 2014-12-05 – 2014-12-06 (×2): 10 mg via ORAL
  Filled 2014-12-04 (×2): qty 1

## 2014-12-04 MED ORDER — SODIUM CHLORIDE 0.9 % IV SOLN
INTRAVENOUS | Status: DC
Start: 2014-12-05 — End: 2014-12-05
  Administered 2014-12-05: 06:00:00 via INTRAVENOUS

## 2014-12-04 MED ORDER — ASPIRIN 81 MG PO CHEW
81.0000 mg | CHEWABLE_TABLET | ORAL | Status: AC
  Administered 2014-12-05: 81 mg via ORAL
  Filled 2014-12-04: qty 1

## 2014-12-04 MED ORDER — KETOROLAC TROMETHAMINE 60 MG/2ML IM SOLN
INTRAMUSCULAR | Status: AC
  Filled 2014-12-04: qty 2

## 2014-12-04 MED ORDER — MORPHINE SULFATE (PF) 2 MG/ML IV SOLN
2.0000 mg | Freq: Once | INTRAVENOUS | Status: AC
  Administered 2014-12-04: 2 mg via INTRAVENOUS

## 2014-12-04 MED ORDER — SODIUM CHLORIDE 0.9 % IJ SOLN: 3.0000 mL | INTRAMUSCULAR | Status: DC | PRN

## 2014-12-04 MED ORDER — HEPARIN (PORCINE) IN NACL 100-0.45 UNIT/ML-% IJ SOLN
1300.0000 [IU]/h | INTRAMUSCULAR | Status: DC
  Administered 2014-12-04 – 2014-12-05 (×2): 1300 [IU]/h via INTRAVENOUS
  Filled 2014-12-04 (×2): qty 250

## 2014-12-04 MED ORDER — FENTANYL CITRATE (PF) 100 MCG/2ML IJ SOLN
INTRAMUSCULAR | Status: AC
  Filled 2014-12-04: qty 2

## 2014-12-04 MED ORDER — MELOXICAM 7.5 MG PO TABS
7.5000 mg | ORAL_TABLET | Freq: Two times a day (BID) | ORAL | Status: DC
  Administered 2014-12-04: 7.5 mg via ORAL
  Filled 2014-12-04 (×3): qty 1

## 2014-12-04 MED ORDER — METOPROLOL TARTRATE 12.5 MG HALF TABLET
12.5000 mg | ORAL_TABLET | Freq: Two times a day (BID) | ORAL | Status: DC
  Administered 2014-12-04 – 2014-12-06 (×4): 12.5 mg via ORAL
  Filled 2014-12-04 (×4): qty 1

## 2014-12-04 MED ORDER — HEPARIN BOLUS VIA INFUSION
4000.0000 [IU] | Freq: Once | INTRAVENOUS | Status: AC
  Administered 2014-12-04: 4000 [IU] via INTRAVENOUS

## 2014-12-04 MED ORDER — NITROGLYCERIN IN D5W 200-5 MCG/ML-% IV SOLN
5.0000 ug/min | Freq: Once | INTRAVENOUS | Status: AC
  Administered 2014-12-04: 5 ug/min via INTRAVENOUS
  Filled 2014-12-04: qty 250

## 2014-12-04 MED ORDER — ONDANSETRON HCL 4 MG/2ML IJ SOLN: 4.0000 mg | Freq: Four times a day (QID) | INTRAMUSCULAR | Status: DC | PRN

## 2014-12-04 MED ORDER — MORPHINE SULFATE (PF) 2 MG/ML IV SOLN: 2.0000 mg | Freq: Once | INTRAVENOUS | Status: AC

## 2014-12-04 MED ORDER — AMLODIPINE BESYLATE 10 MG PO TABS
10.0000 mg | ORAL_TABLET | Freq: Every day | ORAL | Status: DC
  Administered 2014-12-05 – 2014-12-06 (×2): 10 mg via ORAL
  Filled 2014-12-04 (×2): qty 1

## 2014-12-04 MED ORDER — SODIUM CHLORIDE 0.9 % IV SOLN: 250.0000 mL | INTRAVENOUS | Status: DC | PRN

## 2014-12-04 MED ORDER — FENTANYL CITRATE (PF) 100 MCG/2ML IJ SOLN: 50.0000 ug | Freq: Once | INTRAMUSCULAR | Status: DC

## 2014-12-04 MED ORDER — KETOROLAC TROMETHAMINE 60 MG/2ML IM SOLN
60.0000 mg | Freq: Once | INTRAMUSCULAR | Status: AC
Start: 2014-12-04 — End: 2014-12-04
  Administered 2014-12-04: 60 mg via INTRAMUSCULAR

## 2014-12-04 MED ORDER — MORPHINE SULFATE (PF) 2 MG/ML IV SOLN
INTRAVENOUS | Status: AC
  Filled 2014-12-04: qty 1

## 2014-12-04 MED ORDER — ENSURE ENLIVE PO LIQD: 237.0000 mL | Freq: Two times a day (BID) | ORAL | Status: DC

## 2014-12-04 MED ORDER — ASPIRIN 81 MG PO CHEW: 324.0000 mg | CHEWABLE_TABLET | ORAL | Status: AC

## 2014-12-04 MED ORDER — BUDESONIDE-FORMOTEROL FUMARATE 80-4.5 MCG/ACT IN AERO
2.0000 | INHALATION_SPRAY | Freq: Two times a day (BID) | RESPIRATORY_TRACT | Status: DC
  Administered 2014-12-04 – 2014-12-06 (×4): 2 via RESPIRATORY_TRACT
  Filled 2014-12-04 (×2): qty 6.9

## 2014-12-04 MED ORDER — ATORVASTATIN CALCIUM 40 MG PO TABS: 40.0000 mg | ORAL_TABLET | Freq: Every day | ORAL | Status: DC

## 2014-12-04 MED ORDER — BENZONATATE 100 MG PO CAPS
100.0000 mg | ORAL_CAPSULE | Freq: Every evening | ORAL | Status: DC | PRN
Start: 2014-12-04 — End: 2014-12-06

## 2014-12-04 MED ORDER — SODIUM CHLORIDE 0.9 % IJ SOLN
3.0000 mL | Freq: Two times a day (BID) | INTRAMUSCULAR | Status: DC
  Administered 2014-12-04: 3 mL via INTRAVENOUS

## 2014-12-04 NOTE — Consult Note (Signed)
Consulting cardiologist: Dr. Jonelle SidleSamuel G. Siriyah Ambrosius Requesting provider: Dr. Donnetta HutchingBrian Cook Primary provider: Gena Frayr.Tejan-Sie Sheikh [facility]([place])  Reason for consultation: Chest pain and abnormal troponin I  Clinical Summary [name] is a 69 y.o.male past medical history outlined below, presented to the ER earlier this morning after developing moderately intense chest tightness at rest. Symptoms lasted for at least 45 minutes prior to seeking medical attention, and were associated with diaphoresis and radiation to the right arm. He reports no baseline history of chest tightness, has had some recent chest congestion, placed on antibiotics just yesterday by his PCP for bronchitis.  He does not report any known history of CAD or previous myocardial infarction. Record review finds prior evaluation by the [facility]ology practice in [place], saw Dr. Molly Madurolevenger in July 2015 for shortness of breath and reportedly had a negative exercise echocardiogram at that time.  Serial ECGs reviewed showing sinus rhythm, poor R-wave progression, equivocal ST segment elevation in the inferior leads and some degree anteroseptal leads. No definite reciprocal changes however. Initial troponin I was negative with subsequent elevation to 0.19. ECG without acute findings.  He has a history of hypertension and hyperlipidemia on medications outlined below. Also prior history of tobacco use, uncertain if he has any documented chronic lung disease.  No Known Allergies  Home Medications Lisinopril 40 mg by mouth daily Meloxicam 7.5 mg by mouth twice a day Lipitor 40 mg by mouth daily  Norvasc 10 mg by mouth daily c Cetirizine 10 mg by mouth daily  Allopurinol 100 mg daily b Benzonatate 100 mg by mouth daily at bedtime t Tramadol 50 mg by mouth every 6 hours  Recent course of a Azithromycin 250 mg daily  Symbicort 80/4.5 as directed   Past Medical History  Diagnosis Date  . Essential hypertension   . Pneumothorax    Associated with pneumonia  . Gout     Past Surgical History  Procedure Laterality Date  . Arm surgery    . Inguinal hernia repair Right 09/09/2012    Procedure: HERNIA REPAIR INGUINAL INCARCERATED;  Surgeon: Kandis Cockingavid H Newman, MD;  Location: [facility];  Service: General;  Laterality: Right;  . Insertion of mesh Right 09/09/2012    Procedure: INSERTION OF MESH;  Surgeon: Kandis Cockingavid H Newman, MD;  Location: [facility];  Service: General;  Laterality: Right;  . Cataract extraction w/phaco Right 01/14/2013    Procedure: RIGHT EYE CATARACT EXTRACTION PHACO AND INTRAOCULAR LENS PLACEMENT ;  Surgeon: Gemma PayorKerry Hunt, MD;  Location: [facility];  Service: Ophthalmology;  Laterality: Right;  CDE 54.97  . Cataract extraction w/phaco Left 02/18/2013    Procedure: CATARACT EXTRACTION PHACO AND INTRAOCULAR LENS PLACEMENT (IOC);  Surgeon: Gemma PayorKerry Hunt, MD;  Location: [facility];  Service: Ophthalmology;  Laterality: Left;  CDE 34.02    Family History  Problem Relation Age of Onset  . Stroke Brother   . COPD Father     Social History [name]. His smoking use included Cigarettes. He does not have any smokeless tobacco history on file. [name] reports that he does not drink alcohol.  Review of Systems Complete review of systems negative except as otherwise outlined in the clinical summary and also the following. Recent chest congestion and mild cough, no fevers or chills.  Physical Examination Blood pressure 129/77, pulse 69, temperature 97.7 F (36.5 C), temperature source Oral, resp. rate 19, height 5' 4.17" (1.63 m), weight 229 lb 15 oz (104.3 kg), SpO2 90 %.  No intake or output data in the 24 hours ending 12/04/14 1558  Telemetry: Sinus rhythm.  Gen.: Overweight male, no distress. HEENT: Conjunctiva and lids normal, oropharynx clear. Neck: Supple, no elevated JVP or carotid bruits, no thyromegaly. Bearded. Lungs: Clear to auscultation, nonlabored breathing at rest. Cardiac: Regular  rate and rhythm, no S3 or significant systolic murmur, no pericardial rub. Abdomen: Soft, nontender, bowel sounds present, no guarding or rebound. Extremities: Trace ankle edema, distal pulses 2+. Skin: Warm and dry. Musculoskeletal: No kyphosis. Neuropsychiatric: Alert and oriented x3, affect grossly appropriate.   Lab Results  Basic Metabolic Panel:  Recent Labs Lab 12/04/14 1038  NA 137  K 4.1  CL 103  CO2 25  GLUCOSE 144*  BUN 17  CREATININE 1.13  CALCIUM 9.3    CBC:  Recent Labs Lab 12/04/14 1038  WBC 12.9*  NEUTROABS 6.3  HGB 15.6  HCT 46.6  MCV 89.4  PLT 295    Cardiac Enzymes:  Recent Labs Lab 12/04/14 1038 12/04/14 1420  TROPONINI <0.03 0.19*    ECG Serial tracings reviewed above.  Imaging Chest x-ray 12/04/2014: FINDINGS: The heart size and mediastinal contours are within normal limits. Both lungs are clear. The visualized skeletal structures are unremarkable.  IMPRESSION: No active disease.  Impression  1. NSTEMI, onset of chest tightness and diaphoresis earlier this morning at rest. No prior recurring chest pain but history of recent test chest congestion. Troponin I level up to 0.19 after initial normal, ECG with nonspecific ST segment abnormalities as outlined above. Baseline cardiac risk factors include age and gender, hypertension, and hyperlipidemia.  2. Essential hypertension, on lisinopril and Norvasc at home.  3. Hypokalemia, on Lipitor at home.  4. Previous history of tobacco use, uncertain if he has any documented history of chronic lung disease. He does report a previous pneumothorax with episode of pneumonia years ago.  Recommendations  Discussed situation with patient, wife, and daughter present. Plan is to start him on heparin and nitroglycerin infusions, initiate aspirin and low-dose beta blocker, and continue baseline medical regimen. He will be transferred to our cardiology service at [facility], stepdown unit, in  anticipation of a diagnostic cardiac catheterization tomorrow presuming he continues to further stabilize in the interim. Risks and benefits discussed, and he is in agreement to proceed at this point. Follow-up ECG and lipid panel in the morning.  Jonelle Sidle, M.D., F.A.C.C.

## 2014-12-04 NOTE — Progress Notes (Signed)
ANTICOAGULATION CONSULT NOTE - Initial Consult  Pharmacy Consult for heparin Indication: chest pain/ACS  No Known Allergies  Patient Measurements:   Heparin Dosing Weight: 83.4 kg  Vital Signs: Temp: 97.7 F (36.5 C) (11/10 1122) Temp Source: Oral (11/10 1122) BP: 129/77 mmHg (11/10 1400) Pulse Rate: 69 (11/10 1400)  Labs:  Recent Labs  12/04/14 1038 12/04/14 1420  HGB 15.6  --   HCT 46.6  --   PLT 295  --   CREATININE 1.13  --   TROPONINI <0.03 0.19*    CrCl cannot be calculated (Unknown ideal weight.).   Medical History: Past Medical History  Diagnosis Date  . Essential hypertension   . Lung collapse   . Gout     Medications:  See medication history  Assessment: 69 yo man to start heparin for CP, r/o ACS.  He was not on anticoagulants PTA. Goal of Therapy:  Heparin level 0.3-0.7 units/ml Monitor platelets by anticoagulation protocol: Yes   Plan:  Heparin bolus 4000 units and drip at 1300 units/hr Check HL in 6 hours Daily HL and CBC while on heparin Monitor for bleeding complications  Thanks for allowing pharmacy to be a part of this patient's care.  Talbert CageLora Oronde Hallenbeck, PharmD Clinical Pharmacist  12/04/2014,3:30 PM

## 2014-12-04 NOTE — ED Notes (Signed)
Pt O2 sat variable, placed on [place] at 2L.

## 2014-12-04 NOTE — ED Notes (Signed)
Pt complain of pain in right chest area radiating up to shoulder. Denies other symptoms

## 2014-12-04 NOTE — ED Notes (Signed)
Duplicate entry on morphine order.

## 2014-12-04 NOTE — ED Notes (Signed)
Morphine given by [facility].

## 2014-12-04 NOTE — ED Provider Notes (Signed)
CSN: [id number]     Arrival date & time 12/04/14  1013 History  By signing my name below, I, Murriel Hopper, attest that this documentation has been prepared under the direction and in the presence of [name], MD. Electronically Signed: Murriel Hopper, ED Scribe. 12/04/2014. 10:57 AM.    Chief Complaint  Patient presents with  . Chest Pain     The history is provided by the patient and a relative. No language interpreter was used.   HPI Comments: Gregory [name] is a 69 y.o. male with a hx of HTN who presents to the Emergency Department complaining of constant, sharp, central to right-sided chest pain that radiates to his right shoulder and right arm with associated SOB, diaphoresis that has been present for two hours. Pt denies doing anything to treat his pain PTA. His wife also notes his face looked pale this morning. His daughter states that pt and his wife do not do any physical activity. Pt denies nausea, vomiting. Pt is not a smoker.    Past Medical History  Diagnosis Date  . Essential hypertension   . Pneumothorax     Associated with pneumonia  . Gout    Past Surgical History  Procedure Laterality Date  . Arm surgery    . Inguinal hernia repair Right 09/09/2012    Procedure: HERNIA REPAIR INGUINAL INCARCERATED;  Surgeon: Kandis Cocking, MD;  Location: [facility];  Service: General;  Laterality: Right;  . Insertion of mesh Right 09/09/2012    Procedure: INSERTION OF MESH;  Surgeon: Kandis Cocking, MD;  Location: [facility];  Service: General;  Laterality: Right;  . Cataract extraction w/phaco Right 01/14/2013    Procedure: RIGHT EYE CATARACT EXTRACTION PHACO AND INTRAOCULAR LENS PLACEMENT ;  Surgeon: Gemma Payor, MD;  Location: [facility];  Service: Ophthalmology;  Laterality: Right;  CDE 54.97  . Cataract extraction w/phaco Left 02/18/2013    Procedure: CATARACT EXTRACTION PHACO AND INTRAOCULAR LENS PLACEMENT (IOC);  Surgeon: Gemma Payor, MD;  Location: [facility];  Service: Ophthalmology;   Laterality: Left;  CDE 34.02   Family History  Problem Relation Age of Onset  . Stroke Brother   . COPD Father    Social History  Substance Use Topics  . Smoking status: Former Smoker    Types: Cigarettes  . Smokeless tobacco: None  . Alcohol Use: No    Review of Systems  A complete 10 system review of systems was obtained and all systems are negative except as noted in the HPI and PMH.    Allergies  Review of patient's allergies indicates no known allergies.  Home Medications   Prior to Admission medications   Medication Sig Start Date End Date Taking? Authorizing Provider  acetaminophen (TYLENOL) 500 MG tablet Take 1,000 mg by mouth every 6 (six) hours as needed for mild pain.   Yes Historical Provider, MD  allopurinol (ZYLOPRIM) 300 MG tablet Take 300 mg by mouth daily. 09/25/13  Yes Historical Provider, MD  amLODipine (NORVASC) 10 MG tablet Take 10 mg by mouth daily.   Yes Historical Provider, MD  atorvastatin (LIPITOR) 40 MG tablet Take 40 mg by mouth daily.   Yes Historical Provider, MD  azithromycin (ZITHROMAX) 250 MG tablet Take 250 mg by mouth daily.   Yes Historical Provider, MD  benzonatate (TESSALON) 100 MG capsule Take 100 mg by mouth at bedtime.   Yes Historical Provider, MD  budesonide-formoterol (SYMBICORT) 160-4.5 MCG/ACT inhaler Inhale 2 puffs into the lungs 2 (two) times  daily.   Yes Historical Provider, MD  cetirizine (ZYRTEC) 10 MG tablet Take 10 mg by mouth daily.   Yes Historical Provider, MD  fluticasone (FLONASE) 50 MCG/ACT nasal spray Place 1 spray into both nostrils daily as needed for allergies.  11/18/14  Yes Historical Provider, MD  lisinopril (PRINIVIL,ZESTRIL) 40 MG tablet Take 40 mg by mouth daily.   Yes Historical Provider, MD  meloxicam (MOBIC) 7.5 MG tablet Take 7.5 mg by mouth 2 (two) times daily.   Yes Historical Provider, MD  simvastatin (ZOCOR) 20 MG tablet Take 20 mg by mouth daily.   Yes Historical Provider, MD  traMADol (ULTRAM) 50 MG  tablet Take 50 mg by mouth every 6 (six) hours as needed for moderate pain.   Yes Historical Provider, MD   BP 129/77 mmHg  Pulse 69  Temp(Src) 97.7 F (36.5 C) (Oral)  Resp 19  Ht 5' 4.17" (1.63 m)  Wt 229 lb 15 oz (104.3 kg)  BMI 39.26 kg/m2  SpO2 90% Physical Exam  Constitutional: He is oriented to person, place, and time. He appears well-developed and well-nourished.  HENT:  Head: Normocephalic and atraumatic.  Eyes: Conjunctivae and EOM are normal. Pupils are equal, round, and reactive to light.  Neck: Normal range of motion. Neck supple.  Cardiovascular: Normal rate and regular rhythm.   Pulmonary/Chest: Effort normal and breath sounds normal. He exhibits tenderness.  Tender to right chest wall  Abdominal: Soft. Bowel sounds are normal.  Musculoskeletal: Normal range of motion.  Neurological: He is alert and oriented to person, place, and time.  Skin: Skin is warm and dry.  Psychiatric: He has a normal mood and affect. His behavior is normal.  Nursing note and vitals reviewed.   ED Course  Procedures (including critical care time)  DIAGNOSTIC STUDIES: Oxygen Saturation is 94% on room air, normal by my interpretation.    COORDINATION OF CARE: 10:52 AM Discussed treatment plan with pt at bedside and pt agreed to plan. Pt will receive cardiovascular blood work, chest x-ray, and EKG.    Labs Review Labs Reviewed  BASIC METABOLIC PANEL - Abnormal; Notable for the following:    Glucose, Bld 144 (*)    All other components within normal limits  CBC WITH DIFFERENTIAL/PLATELET - Abnormal; Notable for the following:    WBC 12.9 (*)    Lymphs Abs 5.0 (*)    Eosinophils Absolute 0.8 (*)    All other components within normal limits  TROPONIN I - Abnormal; Notable for the following:    Troponin I 0.19 (*)    All other components within normal limits  TROPONIN I  HEPARIN LEVEL (UNFRACTIONATED)    Imaging Review Dg Chest Portable 1 View  12/04/2014  CLINICAL DATA:   Right-sided chest pain radiating into the right arm for 2 hours EXAM: PORTABLE CHEST - 1 VIEW COMPARISON:  10/03/2014 FINDINGS: The heart size and mediastinal contours are within normal limits. Both lungs are clear. The visualized skeletal structures are unremarkable. IMPRESSION: No active disease. Electronically Signed   By: Alcide CleverMark  Lukens M.D.   On: 12/04/2014 11:01   I have personally reviewed and evaluated these images and lab results as part of my medical decision-making.   EKG Interpretation   Date/Time:  Thursday December 04 2014 10:24:41 EST Ventricular Rate:  75 PR Interval:  178 QRS Duration: 88 QT Interval:  409 QTC Calculation: 457 R Axis:   -21 Text Interpretation:  Sinus rhythm Borderline left axis deviation Low  voltage, precordial leads  ST elevation, consider inferior injury Confirmed  by Edgardo Petrenko  MD, Maury Bamba ([id number]) on 12/04/2014 11:02:03 AM Also confirmed by  Adriana Simas  MD, Corvin Sorbo ([id number])  on 12/04/2014 3:37:52 PM     CRITICAL CARE Performed by: [name] Total critical care time: 40 minutes Critical care time was exclusive of separately billable procedures and treating other patients. Critical care was necessary to treat or prevent imminent or life-threatening deterioration. Critical care was time spent personally by me on the following activities: development of treatment plan with patient and/or surrogate as well as nursing, discussions with consultants, evaluation of patient's response to treatment, examination of patient, obtaining history from patient or surrogate, ordering and performing treatments and interventions, ordering and review of laboratory studies, ordering and review of radiographic studies, pulse oximetry and re-evaluation of patient's condition. MDM   Final diagnoses:  Elevated troponin    Patient with moderate cardiac risk factors including hypertension and hypercholesterolemia presents with central chest pain and radiation to right arm. Initial EKG normal.  First troponin negative. Second troponin elevated. Patient has remained hemodynamically stable. Discussed with cardiologist. Will transfer to [facility]. Rx aspirin, IV nitroglycerin, IV heparin.   I personally performed the services described in this documentation, which was scribed in my presence. The recorded information has been reviewed and is accurate.     [name], MD 12/04/14 [phone]

## 2014-12-05 ENCOUNTER — Encounter (HOSPITAL_COMMUNITY)
Admission: EM | Disposition: A | Discharge status: Home / Self Care | Payer: Self-pay | Source: Home / Self Care | Attending: Cardiovascular Disease

## 2014-12-05 ENCOUNTER — Encounter (HOSPITAL_COMMUNITY): Payer: Self-pay | Admitting: Interventional Cardiology

## 2014-12-05 ENCOUNTER — Inpatient Hospital Stay (HOSPITAL_COMMUNITY): Payer: Medicare Other

## 2014-12-05 DIAGNOSIS — E785 Hyperlipidemia, unspecified: Secondary | ICD-10-CM

## 2014-12-05 DIAGNOSIS — R079 Chest pain, unspecified: Secondary | ICD-10-CM

## 2014-12-05 DIAGNOSIS — I2510 Atherosclerotic heart disease of native coronary artery without angina pectoris: Secondary | ICD-10-CM

## 2014-12-05 HISTORY — PX: CARDIAC CATHETERIZATION: SHX172

## 2014-12-05 LAB — BASIC METABOLIC PANEL
Anion gap: 11 (ref 5–15)
BUN: 15 mg/dL (ref 6–20)
CALCIUM: 9.4 mg/dL (ref 8.9–10.3)
CO2: 28 mmol/L (ref 22–32)
CREATININE: 1.38 mg/dL — AB (ref 0.61–1.24)
Chloride: 101 mmol/L (ref 101–111)
GFR calc non Af Amer: 51 mL/min — ABNORMAL LOW (ref >60)
GFR, EST AFRICAN AMERICAN: 60 mL/min — AB (ref >60)
Glucose, Bld: 116 mg/dL — ABNORMAL HIGH (ref 65–99)
Potassium: 5.2 mmol/L — ABNORMAL HIGH (ref 3.5–5.1)
Sodium: 140 mmol/L (ref 135–145)

## 2014-12-05 LAB — CBC
HEMATOCRIT: 45.6 % (ref 39.0–52.0)
Hemoglobin: 15 g/dL (ref 13.0–17.0)
MCH: 29.5 pg (ref 26.0–34.0)
MCHC: 32.9 g/dL (ref 30.0–36.0)
MCV: 89.8 fL (ref 78.0–100.0)
Platelets: 279 10*3/uL (ref 150–400)
RBC: 5.08 MIL/uL (ref 4.22–5.81)
RDW: 14.7 % (ref 11.5–15.5)
WBC: 13.7 10*3/uL — ABNORMAL HIGH (ref 4.0–10.5)

## 2014-12-05 LAB — LIPID PANEL
Cholesterol: 117 mg/dL (ref 0–200)
HDL: 30 mg/dL — ABNORMAL LOW (ref >40)
LDL CALC: 66 mg/dL (ref 0–99)
TRIGLYCERIDES: 105 mg/dL (ref <150)
Total CHOL/HDL Ratio: 3.9 RATIO
VLDL: 21 mg/dL (ref 0–40)

## 2014-12-05 LAB — PROTIME-INR
INR: 1.11 (ref 0.00–1.49)
PROTHROMBIN TIME: 14.5 s (ref 11.6–15.2)

## 2014-12-05 LAB — POCT ACTIVATED CLOTTING TIME: ACTIVATED CLOTTING TIME: 276 s

## 2014-12-05 LAB — HEPARIN LEVEL (UNFRACTIONATED): HEPARIN UNFRACTIONATED: 0.28 [IU]/mL — AB (ref 0.30–0.70)

## 2014-12-05 SURGERY — LEFT HEART CATH AND CORONARY ANGIOGRAPHY
Anesthesia: LOCAL

## 2014-12-05 MED ORDER — IOHEXOL 350 MG/ML SOLN
INTRAVENOUS | Status: DC | PRN
  Administered 2014-12-05: 160 mL via INTRACARDIAC

## 2014-12-05 MED ORDER — LIDOCAINE HCL (PF) 1 % IJ SOLN
INTRAMUSCULAR | Status: DC | PRN
  Administered 2014-12-05: 12:00:00

## 2014-12-05 MED ORDER — HEPARIN SODIUM (PORCINE) 1000 UNIT/ML IJ SOLN
INTRAMUSCULAR | Status: DC | PRN
  Administered 2014-12-05: 5000 [IU] via INTRAVENOUS
  Administered 2014-12-05: 4000 [IU] via INTRAVENOUS

## 2014-12-05 MED ORDER — HEPARIN (PORCINE) IN NACL 2-0.9 UNIT/ML-% IJ SOLN
INTRAMUSCULAR | Status: AC
  Filled 2014-12-05: qty 500

## 2014-12-05 MED ORDER — ASPIRIN 81 MG PO CHEW
81.0000 mg | CHEWABLE_TABLET | Freq: Every day | ORAL | Status: DC
  Administered 2014-12-06: 81 mg via ORAL
  Filled 2014-12-05: qty 1

## 2014-12-05 MED ORDER — SODIUM CHLORIDE 0.9 % IV SOLN
INTRAVENOUS | Status: DC
  Administered 2014-12-05: 08:00:00 via INTRAVENOUS

## 2014-12-05 MED ORDER — MIDAZOLAM HCL 2 MG/2ML IJ SOLN
INTRAMUSCULAR | Status: AC
  Filled 2014-12-05: qty 4

## 2014-12-05 MED ORDER — ADENOSINE 12 MG/4ML IV SOLN
16.0000 mL | Freq: Once | INTRAVENOUS | Status: AC
  Administered 2014-12-05: 48 mg via INTRAVENOUS
  Filled 2014-12-05: qty 16

## 2014-12-05 MED ORDER — SODIUM CHLORIDE 0.9 % IJ SOLN: 3.0000 mL | INTRAMUSCULAR | Status: DC | PRN

## 2014-12-05 MED ORDER — ADENOSINE (DIAGNOSTIC) 140MCG/KG/MIN
INTRAVENOUS | Status: DC | PRN
  Administered 2014-12-05: 140 ug/kg/min via INTRAVENOUS

## 2014-12-05 MED ORDER — ATORVASTATIN CALCIUM 80 MG PO TABS
80.0000 mg | ORAL_TABLET | Freq: Every day | ORAL | Status: DC
  Administered 2014-12-05: 80 mg via ORAL
  Filled 2014-12-05: qty 1

## 2014-12-05 MED ORDER — HEPARIN (PORCINE) IN NACL 100-0.45 UNIT/ML-% IJ SOLN
1550.0000 [IU]/h | INTRAMUSCULAR | Status: DC
  Administered 2014-12-05: 21:00:00 1400 [IU]/h via INTRAVENOUS
  Filled 2014-12-05: qty 250

## 2014-12-05 MED ORDER — CLOPIDOGREL BISULFATE 75 MG PO TABS
75.0000 mg | ORAL_TABLET | Freq: Every day | ORAL | Status: DC
  Administered 2014-12-06: 10:00:00 75 mg via ORAL
  Filled 2014-12-05: qty 1

## 2014-12-05 MED ORDER — HEPARIN (PORCINE) IN NACL 2-0.9 UNIT/ML-% IJ SOLN
INTRAMUSCULAR | Status: DC | PRN
  Administered 2014-12-05: 11:00:00 via INTRA_ARTERIAL

## 2014-12-05 MED ORDER — VERAPAMIL HCL 2.5 MG/ML IV SOLN
INTRAVENOUS | Status: AC
  Filled 2014-12-05: qty 2

## 2014-12-05 MED ORDER — PNEUMOCOCCAL VAC POLYVALENT 25 MCG/0.5ML IJ INJ
0.5000 mL | INJECTION | INTRAMUSCULAR | Status: AC
  Administered 2014-12-06: 0.5 mL via INTRAMUSCULAR
  Filled 2014-12-05: qty 0.5

## 2014-12-05 MED ORDER — ACETAMINOPHEN 325 MG PO TABS: 650.0000 mg | ORAL_TABLET | ORAL | Status: DC | PRN

## 2014-12-05 MED ORDER — ONDANSETRON HCL 4 MG/2ML IJ SOLN: 4.0000 mg | Freq: Four times a day (QID) | INTRAMUSCULAR | Status: DC | PRN

## 2014-12-05 MED ORDER — HEPARIN (PORCINE) IN NACL 100-0.45 UNIT/ML-% IJ SOLN: 1400.0000 [IU]/h | INTRAMUSCULAR | Status: DC

## 2014-12-05 MED ORDER — OXYCODONE-ACETAMINOPHEN 5-325 MG PO TABS: 1.0000 | ORAL_TABLET | ORAL | Status: DC | PRN

## 2014-12-05 MED ORDER — SODIUM CHLORIDE 0.9 % WEIGHT BASED INFUSION
3.0000 mL/kg/h | INTRAVENOUS | Status: AC
  Administered 2014-12-05: 12:00:00 3 mL/kg/h via INTRAVENOUS

## 2014-12-05 MED ORDER — LIDOCAINE HCL (PF) 1 % IJ SOLN
INTRAMUSCULAR | Status: AC
  Filled 2014-12-05: qty 30

## 2014-12-05 MED ORDER — TICAGRELOR 90 MG PO TABS
ORAL_TABLET | ORAL | Status: DC | PRN
  Administered 2014-12-05: 180 mg via ORAL

## 2014-12-05 MED ORDER — FENTANYL CITRATE (PF) 100 MCG/2ML IJ SOLN
INTRAMUSCULAR | Status: DC | PRN
  Administered 2014-12-05: 50 ug via INTRAVENOUS
  Administered 2014-12-05: 25 ug via INTRAVENOUS

## 2014-12-05 MED ORDER — MIDAZOLAM HCL 2 MG/2ML IJ SOLN
INTRAMUSCULAR | Status: DC | PRN
  Administered 2014-12-05 (×2): 1 mg via INTRAVENOUS

## 2014-12-05 MED ORDER — SODIUM CHLORIDE 0.9 % IV SOLN: 250.0000 mL | INTRAVENOUS | Status: DC | PRN

## 2014-12-05 MED ORDER — FENTANYL CITRATE (PF) 100 MCG/2ML IJ SOLN
INTRAMUSCULAR | Status: AC
  Filled 2014-12-05: qty 4

## 2014-12-05 MED ORDER — SODIUM CHLORIDE 0.9 % IJ SOLN: 3.0000 mL | Freq: Two times a day (BID) | INTRAMUSCULAR | Status: DC

## 2014-12-05 MED ORDER — HEPARIN SODIUM (PORCINE) 1000 UNIT/ML IJ SOLN
INTRAMUSCULAR | Status: AC
  Filled 2014-12-05: qty 1

## 2014-12-05 SURGICAL SUPPLY — 11 items
CATH INFINITI 5 FR JL3.5 (CATHETERS) ×2 IMPLANT
CATH INFINITI JR4 5F (CATHETERS) ×2 IMPLANT
CATH VISTA GUIDE 6FR JR4 (CATHETERS) ×2 IMPLANT
DEVICE RAD COMP TR BAND LRG (VASCULAR PRODUCTS) ×2 IMPLANT
GLIDESHEATH SLEND A-KIT 6F 22G (SHEATH) ×2 IMPLANT
KIT HEART LEFT (KITS) ×2 IMPLANT
PACK CARDIAC CATHETERIZATION (CUSTOM PROCEDURE TRAY) ×2 IMPLANT
TRANSDUCER W/STOPCOCK (MISCELLANEOUS) ×2 IMPLANT
TUBING CIL FLEX 10 FLL-RA (TUBING) ×2 IMPLANT
WIRE PRESSURE VERRATA (WIRE) ×2 IMPLANT
WIRE SAFE-T 1.5MM-J .035X260CM (WIRE) ×2 IMPLANT

## 2014-12-05 NOTE — Progress Notes (Signed)
ANTICOAGULATION CONSULT NOTE - Follow Up Consult  Pharmacy Consult for Heparin Indication: chest pain/ACS  No Known Allergies  Patient Measurements: Height: 5' 4.17" (163 cm) (163 cm) Weight: 229 lb 15 oz (104.3 kg) (previous weight) IBW/kg (Calculated) : 59.6 Heparin Dosing Weight: 83.4kg  Vital Signs: Temp: 98.5 F (36.9 C) (11/11 0426) Temp Source: Oral (11/11 0426) BP: 113/71 mmHg (11/11 0615) Pulse Rate: 56 (11/11 0615)  Labs:  Recent Labs  12/04/14 1038 12/04/14 1420 12/04/14 2139 12/05/14 0553 12/05/14 0709  HGB 15.6  --   --   --  15.0  HCT 46.6  --   --   --  45.6  PLT 295  --   --   --  279  LABPROT  --   --   --  14.5  --   INR  --   --   --  1.11  --   HEPARINUNFRC  --   --   --  0.28*  --   CREATININE 1.13  --   --   --   --   TROPONINI <0.03 0.19* 2.88*  --   --     Estimated Creatinine Clearance: 69.5 mL/min (by C-G formula based on Cr of 1.13).   Medications:  Heparin @ 1300 units/hr  Assessment: 69yom started on heparin yesterday for NSTEMI. Initial heparin level today is slightly below goal at 0.28. CBC stable. No bleeding reported. Plan for cath today.  Goal of Therapy:  Heparin level 0.3-0.7 units/ml Monitor platelets by anticoagulation protocol: Yes   Plan:  1) Increase heparin to 1400 units/hr 2) Follow up after cath  Fredrik RiggerMarkle, Yousaf Sainato Sue 12/05/2014,7:45 AM

## 2014-12-05 NOTE — Progress Notes (Signed)
TR BAND REMOVAL  LOCATION right radial  DEFLATED PER PROTOCOL:    Yes.    TIME BAND OFF / DRESSING APPLIED:    1400   SITE UPON ARRIVAL:    Level 0  SITE AFTER BAND REMOVAL:    Level 0  CIRCULATION SENSATION AND MOVEMENT:    Within Normal Limits   Yes.    COMMENTS:   Checked at 1430 and frequently after with no change in assessment noted. Dressing dry and intact, CSMs wnls without change and palpable pulses present without change.

## 2014-12-05 NOTE — Progress Notes (Signed)
  Echocardiogram 2D Echocardiogram has been performed.  [name], Gregory [name] 12/05/2014, 9:54 AM

## 2014-12-05 NOTE — Progress Notes (Signed)
   12/05/14 1525  Clinical Encounter Type  Visited With Patient and family together;Health care provider  Visit Type Initial  Referral From Patient   Chaplain met with patient to see about completing an advanced directive. Patient indicated that they've elected to wait a bit before completing the document. Chaplain support available as needed.   Jeri Lager, Chaplain 12/05/2014 3:25 PM

## 2014-12-05 NOTE — Progress Notes (Signed)
Dr. Haskell RilingFreedman made aware of pt Trop of 2.88

## 2014-12-05 NOTE — Progress Notes (Signed)
ANTICOAGULATION CONSULT NOTE - Follow Up Consult  Pharmacy Consult for Heparin > resume 8 hrs after sheath pull Indication: chest pain/ACS  No Known Allergies  Patient Measurements: Height: 5' 4.17" (163 cm) (163 cm) Weight: 229 lb 15 oz (104.3 kg) (previous weight) IBW/kg (Calculated) : 59.6 Heparin Dosing Weight: 83.4kg  Vital Signs: Temp: 98.4 F (36.9 C) (11/11 1200) Temp Source: Oral (11/11 1200) BP: 105/54 mmHg (11/11 1200) Pulse Rate: 56 (11/11 1200)  Labs:  Recent Labs  12/04/14 1038 12/04/14 1420 12/04/14 2139 12/05/14 0553 12/05/14 0709  HGB 15.6  --   --   --  15.0  HCT 46.6  --   --   --  45.6  PLT 295  --   --   --  279  LABPROT  --   --   --  14.5  --   INR  --   --   --  1.11  --   HEPARINUNFRC  --   --   --  0.28*  --   CREATININE 1.13  --   --   --  1.38*  TROPONINI <0.03 0.19* 2.88*  --   --     Estimated Creatinine Clearance: 56.9 mL/min (by C-G formula based on Cr of 1.38).   Medications:  Heparin held for cath  Assessment: 69yom started on heparin yesterday for NSTEMI. Initial heparin level today is slightly below goal at 0.28. CBC stable. No bleeding reported. Now s/p cath and pharmacy asked to resume IV heparin.  Goal of Therapy:  Heparin level 0.3-0.7 units/ml Monitor platelets by anticoagulation protocol: Yes   Plan:  1) Resume heparin at 1400 units/hr 8 hrs after sheath removed (removed at 1134 AM - so resume at 1930 PM). 2) Check heparin level 6 hrs after heparin starts. 3) Daily heparin level and CBC. 4) F/u plans for further cardiac intervention.  Tad MooreJessica Isolde Skaff, Pharm D, BCPS  Clinical Pharmacist Pager [phone]  12/05/2014 1:57 PM

## 2014-12-05 NOTE — Progress Notes (Signed)
    Subjective:  Denies CP or dyspnea   Objective:  Filed Vitals:   12/05/14 0400 12/05/14 0426 12/05/14 0500 12/05/14 0615  BP: 99/54  103/60 113/71  Pulse: 49  50 56  Temp:  98.5 F (36.9 C)    TempSrc:  Oral    Resp: 15  14 17   Height:      Weight:      SpO2: 93%  92% 87%    Intake/Output from previous day:  Intake/Output Summary (Last 24 hours) at 12/05/14 0800 Last data filed at 12/05/14 0600  Gross per 24 hour  Intake 215.46 ml  Output      0 ml  Net 215.46 ml    Physical Exam: Physical exam: Well-developed well-nourished in no acute distress.  Skin is warm and dry.  HEENT is normal.  Neck is supple.   Chest is clear to auscultation with normal expansion.  Cardiovascular exam is regular rate and rhythm.  Abdominal exam nontender or distended. No masses palpated. Extremities show no edema. neuro grossly intact    Lab Results: Basic Metabolic Panel:  Recent Labs  40/98/1109/11/08 1038 12/05/14 0709  NA 137 140  K 4.1 5.2*  CL 103 101  CO2 25 28  GLUCOSE 144* 116*  BUN 17 15  CREATININE 1.13 1.38*  CALCIUM 9.3 9.4   CBC:  Recent Labs  12/04/14 1038 12/05/14 0709  WBC 12.9* 13.7*  NEUTROABS 6.3  --   HGB 15.6 15.0  HCT 46.6 45.6  MCV 89.4 89.8  PLT 295 279   Cardiac Enzymes:  Recent Labs  12/04/14 1038 12/04/14 1420 12/04/14 2139  TROPONINI <0.03 0.19* 2.88*     Assessment/Plan:  1 non-ST elevation myocardial infarction-the patient has ruled in. He is pain-free. Continue aspirin, heparin, statin, metoprolol and nitroglycerin. Proceed with cardiac catheterization today. The risks and benefits were discussed and the patient agrees to proceed. Schedule echocardiogram to assess LV function. 2 acute kidney disease-creatinine has increased to 1.38 today. Hydrate prior to catheterization. Discontinue Mobic. Hold lisinopril. No ventriculogram. Follow renal function after procedure. 3 hyperlipidemia-increase Lipitor to 80 mg daily. 4  hypertension-continue present medications but hold ACE inhibitor prior to catheterization given increased creatinine.  Olga MillersBrian Crenshaw 12/05/2014, 8:00 AM

## 2014-12-05 NOTE — Progress Notes (Signed)
Pt has been NPO since midnight on 11.11.16

## 2014-12-05 NOTE — Interval H&P Note (Signed)
Cath Lab Visit (complete for each Cath Lab visit)  Clinical Evaluation Leading to the Procedure:   ACS: Yes.    Non-ACS:    Anginal Classification: CCS III  Anti-ischemic medical therapy: Minimal Therapy (1 class of medications)  Non-Invasive Test Results: No non-invasive testing performed  Prior CABG: No previous CABG      History and Physical Interval Note:  12/05/2014 10:09 AM  Gregory [name]  has presented today for surgery, with the diagnosis of cp  The various methods of treatment have been discussed with the patient and family. After consideration of risks, benefits and other options for treatment, the patient has consented to  Procedure(s): Left Heart Cath and Coronary Angiography (N/A) as a surgical intervention .  The patient's history has been reviewed, patient examined, no change in status, stable for surgery.  I have reviewed the patient's chart and labs.  Questions were answered to the patient's satisfaction.     Lesleigh NoeSMITH III,Pavel Gadd W

## 2014-12-05 NOTE — H&P (View-Only) (Signed)
ANTICOAGULATION CONSULT NOTE - Follow Up Consult  Pharmacy Consult for Heparin Indication: chest pain/ACS  No Known Allergies  Patient Measurements: Height: 5' 4.17" (163 cm) (163 cm) Weight: 229 lb 15 oz (104.3 kg) (previous weight) IBW/kg (Calculated) : 59.6 Heparin Dosing Weight: 83.4kg  Vital Signs: Temp: 98.5 F (36.9 C) (11/11 0426) Temp Source: Oral (11/11 0426) BP: 113/71 mmHg (11/11 0615) Pulse Rate: 56 (11/11 0615)  Labs:  Recent Labs  12/04/14 1038 12/04/14 1420 12/04/14 2139 12/05/14 0553 12/05/14 0709  HGB 15.6  --   --   --  15.0  HCT 46.6  --   --   --  45.6  PLT 295  --   --   --  279  LABPROT  --   --   --  14.5  --   INR  --   --   --  1.11  --   HEPARINUNFRC  --   --   --  0.28*  --   CREATININE 1.13  --   --   --   --   TROPONINI <0.03 0.19* 2.88*  --   --     Estimated Creatinine Clearance: 69.5 mL/min (by C-G formula based on Cr of 1.13).   Medications:  Heparin @ 1300 units/hr  Assessment: 69yom started on heparin yesterday for NSTEMI. Initial heparin level today is slightly below goal at 0.28. CBC stable. No bleeding reported. Plan for cath today.  Goal of Therapy:  Heparin level 0.3-0.7 units/ml Monitor platelets by anticoagulation protocol: Yes   Plan:  1) Increase heparin to 1400 units/hr 2) Follow up after cath  Gregory [name] 12/05/2014,7:45 AM   

## 2014-12-05 NOTE — Progress Notes (Signed)
Not coming back to 2H. No belongings at bedside.

## 2014-12-05 NOTE — Care Management Note (Signed)
Case Management Note  Patient Details  Name: Arneta ClicheRichard W Minella MRN: [id number] Date of Birth: 07/05/1945  Subjective/Objective:        Adm w nstemi            Action/Plan: lives w wife, pcp dr s Jean Rosenthaljackson   Expected Discharge Date:                  Expected Discharge Plan:     In-House Referral:     Discharge planning Services     Post Acute Care Choice:    Choice offered to:     DME Arranged:    DME Agency:     HH Arranged:    HH Agency:     Status of Service:     Medicare Important Message Given:    Date Medicare IM Given:    Medicare IM give by:    Date Additional Medicare IM Given:    Additional Medicare Important Message give by:     If discussed at Long Length of Stay Meetings, dates discussed:    Additional Comments: ur review done  Hanley HaysDowell, Hatsuko Bizzarro T, RN 12/05/2014, 8:09 AM

## 2014-12-05 NOTE — Progress Notes (Signed)
Nutrition Brief Note  Patient identified on the Malnutrition Screening Tool (MST) Report  Wt Readings from Last 15 Encounters:  12/04/14 229 lb 15 oz (104.3 kg)  09/27/13 230 lb (104.327 kg)  02/18/13 232 lb (105.235 kg)  01/13/13 220 lb (99.791 kg)  01/08/13 221 lb (100.245 kg)  12/27/12 223 lb (101.152 kg)  09/28/12 214 lb 12.8 oz (97.433 kg)  09/08/12 225 lb (102.059 kg)  09/17/10 230 lb (104.327 kg)   [name] is a 69 y.o.male past medical history outlined below, presented to the ER earlier this morning after developing moderately intense chest tightness at rest. Symptoms lasted for at least 45 minutes prior to seeking medical attention, and were associated with diaphoresis and radiation to the right arm. He reports no baseline history of chest tightness, has had some recent chest congestion, placed on antibiotics just yesterday by his PCP for bronchitis.  Pt in cath lab at time of visit.   Spoke with RN, who reports that pt was requesting food this morning and was eager to eat despite NPO status. Per RN, he does not appear malnourished.   Reviewed wt hx; wt has been stable > 1 year. Wt ranges between 220-230# at baseline.   Anticipate good intake once diet order is resumed.   Body mass index is 39.26 kg/(m^2). Patient meets criteria for obesity, class II based on current BMI.   Current diet order is NPO, patient is consuming approximately n/a% of meals at this time. Labs and medications reviewed.   No nutrition interventions warranted at this time. If nutrition issues arise, please consult RD.   [name], RD, LDN, CDE Pager: [phone] After hours Pager: [phone]

## 2014-12-06 DIAGNOSIS — I214 Non-ST elevation (NSTEMI) myocardial infarction: Secondary | ICD-10-CM | POA: Diagnosis not present

## 2014-12-06 DIAGNOSIS — E785 Hyperlipidemia, unspecified: Secondary | ICD-10-CM | POA: Diagnosis present

## 2014-12-06 DIAGNOSIS — I2510 Atherosclerotic heart disease of native coronary artery without angina pectoris: Secondary | ICD-10-CM | POA: Diagnosis present

## 2014-12-06 DIAGNOSIS — I10 Essential (primary) hypertension: Secondary | ICD-10-CM | POA: Diagnosis present

## 2014-12-06 DIAGNOSIS — R7989 Other specified abnormal findings of blood chemistry: Secondary | ICD-10-CM

## 2014-12-06 LAB — BASIC METABOLIC PANEL
Anion gap: 8 (ref 5–15)
BUN: 12 mg/dL (ref 6–20)
CO2: 22 mmol/L (ref 22–32)
CREATININE: 1.14 mg/dL (ref 0.61–1.24)
Calcium: 9 mg/dL (ref 8.9–10.3)
Chloride: 107 mmol/L (ref 101–111)
GFR calc Af Amer: >60 mL/min (ref >60)
Glucose, Bld: 112 mg/dL — ABNORMAL HIGH (ref 65–99)
Potassium: 3.6 mmol/L (ref 3.5–5.1)
SODIUM: 137 mmol/L (ref 135–145)

## 2014-12-06 LAB — CBC
HCT: 44 % (ref 39.0–52.0)
Hemoglobin: 14.7 g/dL (ref 13.0–17.0)
MCH: 29.5 pg (ref 26.0–34.0)
MCHC: 33.4 g/dL (ref 30.0–36.0)
MCV: 88.2 fL (ref 78.0–100.0)
PLATELETS: 263 10*3/uL (ref 150–400)
RBC: 4.99 MIL/uL (ref 4.22–5.81)
RDW: 14.5 % (ref 11.5–15.5)
WBC: 12.6 10*3/uL — AB (ref 4.0–10.5)

## 2014-12-06 LAB — HEPARIN LEVEL (UNFRACTIONATED): Heparin Unfractionated: 0.27 IU/mL — ABNORMAL LOW (ref 0.30–0.70)

## 2014-12-06 LAB — TROPONIN I: Troponin I: 1.9 ng/mL (ref <0.031)

## 2014-12-06 MED ORDER — ASPIRIN 81 MG PO CHEW: 81.0000 mg | CHEWABLE_TABLET | Freq: Every day | ORAL | Status: AC

## 2014-12-06 MED ORDER — POTASSIUM CHLORIDE CRYS ER 20 MEQ PO TBCR
20.0000 meq | EXTENDED_RELEASE_TABLET | Freq: Once | ORAL | Status: AC
  Administered 2014-12-06: 20 meq via ORAL
  Filled 2014-12-06: qty 1

## 2014-12-06 MED ORDER — NITROGLYCERIN 0.4 MG SL SUBL: 0.4000 mg | SUBLINGUAL_TABLET | SUBLINGUAL | Status: DC | PRN

## 2014-12-06 MED ORDER — ISOSORBIDE MONONITRATE ER 30 MG PO TB24
30.0000 mg | ORAL_TABLET | Freq: Every day | ORAL | Status: DC
  Administered 2014-12-06: 30 mg via ORAL
  Filled 2014-12-06: qty 1

## 2014-12-06 MED ORDER — ISOSORBIDE MONONITRATE ER 30 MG PO TB24: 30.0000 mg | ORAL_TABLET | Freq: Every day | ORAL | Status: DC

## 2014-12-06 MED ORDER — METOPROLOL TARTRATE 25 MG PO TABS: 12.5000 mg | ORAL_TABLET | Freq: Two times a day (BID) | ORAL | Status: DC

## 2014-12-06 MED ORDER — CLOPIDOGREL BISULFATE 75 MG PO TABS: 75.0000 mg | ORAL_TABLET | Freq: Every day | ORAL | Status: DC

## 2014-12-06 NOTE — Progress Notes (Signed)
Subjective:  No chest pain overnight  Objective:  Vital Signs in the last 24 hours: Temp:  [97.7 F (36.5 C)-98.4 F (36.9 C)] 97.9 F (36.6 C) (11/12 0801) Pulse Rate:  [54-111] 85 (11/12 0801) Resp:  [12-35] 18 (11/12 0801) BP: (87-144)/(51-80) 144/80 mmHg (11/12 0801) SpO2:  [90 %-100 %] 95 % (11/12 0801) Weight:  [220 lb 0.3 oz (99.8 kg)] 220 lb 0.3 oz (99.8 kg) (11/12 0314)  Intake/Output from previous day:  Intake/Output Summary (Last 24 hours) at 12/06/14 0916 Last data filed at 12/06/14 0802  Gross per 24 hour  Intake 2190.6 ml  Output   2200 ml  Net   -9.4 ml    Physical Exam: General appearance: alert, cooperative, no distress and mildly obese Neck: no JVD Lungs: clear to auscultation bilaterally Heart: regular rate and rhythm Extremities: RUE without hematoma Skin: Skin color, texture, turgor normal. No rashes or lesions Neurologic: Grossly normal   Rate: 70  Rhythm: normal sinus rhythm  Lab Results:  Recent Labs  12/05/14 0709 12/06/14 0240  WBC 13.7* 12.6*  HGB 15.0 14.7  PLT 279 263    Recent Labs  12/05/14 0709 12/06/14 0240  NA 140 137  K 5.2* 3.6  CL 101 107  CO2 28 22  GLUCOSE 116* 112*  BUN 15 12  CREATININE 1.38* 1.14    Recent Labs  12/04/14 1420 12/04/14 2139  TROPONINI 0.19* 2.88*    Recent Labs  12/05/14 0553  INR 1.11    Scheduled Meds: . allopurinol  300 mg Oral Daily  . amLODipine  10 mg Oral Daily  . aspirin  81 mg Oral Daily  . atorvastatin  80 mg Oral q1800  . azithromycin  250 mg Oral Daily  . budesonide-formoterol  2 puff Inhalation BID  . clopidogrel  75 mg Oral Q breakfast  . isosorbide mononitrate  30 mg Oral Daily  . loratadine  10 mg Oral Daily  . metoprolol tartrate  12.5 mg Oral BID  . pneumococcal 23 valent vaccine  0.5 mL Intramuscular Tomorrow-1000  . potassium chloride  20 mEq Oral Once  . sodium chloride  3 mL Intravenous Q12H   Continuous Infusions: . heparin 1,550 Units/hr  (12/06/14 0410)   PRN Meds:.sodium chloride, acetaminophen, benzonatate, nitroGLYCERIN, ondansetron (ZOFRAN) IV, oxyCODONE-acetaminophen, sodium chloride   Imaging: Dg Chest Portable 1 View  12/04/2014  CLINICAL DATA:  Right-sided chest pain radiating into the right arm for 2 hours EXAM: PORTABLE CHEST - 1 VIEW COMPARISON:  10/03/2014 FINDINGS: The heart size and mediastinal contours are within normal limits. Both lungs are clear. The visualized skeletal structures are unremarkable. IMPRESSION: No active disease. Electronically Signed   By: Alcide CleverMark  Lukens M.D.   On: 12/04/2014 11:01    Assessment/Plan:  69 y.o.male [facility]04[facility]14 with [place] with elevated Troponin. [facility] 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 This represents the culprit vessel. There was moderate diffuse disease slightly proximal to the total occlusion. A branch of the first diagonal contains 90% stenosis. Too small to intervene upon. There was 50-80% distal RCA prior to the origin of the PDA. FFR negative. There was high-grade stenosis in the continuation of the right coronary before the origin of a small left ventricular branch. There was normal LVF. He was placed on Heparin overnight. Troponin peaked at 2.88. He is anxious for discharge the morning of 11/12/1/6.   Principal Problem:   NSTEMI (non-ST  elevated myocardial infarction) (HCC) Active Problems:   CAD, multiple vessel   Essential hypertension   Dyslipidemia   PLAN: Add Imdur, discharge pending f/u Troponin.   Corine Shelter PA-C 12/06/2014, 9:16 AM [phone]  Addendum:  Pt was on Lisinopril 40 mg daily prior to admission. His B/P was low on admission and we have added beta blocker and Nitrate. Will hold off on Lisinopril for now but this may need to be adjusted as an OP.

## 2014-12-06 NOTE — Progress Notes (Signed)
Subjective:  No chest pain overnight  Objective:  Vital Signs in the last 24 hours: Temp:  [97.7 F (36.5 C)-98.4 F (36.9 C)] 97.9 F (36.6 C) (11/12 0801) Pulse Rate:  [54-111] 85 (11/12 0801) Resp:  [12-35] 18 (11/12 0801) BP: (87-144)/(51-80) 144/80 mmHg (11/12 0801) SpO2:  [90 %-100 %] 95 % (11/12 0801) Weight:  [220 lb 0.3 oz (99.8 kg)] 220 lb 0.3 oz (99.8 kg) (11/12 0314)  Intake/Output from previous day:  Intake/Output Summary (Last 24 hours) at 12/06/14 0811 Last data filed at 12/06/14 0802  Gross per 24 hour  Intake 2329.6 ml  Output   2200 ml  Net  129.6 ml    Physical Exam: General appearance: alert, cooperative, no distress and mildly obese Neck: no JVD Lungs: clear to auscultation bilaterally Heart: regular rate and rhythm Extremities: RUE without hematoma Skin: Skin color, texture, turgor normal. No rashes or lesions Neurologic: Grossly normal   Rate: 70  Rhythm: normal sinus rhythm  Lab Results:  Recent Labs  12/05/14 0709 12/06/14 0240  WBC 13.7* 12.6*  HGB 15.0 14.7  PLT 279 263    Recent Labs  12/05/14 0709 12/06/14 0240  NA 140 137  K 5.2* 3.6  CL 101 107  CO2 28 22  GLUCOSE 116* 112*  BUN 15 12  CREATININE 1.38* 1.14    Recent Labs  12/04/14 1420 12/04/14 2139  TROPONINI 0.19* 2.88*    Recent Labs  12/05/14 0553  INR 1.11    Scheduled Meds: . allopurinol  300 mg Oral Daily  . amLODipine  10 mg Oral Daily  . aspirin  81 mg Oral Daily  . atorvastatin  80 mg Oral q1800  . azithromycin  250 mg Oral Daily  . budesonide-formoterol  2 puff Inhalation BID  . clopidogrel  75 mg Oral Q breakfast  . loratadine  10 mg Oral Daily  . metoprolol tartrate  12.5 mg Oral BID  . pneumococcal 23 valent vaccine  0.5 mL Intramuscular Tomorrow-1000  . sodium chloride  3 mL Intravenous Q12H   Continuous Infusions: . heparin 1,550 Units/hr (12/06/14 0410)   PRN Meds:.sodium chloride, acetaminophen, benzonatate, nitroGLYCERIN,  ondansetron (ZOFRAN) IV, oxyCODONE-acetaminophen, sodium chloride   Imaging: Dg Chest Portable 1 View  12/04/2014  CLINICAL DATA:  Right-sided chest pain radiating into the right arm for 2 hours EXAM: PORTABLE CHEST - 1 VIEW COMPARISON:  10/03/2014 FINDINGS: The heart size and mediastinal contours are within normal limits. Both lungs are clear. The visualized skeletal structures are unremarkable. IMPRESSION: No active disease. Electronically Signed   By: Alcide Clever M.D.   On: 12/04/2014 11:01    Assessment/Plan:  69 y.o.male seen by Dr Diona Browner at [facility] 12/04/14 with [place] with elevated Troponin. He was transferred to [facility] for cath. 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 This represents the culprit vessel. There was moderate diffuse disease slightly proximal to the total occlusion. A branch of the first diagonal contains 90% stenosis. Too small to intervene upon. There was 50-80% distal RCA prior to the origin of the PDA. FFR negative. There was high-grade stenosis in the continuation of the right coronary before the origin of a small left ventricular branch. There was normal LVF. He was placed on Heparin overnight. Troponin peaked at 2.88. He is anxious for discharge the morning of 11/12/1/6.   Principal Problem:   NSTEMI (non-ST elevated myocardial infarction) (HCC) Active Problems:   CAD, multiple vessel   Essential hypertension   Dyslipidemia  PLAN: Add Imdur, discharge pending f/u Troponin.   Awad Gladd [employer]-C 12/06/2014, 8:11 AM [phone]

## 2014-12-06 NOTE — Progress Notes (Signed)
ANTICOAGULATION CONSULT NOTE - Follow Up Consult  Pharmacy Consult for Heparin  Indication: chest pain/ACS  No Known Allergies  Patient Measurements: Height: 5' 4.17" (163 cm) (163 cm) Weight: 220 lb 0.3 oz (99.8 kg) IBW/kg (Calculated) : 59.6  Vital Signs: Temp: 97.9 F (36.6 C) (11/12 0314) Temp Source: Oral (11/12 0314) BP: 134/68 mmHg (11/12 0314) Pulse Rate: 62 (11/12 0314)  Labs:  Recent Labs  12/04/14 1038 12/04/14 1420 12/04/14 2139 12/05/14 0553 12/05/14 0709 12/06/14 0240  HGB 15.6  --   --   --  15.0 14.7  HCT 46.6  --   --   --  45.6 44.0  PLT 295  --   --   --  279 263  LABPROT  --   --   --  14.5  --   --   INR  --   --   --  1.11  --   --   HEPARINUNFRC  --   --   --  0.28*  --  0.27*  CREATININE 1.13  --   --   --  1.38*  --   TROPONINI <0.03 0.19* 2.88*  --   --   --     Estimated Creatinine Clearance: 55.6 mL/min (by C-G formula based on Cr of 1.38).  Assessment: Heparin resumed post-cath, HL is slightly sub-therapeutic   Goal of Therapy:  Heparin level 0.3-0.7 units/ml Monitor platelets by anticoagulation protocol: Yes   Plan:  -Increase heparin to 1550 units/hr -1100 HL  Marshall Kampf 12/06/2014,4:05 AM

## 2014-12-06 NOTE — Progress Notes (Signed)
CARDIAC REHAB PHASE I   PRE:  Rate/Rhythm: 91 nsr  BP:  Sitting: 136/84      SaO2: 95 ra  MODE:  Ambulation: 450 ft   POST:  Rate/Rhythm: 101 st  BP:  Sitting: 143/76     SaO2: 97 ra  [id number] Patient ambulated in hallway independently with Rolator from home. Patient states he uses Rolator intermittently for back pain. Steady gait noted. Patient denied complaints of CP/pressure or dizziness. Post ambulation patient back to bedside sitting. Discharge education completed. Literature given and reviewed with some teachback noted. Encouraged patients daughter to reinforce education. Wife and daughter present for education. All discharge education reviewed. Patient and daughter more receptive to education than wife. Phase 2 cardiac rehab discussed. Patient strongly encouraged to attend. Unsure at this time but gave OK for RN to put in referral to [facility].  Maude LerichePayne, Payeton Germani EnglishRN, BSN 12/06/2014 9:15 AM

## 2014-12-07 NOTE — Discharge Summary (Signed)
Patient ID: Gregory [name],  MRN: [id number], DOB/AGE: [birth year] 69 y.o.  Admit date: 12/04/2014 Discharge date: 12/07/2014  Primary Care Provider: Pershing Proud Primary Cardiologist: Dr Diona Browner  Discharge Diagnoses Principal Problem:   NSTEMI (non-ST elevated myocardial infarction) [facility]) Active Problems:   CAD, multiple vessel   Essential hypertension   Dyslipidemia    Procedures: Coronary angiogram 12/05/14   Hospital Course:  69 y.o.male seen by Dr Diona Browner at [facility] 12/04/14 with [place] and elevated Troponin. He was transferred to [facility] for cath. 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 This represents the culprit vessel. There was moderate diffuse disease slightly proximal to the total occlusion. A branch of the first diagonal contains 90% stenosis. Too small to intervene upon. There was 50-80% distal RCA prior to the origin of the PDA. FFR negative. There was high-grade stenosis in the continuation of the right coronary before the origin of a small left ventricular branch. There was normal LVF. He was placed on Heparin overnight. Troponin peaked at 2.88. He is anxious for discharge the morning of 11/12/1/6. The pt had no further chest pain and his Troponin trended down. He was seen by Dr Mayford Knife the morning of the 12th and felt to be stable for discharge. Nitrates, beta blocker, and Plavix were added. He had been on Lisinopril 40 mg prior to admission but this had been held. His B/P was controlled and this may need to be resumed as an OP. He'll f/u in  in 7-10 days as TOC pt.  Discharge Vitals:  Blood pressure 144/80, pulse 85, temperature 97.9 F (36.6 C), temperature source Oral, resp. rate 18, height 5' 4.17" (1.63 m), weight 220 lb 0.3 oz (99.8 kg), SpO2 95 %.    Labs: Results for orders placed or performed during the hospital encounter of 12/04/14 (from the past 24 hour(s))  Troponin I     Status: Abnormal   Collection Time: 12/06/14  9:14 AM    Result Value Ref Range   Troponin I 1.90 (HH) <0.031 ng/mL    Disposition:  Follow-up Information    Follow up with Nona Dell, MD.   Specialty:  Cardiology   Why:  office will call you   Contact information:   [address] [place] [address] [phone]       Discharge Medications:    Medication List    STOP taking these medications        azithromycin 250 MG tablet  Commonly known as:  ZITHROMAX     lisinopril 40 MG tablet  Commonly known as:  PRINIVIL,ZESTRIL     simvastatin 20 MG tablet  Commonly known as:  ZOCOR      TAKE these medications        acetaminophen 500 MG tablet  Commonly known as:  TYLENOL  Take 1,000 mg by mouth every 6 (six) hours as needed for mild pain.     allopurinol 300 MG tablet  Commonly known as:  ZYLOPRIM  Take 300 mg by mouth daily.     amLODipine 10 MG tablet  Commonly known as:  NORVASC  Take 10 mg by mouth daily.     aspirin 81 MG chewable tablet  Chew 1 tablet (81 mg total) by mouth daily.     atorvastatin 40 MG tablet  Commonly known as:  LIPITOR  Take 40 mg by mouth daily.     benzonatate 100 MG capsule  Commonly known as:  TESSALON  Take 100 mg by  mouth at bedtime.     budesonide-formoterol 160-4.5 MCG/ACT inhaler  Commonly known as:  SYMBICORT  Inhale 2 puffs into the lungs 2 (two) times daily.     cetirizine 10 MG tablet  Commonly known as:  ZYRTEC  Take 10 mg by mouth daily.     clopidogrel 75 MG tablet  Commonly known as:  PLAVIX  Take 1 tablet (75 mg total) by mouth daily with breakfast.     fluticasone 50 MCG/ACT nasal spray  Commonly known as:  FLONASE  Place 1 spray into both nostrils daily as needed for allergies.     isosorbide mononitrate 30 MG 24 hr tablet  Commonly known as:  IMDUR  Take 1 tablet (30 mg total) by mouth daily.     meloxicam 7.5 MG tablet  Commonly known as:  MOBIC  Take 7.5 mg by mouth 2 (two) times daily.     metoprolol tartrate 25 MG tablet  Commonly  known as:  LOPRESSOR  Take 0.5 tablets (12.5 mg total) by mouth 2 (two) times daily.     nitroGLYCERIN 0.4 MG SL tablet  Commonly known as:  NITROSTAT  Place 1 tablet (0.4 mg total) under the tongue every 5 (five) minutes x 3 doses as needed for chest pain.     traMADol 50 MG tablet  Commonly known as:  ULTRAM  Take 50 mg by mouth every 6 (six) hours as needed for moderate pain.         Duration of Discharge Encounter: Greater than 30 minutes including physician time.  Jolene ProvostSigned, Gryphon Vanderveen PA-C 12/07/2014 7:30 AM

## 2014-12-08 ENCOUNTER — Telehealth: Payer: Self-pay | Admitting: Cardiovascular Disease

## 2014-12-08 NOTE — Telephone Encounter (Signed)
Closed encounter °

## 2014-12-16 ENCOUNTER — Encounter: Payer: Medicare Other | Admitting: Adult Health

## 2014-12-16 NOTE — Progress Notes (Signed)
Cardiology Office Note   Date:  12/16/2014   ID:  Alveria ApleyRichard W Vasco, DOB 01/11/1946, MRN [id number]  PCP:  Pershing ProudJACKSON,[name], PA-C  Cardiologist: McDowell/  Joni ReiningKathryn Justyna Timoney, NP   No chief complaint on file.  ERROR Cancelled appt

## 2014-12-29 ENCOUNTER — Ambulatory Visit (INDEPENDENT_AMBULATORY_CARE_PROVIDER_SITE_OTHER): Payer: Medicare Other | Admitting: Adult Health

## 2014-12-29 ENCOUNTER — Encounter: Payer: Self-pay | Admitting: Adult Health

## 2014-12-29 VITALS — BP 128/70 | HR 89 | Ht 64.0 in | Wt 228.0 lb

## 2014-12-29 DIAGNOSIS — I2510 Atherosclerotic heart disease of native coronary artery without angina pectoris: Secondary | ICD-10-CM | POA: Diagnosis not present

## 2014-12-29 DIAGNOSIS — E7800 Pure hypercholesterolemia, unspecified: Secondary | ICD-10-CM

## 2014-12-29 DIAGNOSIS — I10 Essential (primary) hypertension: Secondary | ICD-10-CM | POA: Diagnosis not present

## 2014-12-29 NOTE — Progress Notes (Signed)
Name: Gregory [name]    DOB: 1945-09-20  Age: 69 y.o.  MR#: [id number]       PCP:  Pershing Proud      Insurance: Payor: [employer] MEDICARE / Plan: [employer] MEDICARE / Product Type: *No Product type* /   CC:   No chief complaint on file.   VS Filed Vitals:   12/29/14 1431  BP: 128/70  Pulse: 89  Height:  (1.626 m)  Weight: 228 lb (103.42 kg)  SpO2: 94%    Weights Current Weight  12/29/14 228 lb (103.42 kg)  12/06/14 220 lb 0.3 oz (99.8 kg)  09/27/13 230 lb (104.327 kg)    Blood Pressure  BP Readings from Last 3 Encounters:  12/29/14 128/70  12/06/14 144/80  09/27/13 143/82     Admit date:  (Not on file) Last encounter with RMR:  Visit date not found   Allergy Review of patient's allergies indicates no known allergies.  Current Outpatient Prescriptions  Medication Sig Dispense Refill  . acetaminophen (TYLENOL) 500 MG tablet Take 1,000 mg by mouth every 6 (six) hours as needed for mild pain.    Marland Kitchen allopurinol (ZYLOPRIM) 300 MG tablet Take 300 mg by mouth daily.    Marland Kitchen amLODipine (NORVASC) 10 MG tablet Take 10 mg by mouth daily.    Marland Kitchen aspirin 81 MG chewable tablet Chew 1 tablet (81 mg total) by mouth daily.    Marland Kitchen atorvastatin (LIPITOR) 40 MG tablet Take 40 mg by mouth daily.    . benzonatate (TESSALON) 100 MG capsule Take 100 mg by mouth at bedtime.    . budesonide-formoterol (SYMBICORT) 160-4.5 MCG/ACT inhaler Inhale 2 puffs into the lungs 2 (two) times daily.    . cetirizine (ZYRTEC) 10 MG tablet Take 10 mg by mouth daily.    . clopidogrel (PLAVIX) 75 MG tablet Take 1 tablet (75 mg total) by mouth daily with breakfast. 30 tablet 11  . fluticasone (FLONASE) 50 MCG/ACT nasal spray Place 1 spray into both nostrils daily as needed for allergies.     . isosorbide mononitrate (IMDUR) 30 MG 24 hr tablet Take 1 tablet (30 mg total) by mouth daily. 30 tablet 11  . meloxicam (MOBIC) 7.5 MG tablet Take 7.5 mg by mouth 2 (two) times daily.    . metoprolol tartrate  (LOPRESSOR) 25 MG tablet Take 0.5 tablets (12.5 mg total) by mouth 2 (two) times daily. 30 tablet 11  . nitroGLYCERIN (NITROSTAT) 0.4 MG SL tablet Place 1 tablet (0.4 mg total) under the tongue every 5 (five) minutes x 3 doses as needed for chest pain. 25 tablet 2  . traMADol (ULTRAM) 50 MG tablet Take 50 mg by mouth every 6 (six) hours as needed for moderate pain.     No current facility-administered medications for this visit.    Discontinued Meds:   There are no discontinued medications.  Patient Active Problem List   Diagnosis Date Noted  . NSTEMI (non-ST elevated myocardial infarction) (HCC) 12/06/2014  . CAD, multiple vessel 12/06/2014  . Dyslipidemia 12/06/2014  . Essential hypertension 12/06/2014  . Elevated troponin 12/04/2014  . ACS (acute coronary syndrome) (HCC) 12/04/2014  . History of inguinal hernia repair, 09/09/2012 09/28/2012    LABS    Component Value Date/Time   NA 137 12/06/2014 0240   NA 140 12/05/2014 0709   NA 137 12/04/2014 1038   K 3.6 12/06/2014 0240   K 5.2* 12/05/2014 0709   K 4.1 12/04/2014 1038   CL 107 12/06/2014 0240  CL 101 12/05/2014 0709   CL 103 12/04/2014 1038   CO2 22 12/06/2014 0240   CO2 28 12/05/2014 0709   CO2 25 12/04/2014 1038   GLUCOSE 112* 12/06/2014 0240   GLUCOSE 116* 12/05/2014 0709   GLUCOSE 144* 12/04/2014 1038   BUN 12 12/06/2014 0240   BUN 15 12/05/2014 0709   BUN 17 12/04/2014 1038   CREATININE 1.14 12/06/2014 0240   CREATININE 1.38* 12/05/2014 0709   CREATININE 1.13 12/04/2014 1038   CALCIUM 9.0 12/06/2014 0240   CALCIUM 9.4 12/05/2014 0709   CALCIUM 9.3 12/04/2014 1038   GFRNONAA >60 12/06/2014 0240   GFRNONAA 51* 12/05/2014 0709   GFRNONAA >60 12/04/2014 1038   GFRAA >60 12/06/2014 0240   GFRAA 60* 12/05/2014 0709   GFRAA >60 12/04/2014 1038   CMP     Component Value Date/Time   NA 137 12/06/2014 0240   K 3.6 12/06/2014 0240   CL 107 12/06/2014 0240   CO2 22 12/06/2014 0240   GLUCOSE 112*  12/06/2014 0240   BUN 12 12/06/2014 0240   CREATININE 1.14 12/06/2014 0240   CALCIUM 9.0 12/06/2014 0240   PROT 7.5 09/08/2012 1853   ALBUMIN 3.6 09/08/2012 1853   AST 25 09/08/2012 1853   ALT 22 09/08/2012 1853   ALKPHOS 62 09/08/2012 1853   BILITOT 1.0 09/08/2012 1853   GFRNONAA >60 12/06/2014 0240   GFRAA >60 12/06/2014 0240       Component Value Date/Time   WBC 12.6* 12/06/2014 0240   WBC 13.7* 12/05/2014 0709   WBC 12.9* 12/04/2014 1038   HGB 14.7 12/06/2014 0240   HGB 15.0 12/05/2014 0709   HGB 15.6 12/04/2014 1038   HCT 44.0 12/06/2014 0240   HCT 45.6 12/05/2014 0709   HCT 46.6 12/04/2014 1038   MCV 88.2 12/06/2014 0240   MCV 89.8 12/05/2014 0709   MCV 89.4 12/04/2014 1038    Lipid Panel     Component Value Date/Time   CHOL 117 12/05/2014 0709   TRIG 105 12/05/2014 0709   HDL 30* 12/05/2014 0709   CHOLHDL 3.9 12/05/2014 0709   VLDL 21 12/05/2014 0709   LDLCALC 66 12/05/2014 0709    ABG No results found for: PHART, PCO2ART, PO2ART, HCO3, TCO2, ACIDBASEDEF, O2SAT   No results found for: TSH BNP (last 3 results) No results for input(s): BNP in the last 8760 hours.  ProBNP (last 3 results) No results for input(s): PROBNP in the last 8760 hours.  Cardiac Panel (last 3 results) No results for input(s): CKTOTAL, CKMB, TROPONINI, RELINDX in the last 72 hours.  Iron/TIBC/Ferritin/ %Sat No results found for: IRON, TIBC, FERRITIN, IRONPCTSAT   EKG Orders placed or performed during the hospital encounter of 12/04/14  . EKG 12-Lead  . EKG 12-Lead  . ED EKG  . EKG 12-Lead  . EKG 12-Lead  . EKG 12-Lead  . EKG 12-Lead  . EKG 12-Lead  . EKG  . EKG 12-Lead  . EKG 12-Lead  . EKG 12-Lead  . EKG 12-Lead  . EKG 12-Lead  . EKG 12-Lead  . EKG 12-Lead  . EKG 12-Lead  . EKG 12-Lead     Prior Assessment and Plan Problem List as of 12/29/2014      Cardiovascular and Mediastinum   ACS (acute coronary syndrome) (HCC)   NSTEMI (non-ST elevated myocardial  infarction) (HCC)   CAD, multiple vessel   Essential hypertension     Other   History of inguinal hernia repair, 09/09/2012   Elevated troponin  Dyslipidemia       Imaging: Dg Chest Portable 1 View  12/04/2014  CLINICAL DATA:  Right-sided chest pain radiating into the right arm for 2 hours EXAM: PORTABLE CHEST - 1 VIEW COMPARISON:  10/03/2014 FINDINGS: The heart size and mediastinal contours are within normal limits. Both lungs are clear. The visualized skeletal structures are unremarkable. IMPRESSION: No active disease. Electronically Signed   By: Alcide Clever M.D.   On: 12/04/2014 11:01

## 2014-12-29 NOTE — Patient Instructions (Signed)
Your physician recommends that you schedule a follow-up appointment in: 3 months with a doctor    Your physician recommends that you continue on your current medications as directed. Please refer to the Current Medication list given to you today.     If you need a refill on your cardiac medications before your next appointment, please call your pharmacy.     Thank you for choosing [facility] !

## 2014-12-29 NOTE — Progress Notes (Signed)
Cardiology Office Note   Date:  12/29/2014   ID:  Alveria ApleyRichard W Schrag, DOB 10/15/1945, MRN [id number]  PCP:  Pershing ProudJACKSON,[name], PA-C  Cardiologist:  [place]/ Joni ReiningKathryn Pakou Rainbow, NP   Chief Complaint  Patient presents with  . Coronary Artery Disease      History of Present Illness: Gregory ClicheRichard W [name] is a 69 y.o. male who presents for ongoing assessment and management of CAD with [facility]. Cardiac cath on arrival demonstrated This revealed total occlusion of the apical LAD. This represents the culprit vessel. There was moderate diffuse disease slightly proximal to the total occlusion. A branch of the first diagonal contains 90% stenosis. Too small to intervene upon. There was 50-80% distal RCA prior to the origin of the PDA. FFR negative. There was high-grade stenosis in the continuation of the right coronary before the origin of a small left ventricular branch. There was normal LVF.  He was recommended for medical management with DAPT, BB, statin.   He comes today without cardiac complaints. He is medically compliant. He and his wife have numerous questions about the cardiac cath and his medications.    Past Medical History  Diagnosis Date  . Essential hypertension   . Pneumothorax     Associated with pneumonia  . Gout     Past Surgical History  Procedure Laterality Date  . Arm surgery    . Inguinal hernia repair Right 09/09/2012    Procedure: HERNIA REPAIR INGUINAL INCARCERATED;  Surgeon: Kandis Cockingavid H Newman, MD;  Location: [facility];  Service: General;  Laterality: Right;  . Insertion of mesh Right 09/09/2012    Procedure: INSERTION OF MESH;  Surgeon: Kandis Cockingavid H Newman, MD;  Location: [facility];  Service: General;  Laterality: Right;  . Cataract extraction w/phaco Right 01/14/2013    Procedure: RIGHT EYE CATARACT EXTRACTION PHACO AND INTRAOCULAR LENS PLACEMENT ;  Surgeon: Gemma PayorKerry Hunt, MD;  Location: [facility];  Service: Ophthalmology;  Laterality: Right;  CDE 54.97  . Cataract  extraction w/phaco Left 02/18/2013    Procedure: CATARACT EXTRACTION PHACO AND INTRAOCULAR LENS PLACEMENT (IOC);  Surgeon: Gemma PayorKerry Hunt, MD;  Location: [facility];  Service: Ophthalmology;  Laterality: Left;  CDE 34.02  . Cardiac catheterization N/A 12/05/2014    Procedure: Left Heart Cath and Coronary Angiography;  Surgeon: Lyn RecordsHenry W Smith, MD;  [facility];  Service: Cardiovascular;  Laterality: N/A;     Current Outpatient Prescriptions  Medication Sig Dispense Refill  . acetaminophen (TYLENOL) 500 MG tablet Take 1,000 mg by mouth every 6 (six) hours as needed for mild pain.    Marland Kitchen. allopurinol (ZYLOPRIM) 300 MG tablet Take 300 mg by mouth daily.    Marland Kitchen. amLODipine (NORVASC) 10 MG tablet Take 10 mg by mouth daily.    Marland Kitchen. aspirin 81 MG chewable tablet Chew 1 tablet (81 mg total) by mouth daily.    Marland Kitchen. atorvastatin (LIPITOR) 40 MG tablet Take 40 mg by mouth daily.    . benzonatate (TESSALON) 100 MG capsule Take 100 mg by mouth at bedtime.    . budesonide-formoterol (SYMBICORT) 160-4.5 MCG/ACT inhaler Inhale 2 puffs into the lungs 2 (two) times daily.    . cetirizine (ZYRTEC) 10 MG tablet Take 10 mg by mouth daily.    . clopidogrel (PLAVIX) 75 MG tablet Take 1 tablet (75 mg total) by mouth daily with breakfast. 30 tablet 11  . fluticasone (FLONASE) 50 MCG/ACT nasal spray Place 1 spray into both nostrils daily as needed for allergies.     .Marland Kitchen  isosorbide mononitrate (IMDUR) 30 MG 24 hr tablet Take 1 tablet (30 mg total) by mouth daily. 30 tablet 11  . meloxicam (MOBIC) 7.5 MG tablet Take 7.5 mg by mouth 2 (two) times daily.    . metoprolol tartrate (LOPRESSOR) 25 MG tablet Take 0.5 tablets (12.5 mg total) by mouth 2 (two) times daily. 30 tablet 11  . nitroGLYCERIN (NITROSTAT) 0.4 MG SL tablet Place 1 tablet (0.4 mg total) under the tongue every 5 (five) minutes x 3 doses as needed for chest pain. 25 tablet 2  . traMADol (ULTRAM) 50 MG tablet Take 50 mg by mouth every 6 (six) hours as needed for  moderate pain.     No current facility-administered medications for this visit.    Allergies:   Review of patient's allergies indicates no known allergies.    Social History:  The patient  reports that he quit smoking about 36 years ago. His smoking use included Cigarettes. He does not have any smokeless tobacco history on file. He reports that he does not drink alcohol or use illicit drugs.   Family History:  The patient's family history includes COPD in his father; Stroke in his brother.    ROS: All other systems are reviewed and negative. Unless otherwise mentioned in H&P    PHYSICAL EXAM: VS:  BP 128/70 mmHg  Pulse 89  Ht  (1.626 m)  Wt 228 lb (103.42 kg)  BMI 39.12 kg/m2  SpO2 94% , BMI Body mass index is 39.12 kg/(m^2). GEN: Well nourished, well developed, in no acute distress, obese  HEENT: normal Neck: no JVD, carotid bruits, or masses Cardiac: RRR; no murmurs, rubs, or gallops,no edema  Respiratory:  clear to auscultation bilaterally, normal work of breathing GI: soft, nontender, nondistended, + BS MS: no deformity or atrophyWearing back brace. Skin: warm and dry, no rash Neuro:  Strength and sensation are intact Psych: euthymic mood, full affect  Recent Labs: 12/06/2014: BUN 12; Creatinine, Ser 1.14; Hemoglobin 14.7; Platelets 263; Potassium 3.6; Sodium 137    Lipid Panel    Component Value Date/Time   CHOL 117 12/05/2014 0709   TRIG 105 12/05/2014 0709   HDL 30* 12/05/2014 0709   CHOLHDL 3.9 12/05/2014 0709   VLDL 21 12/05/2014 0709   LDLCALC 66 12/05/2014 0709      Wt Readings from Last 3 Encounters:  12/29/14 228 lb (103.42 kg)  12/06/14 220 lb 0.3 oz (99.8 kg)  09/27/13 230 lb (104.327 kg)      ASSESSMENT AND PLAN:  1.  CAD: Multivessel. Treated medically with DAPT. I have answered numerous questions and explained his heart disease. I have given him a copy of the cath report. WE have reviewed his medications in detail. He verbalizes  understanding. He will need to be established with a local cardiologist. He saw Dr. Diona Browner once in the hospital. Will have him established with Dr. Purvis Sheffield or Dr. Wyline Mood. Continue BB, long acting nitrates.   2. Hypercholesterolemia: Continue statin.   3. Hypertension; Continue amlodipine. So far he is stable.    Current medicines are reviewed at length with the patient today.    Labs/ tests ordered today include: No orders of the defined types were placed in this encounter.     Disposition:   FU with 3 months with cardiologist.  Signed, Joni Reining, NP  12/29/2014 3:42 PM    [facility] [id number] [place], [place] [address] Phone: [phone]; Fax: [phone]

## 2015-03-07 ENCOUNTER — Emergency Department (HOSPITAL_COMMUNITY): Payer: Medicare Other

## 2015-03-07 ENCOUNTER — Emergency Department (HOSPITAL_COMMUNITY)
Admission: EM | Admit: 2015-03-07 | Discharge: 2015-03-07 | Disposition: A | Discharge status: Home / Self Care | Payer: Medicare Other | Attending: Emergency Medicine | Admitting: Emergency Medicine

## 2015-03-07 ENCOUNTER — Encounter (HOSPITAL_COMMUNITY): Payer: Self-pay | Admitting: Emergency Medicine

## 2015-03-07 DIAGNOSIS — Z7902 Long term (current) use of antithrombotics/antiplatelets: Secondary | ICD-10-CM | POA: Insufficient documentation

## 2015-03-07 DIAGNOSIS — R109 Unspecified abdominal pain: Secondary | ICD-10-CM | POA: Insufficient documentation

## 2015-03-07 DIAGNOSIS — I2510 Atherosclerotic heart disease of native coronary artery without angina pectoris: Secondary | ICD-10-CM | POA: Insufficient documentation

## 2015-03-07 DIAGNOSIS — M109 Gout, unspecified: Secondary | ICD-10-CM | POA: Diagnosis not present

## 2015-03-07 DIAGNOSIS — Z791 Long term (current) use of non-steroidal anti-inflammatories (NSAID): Secondary | ICD-10-CM | POA: Diagnosis not present

## 2015-03-07 DIAGNOSIS — Z79899 Other long term (current) drug therapy: Secondary | ICD-10-CM | POA: Insufficient documentation

## 2015-03-07 DIAGNOSIS — J189 Pneumonia, unspecified organism: Secondary | ICD-10-CM

## 2015-03-07 DIAGNOSIS — I252 Old myocardial infarction: Secondary | ICD-10-CM | POA: Insufficient documentation

## 2015-03-07 DIAGNOSIS — Z87891 Personal history of nicotine dependence: Secondary | ICD-10-CM | POA: Diagnosis not present

## 2015-03-07 DIAGNOSIS — Z9889 Other specified postprocedural states: Secondary | ICD-10-CM | POA: Insufficient documentation

## 2015-03-07 DIAGNOSIS — J159 Unspecified bacterial pneumonia: Secondary | ICD-10-CM | POA: Diagnosis not present

## 2015-03-07 DIAGNOSIS — Z7982 Long term (current) use of aspirin: Secondary | ICD-10-CM | POA: Diagnosis not present

## 2015-03-07 DIAGNOSIS — Z7951 Long term (current) use of inhaled steroids: Secondary | ICD-10-CM | POA: Diagnosis not present

## 2015-03-07 DIAGNOSIS — I10 Essential (primary) hypertension: Secondary | ICD-10-CM | POA: Insufficient documentation

## 2015-03-07 HISTORY — DX: Old myocardial infarction: I25.2

## 2015-03-07 HISTORY — DX: Atherosclerotic heart disease of native coronary artery without angina pectoris: I25.10

## 2015-03-07 LAB — URINE MICROSCOPIC-ADD ON

## 2015-03-07 LAB — COMPREHENSIVE METABOLIC PANEL
ALBUMIN: 4 g/dL (ref 3.5–5.0)
ALT: 49 U/L (ref 17–63)
ANION GAP: 9 (ref 5–15)
AST: 49 U/L — ABNORMAL HIGH (ref 15–41)
Alkaline Phosphatase: 62 U/L (ref 38–126)
BILIRUBIN TOTAL: 1 mg/dL (ref 0.3–1.2)
BUN: 19 mg/dL (ref 6–20)
CO2: 23 mmol/L (ref 22–32)
Calcium: 9 mg/dL (ref 8.9–10.3)
Chloride: 106 mmol/L (ref 101–111)
Creatinine, Ser: 0.98 mg/dL (ref 0.61–1.24)
GFR calc Af Amer: >60 mL/min (ref >60)
GFR calc non Af Amer: >60 mL/min (ref >60)
GLUCOSE: 102 mg/dL — AB (ref 65–99)
POTASSIUM: 4 mmol/L (ref 3.5–5.1)
SODIUM: 138 mmol/L (ref 135–145)
TOTAL PROTEIN: 7.5 g/dL (ref 6.5–8.1)

## 2015-03-07 LAB — CBC WITH DIFFERENTIAL/PLATELET
BASOS PCT: 0 %
Basophils Absolute: 0.1 10*3/uL (ref 0.0–0.1)
Eosinophils Absolute: 0.9 10*3/uL — ABNORMAL HIGH (ref 0.0–0.7)
Eosinophils Relative: 7 %
HEMATOCRIT: 49.4 % (ref 39.0–52.0)
HEMOGLOBIN: 16.5 g/dL (ref 13.0–17.0)
Lymphocytes Relative: 33 %
Lymphs Abs: 3.9 10*3/uL (ref 0.7–4.0)
MCH: 30 pg (ref 26.0–34.0)
MCHC: 33.4 g/dL (ref 30.0–36.0)
MCV: 89.8 fL (ref 78.0–100.0)
MONOS PCT: 8 %
Monocytes Absolute: 0.9 10*3/uL (ref 0.1–1.0)
NEUTROS ABS: 6.1 10*3/uL (ref 1.7–7.7)
NEUTROS PCT: 52 %
Platelets: 264 10*3/uL (ref 150–400)
RBC: 5.5 MIL/uL (ref 4.22–5.81)
RDW: 15 % (ref 11.5–15.5)
WBC: 11.9 10*3/uL — AB (ref 4.0–10.5)

## 2015-03-07 LAB — URINALYSIS, ROUTINE W REFLEX MICROSCOPIC
Bilirubin Urine: NEGATIVE
Glucose, UA: NEGATIVE mg/dL
Ketones, ur: NEGATIVE mg/dL
LEUKOCYTES UA: NEGATIVE
NITRITE: NEGATIVE
PH: 6 (ref 5.0–8.0)
Protein, ur: NEGATIVE mg/dL

## 2015-03-07 LAB — LIPASE, BLOOD: Lipase: 32 U/L (ref 11–51)

## 2015-03-07 MED ORDER — SODIUM CHLORIDE 0.9 % IV BOLUS (SEPSIS)
1000.0000 mL | Freq: Once | INTRAVENOUS | Status: AC
  Administered 2015-03-07: 1000 mL via INTRAVENOUS

## 2015-03-07 MED ORDER — SODIUM CHLORIDE 0.9 % IV SOLN: INTRAVENOUS | Status: DC

## 2015-03-07 MED ORDER — ONDANSETRON HCL 4 MG/2ML IJ SOLN
4.0000 mg | Freq: Once | INTRAMUSCULAR | Status: AC
  Administered 2015-03-07: 4 mg via INTRAVENOUS
  Filled 2015-03-07: qty 2

## 2015-03-07 MED ORDER — TRAMADOL HCL 50 MG PO TABS: 50.0000 mg | ORAL_TABLET | Freq: Two times a day (BID) | ORAL | Status: DC | PRN

## 2015-03-07 MED ORDER — DOXYCYCLINE HYCLATE 100 MG PO CAPS: 100.0000 mg | ORAL_CAPSULE | Freq: Two times a day (BID) | ORAL | Status: DC

## 2015-03-07 MED ORDER — DOXYCYCLINE HYCLATE 100 MG PO TABS
100.0000 mg | ORAL_TABLET | Freq: Once | ORAL | Status: AC
  Administered 2015-03-07: 100 mg via ORAL
  Filled 2015-03-07: qty 1

## 2015-03-07 MED ORDER — HYDROMORPHONE HCL 1 MG/ML IJ SOLN
1.0000 mg | Freq: Once | INTRAMUSCULAR | Status: AC
  Administered 2015-03-07: 1 mg via INTRAVENOUS
  Filled 2015-03-07: qty 1

## 2015-03-07 NOTE — Discharge Instructions (Signed)

## 2015-03-07 NOTE — ED Notes (Signed)
Pt states that his left flank started hurting yesterday.  Denies other symptoms.

## 2015-03-07 NOTE — ED Provider Notes (Signed)
CSN: [id number]     Arrival date & time 03/07/15  1437 History   First MD Initiated Contact with Patient 03/07/15 1503     Chief Complaint  Patient presents with  . Flank Pain   HPI Comments: Pt noticed sharp pain in his left flank.  He does not recall any particular injuries.  He was not doing anything in particular.  The pain does not radiate.  No abdominal pain.  No groin pain.   The pain does increase with coughing and sometimes with deep breathing.  No chest pain.   Patient is a 70 y.o. male presenting with flank pain. The history is provided by the patient.  Flank Pain This is a new problem. The current episode started yesterday. The problem occurs constantly. The problem has not changed since onset.Pertinent negatives include no chest pain.    Past Medical History  Diagnosis Date  . Essential hypertension   . Pneumothorax     Associated with pneumonia  . Gout   . Coronary artery disease   . 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 Arm surgery    . Inguinal hernia repair Right 09/09/2012    Procedure: HERNIA REPAIR INGUINAL INCARCERATED;  Surgeon: Kandis Cocking, MD;  Location: [facility];  Service: General;  Laterality: Right;  . Insertion of mesh Right 09/09/2012    Procedure: INSERTION OF MESH;  Surgeon: Kandis Cocking, MD;  Location: [facility];  Service: General;  Laterality: Right;  . Cataract extraction w/phaco Right 01/14/2013    Procedure: RIGHT EYE CATARACT EXTRACTION PHACO AND INTRAOCULAR LENS PLACEMENT ;  Surgeon: Gemma Payor, MD;  Location: [facility];  Service: Ophthalmology;  Laterality: Right;  CDE 54.97  . Cataract extraction w/phaco Left 02/18/2013    Procedure: CATARACT EXTRACTION PHACO AND INTRAOCULAR LENS PLACEMENT (IOC);  Surgeon: Gemma Payor, MD;  Location: [facility];  Service: Ophthalmology;  Laterality: Left;  CDE 34.02  . Cardiac catheterization N/A 12/05/2014    Procedure: Left Heart Cath and Coronary Angiography;   Surgeon: Lyn Records, MD;  Location: [facility] INVASIVE CV LAB;  Service: Cardiovascular;  Laterality: N/A;   Family History  Problem Relation Age of Onset  . Stroke Brother   . COPD Father    Social History  Substance Use Topics  . Smoking status: Former Smoker    Types: Cigarettes    Quit date: 01/28/1978  . Smokeless tobacco: None  . Alcohol Use: No    Review of Systems  Cardiovascular: Negative for chest pain.  Gastrointestinal: Negative for vomiting and diarrhea.  Genitourinary: Positive for flank pain.  All other systems reviewed and are negative.     Allergies  Review of patient's allergies indicates no known allergies.  Home Medications   Prior to Admission medications   Medication Sig Start Date End Date Taking? Authorizing Provider  acetaminophen (TYLENOL) 500 MG tablet Take 1,000 mg by mouth every 6 (six) hours as needed for mild pain.   Yes Historical Provider, MD  allopurinol (ZYLOPRIM) 300 MG tablet Take 300 mg by mouth daily. 09/25/13  Yes Historical Provider, MD  amLODipine (NORVASC) 10 MG tablet Take 10 mg by mouth daily.   Yes Historical Provider, MD  aspirin 81 MG chewable tablet Chew 1 tablet (81 mg total) by mouth daily. 12/06/14  Yes Luke K Kilroy, PA-C  atorvastatin (LIPITOR) 40 MG tablet Take 40 mg by mouth daily.   Yes Historical Provider, MD  budesonide-formoterol [facility])  160-4.5 MCG/ACT inhaler Inhale 2 puffs into the lungs 2 (two) times daily.   Yes Historical Provider, MD  cetirizine (ZYRTEC) 10 MG tablet Take 10 mg by mouth daily.   Yes Historical Provider, MD  clopidogrel (PLAVIX) 75 MG tablet Take 1 tablet (75 mg total) by mouth daily with breakfast. 12/06/14  Yes Eda Paschal Kilroy, PA-C  fluticasone (FLONASE) 50 MCG/ACT nasal spray Place 1 spray into both nostrils daily as needed for allergies.  11/18/14  Yes Historical Provider, MD  isosorbide mononitrate (IMDUR) 30 MG 24 hr tablet Take 1 tablet (30 mg total) by mouth daily. 12/06/14  Yes Luke K  Kilroy, PA-C  lisinopril (PRINIVIL,ZESTRIL) 40 MG tablet Take 40 mg by mouth daily.   Yes Historical Provider, MD  meloxicam (MOBIC) 7.5 MG tablet Take 7.5 mg by mouth 2 (two) times daily.   Yes Historical Provider, MD  metoprolol tartrate (LOPRESSOR) 25 MG tablet Take 0.5 tablets (12.5 mg total) by mouth 2 (two) times daily. 12/06/14  Yes Luke K Kilroy, PA-C  doxycycline (VIBRAMYCIN) 100 MG capsule Take 1 capsule (100 mg total) by mouth 2 (two) times daily. 03/07/15   Linwood Dibbles, MD  nitroGLYCERIN (NITROSTAT) 0.4 MG SL tablet Place 1 tablet (0.4 mg total) under the tongue every 5 (five) minutes x 3 doses as needed for chest pain. 12/06/14   Abelino Derrick, PA-C  traMADol (ULTRAM) 50 MG tablet Take 1 tablet (50 mg total) by mouth every 12 (twelve) hours as needed for moderate pain. 03/07/15   Linwood Dibbles, MD   BP 127/34 mmHg  Pulse 76  Temp(Src) 98.2 F (36.8 C)  Resp 20  Ht 5\' 4"  (1.626 m)  Wt 108.863 kg  BMI 41.18 kg/m2  SpO2 97% Physical Exam  Constitutional: He appears well-developed and well-nourished. No distress.  HENT:  Head: Normocephalic and atraumatic.  Right Ear: External ear normal.  Left Ear: External ear normal.  Eyes: Conjunctivae are normal. Right eye exhibits no discharge. Left eye exhibits no discharge. No scleral icterus.  Neck: Neck supple. No tracheal deviation present.  Cardiovascular: Normal rate, regular rhythm and intact distal pulses.   Pulmonary/Chest: Effort normal and breath sounds normal. No stridor. No respiratory distress. He has no wheezes. He has no rales.  ttp inferior to left rib margin  Abdominal: Soft. Bowel sounds are normal. He exhibits no distension. There is no tenderness. There is no rebound and no guarding.  Musculoskeletal: He exhibits no edema or tenderness.  paraspinal muscle ttp, sitting up in the gurney increases pain  Neurological: He is alert. He has normal strength. No cranial nerve deficit (no facial droop, extraocular movements intact,  no slurred speech) or sensory deficit. He exhibits normal muscle tone. He displays no seizure activity. Coordination normal.  Skin: Skin is warm and dry. No rash noted.  Psychiatric: He has a normal mood and affect.  Nursing note and vitals reviewed.   ED Course  Procedures (including critical care time) Labs Review Labs Reviewed  COMPREHENSIVE METABOLIC PANEL - Abnormal; Notable for the following:    Glucose, Bld 102 (*)    AST 49 (*)    All other components within normal limits  CBC WITH DIFFERENTIAL/PLATELET - Abnormal; Notable for the following:    WBC 11.9 (*)    Eosinophils Absolute 0.9 (*)    All other components within normal limits  URINALYSIS, ROUTINE W REFLEX MICROSCOPIC (NOT AT [facility]) - Abnormal; Notable for the following:    Specific Gravity, Urine <1.005 (*)  Hgb urine dipstick TRACE (*)    All other components within normal limits  URINE MICROSCOPIC-ADD ON - Abnormal; Notable for the following:    Squamous Epithelial / LPF 0-5 (*)    Bacteria, UA RARE (*)    All other components within normal limits  LIPASE, BLOOD    Imaging Review Dg Chest 2 View  03/07/2015  CLINICAL DATA:  Left lower back pain x1 day EXAM: CHEST  2 VIEW COMPARISON:  12/04/2014 FINDINGS: Mild patchy opacity, posterior on the lateral view and not visible on the frontal radiograph, possibly reflecting mild lower lobe pneumonia. Laterality (left versus right) is unclear. No pleural effusion or pneumothorax. Mild cardiomegaly. Visualized osseous structures are within normal limits. IMPRESSION: Mild patchy lower lobe opacity on the lateral view, possibly reflecting lower lobe pneumonia. Electronically Signed   By: Charline Bills M.D.   On: 03/07/2015 16:23   Ct Renal Stone Study  03/07/2015  CLINICAL DATA:  Left flank pain starting yesterday. History of right inguinal hernia repair. EXAM: CT ABDOMEN AND PELVIS WITHOUT CONTRAST TECHNIQUE: Multidetector CT imaging of the abdomen and pelvis was  performed following the standard protocol without IV contrast. COMPARISON:  CT abdomen dated 09/08/2012. FINDINGS: Lower chest:  No acute findings. Hepatobiliary: No mass visualized on this un-enhanced exam. Pancreas: No mass or inflammatory process identified on this un-enhanced exam. Spleen: Within normal limits in size. Adrenals/Urinary Tract: Adrenal glands appear normal. Stable benign cyst exophytic to the upper pole of the right kidney measures 3.6 cm greatest dimension. Stable small cysts within the left kidney were better seen on the previous contrast- enhanced study. No renal stone or hydronephrosis bilaterally. No ureteral or bladder calculi identified. Bladder is unremarkable. Stomach/Bowel: Bowel is normal in caliber. Scattered mild diverticulosis within the colon without evidence of acute diverticulitis. No bowel wall thickening or evidence of bowel wall inflammation. Appendix is normal. Stomach is unremarkable. Vascular/Lymphatic: Scattered atherosclerotic changes of the normal- caliber abdominal aorta and pelvic vasculature. No enlarged lymph nodes seen within the abdomen or pelvis. Reproductive: Mild prominence of the prostate gland, stable. Other: Status post right inguinal hernia repair. Small left inguinal hernia which contains fat only. No free fluid or abscess collections seen within the abdomen or pelvis. No free intraperitoneal air. Musculoskeletal: No suspicious bone lesions identified. No acute osseous abnormality. Degenerative changes of the spine, mild to moderate in degree. Associated disc bulges at the L3-4 through L5-S1 levels causing moderate central canal stenoses with possible associated nerve root impingement. IMPRESSION: 1. No acute findings. No renal or ureteral calculi. No free fluid or inflammatory change. No bowel obstruction. 2. Colonic diverticulosis without evidence of acute diverticulitis. 3. Bilateral renal cysts. 4. Degenerative changes of the lumbar spine, as detailed  above. No acute osseous abnormality. 5. Status post right inguinal hernia repair. Persistent left inguinal hernia which contains fat only. Electronically Signed   By: Bary Brix M.D.   On: 03/07/2015 18:50   I have personally reviewed and evaluated these images and lab results as part of my medical decision-making.   Medications  sodium chloride 0.9 % bolus 1,000 mL (1,000 mLs Intravenous New Bag/Given 03/07/15 1551)    And  0.9 %  sodium chloride infusion (not administered)  ondansetron (ZOFRAN) injection 4 mg (4 mg Intravenous Given 03/07/15 1544)  HYDROmorphone (DILAUDID) injection 1 mg (1 mg Intravenous Given 03/07/15 1544)    MDM   Final diagnoses:  Flank pain  CAP (community acquired pneumonia)    Possible PNA on xray.  No acute findings noted on CT scan.  Labs unremarkable.  Pain may be muscular in nature.    Will dc home on oral abx and pain med to cover for the cxr finding.    Linwood Dibbles, MD 03/07/15 [phone]

## 2015-04-21 ENCOUNTER — Encounter: Payer: Self-pay | Admitting: Cardiology

## 2015-04-21 ENCOUNTER — Encounter: Payer: Medicare Other | Admitting: Cardiology

## 2015-04-21 NOTE — Progress Notes (Signed)
Patient ID: Arneta ClicheRichard W Austill, male   DOB: 08/03/1945, 70 y.o.   MRN: [id number]     Clinical Summary Mr. Rosana BergerBelton is a 70 y.o.male last seen by NP Lyman BishopLawrence, this is our first visit together. He is seen for the following medical problems.    1. CAD - NSTEMI 11/2014 with cath findings as reported below. Culprit was occluded apical LAD that was medically managed.  - 11/2014 echo LVEF 60-65%, grade I diastolic dysfunction  2. HTN    Past Medical History  Diagnosis Date  . Essential hypertension   . Pneumothorax     Associated with pneumonia  . Gout   . Coronary artery disease   . 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 acetaminophen (TYLENOL) 500 MG tablet Take 1,000 mg by mouth every 6 (six) hours as needed for mild pain.    Marland Kitchen. allopurinol (ZYLOPRIM) 300 MG tablet Take 300 mg by mouth daily.    Marland Kitchen. amLODipine (NORVASC) 10 MG tablet Take 10 mg by mouth daily.    Marland Kitchen. aspirin 81 MG chewable tablet Chew 1 tablet (81 mg total) by mouth daily.    Marland Kitchen. atorvastatin (LIPITOR) 40 MG tablet Take 40 mg by mouth daily.    . budesonide-formoterol (SYMBICORT) 160-4.5 MCG/ACT inhaler Inhale 2 puffs into the lungs 2 (two) times daily.    . cetirizine (ZYRTEC) 10 MG tablet Take 10 mg by mouth daily.    . clopidogrel (PLAVIX) 75 MG tablet Take 1 tablet (75 mg total) by mouth daily with breakfast. 30 tablet 11  . doxycycline (VIBRAMYCIN) 100 MG capsule Take 1 capsule (100 mg total) by mouth 2 (two) times daily. 20 capsule 0  . fluticasone (FLONASE) 50 MCG/ACT nasal spray Place 1 spray into both nostrils daily as needed for allergies.     . isosorbide mononitrate (IMDUR) 30 MG 24 hr tablet Take 1 tablet (30 mg total) by mouth daily. 30 tablet 11  . lisinopril (PRINIVIL,ZESTRIL) 40 MG tablet Take 40 mg by mouth daily.    . meloxicam (MOBIC) 7.5 MG tablet Take 7.5 mg by mouth 2 (two) times daily.    . metoprolol tartrate  (LOPRESSOR) 25 MG tablet Take 0.5 tablets (12.5 mg total) by mouth 2 (two) times daily. 30 tablet 11  . nitroGLYCERIN (NITROSTAT) 0.4 MG SL tablet Place 1 tablet (0.4 mg total) under the tongue every 5 (five) minutes x 3 doses as needed for chest pain. 25 tablet 2  . traMADol (ULTRAM) 50 MG tablet Take 1 tablet (50 mg total) by mouth every 12 (twelve) hours as needed for moderate pain. 20 tablet 0   No current facility-administered medications for this visit.     Past Surgical History  Procedure Laterality Date  . Arm surgery    . Inguinal hernia repair Right 09/09/2012    Procedure: HERNIA REPAIR INGUINAL INCARCERATED;  Surgeon: Kandis Cockingavid H Newman, MD;  Location: [facility];  Service: General;  Laterality: Right;  . Insertion of mesh Right 09/09/2012    Procedure: INSERTION OF MESH;  Surgeon: Kandis Cockingavid H Newman, MD;  Location: [facility];  Service: General;  Laterality: Right;  . Cataract extraction w/phaco Right 01/14/2013    Procedure: RIGHT EYE CATARACT EXTRACTION PHACO AND INTRAOCULAR LENS PLACEMENT ;  Surgeon: Gemma PayorKerry Hunt, MD;  Location: [facility];  Service: Ophthalmology;  Laterality: Right;  CDE 54.97  . Cataract extraction w/phaco Left 02/18/2013  Procedure: CATARACT EXTRACTION PHACO AND INTRAOCULAR LENS PLACEMENT (IOC);  Surgeon: Gemma Payor, MD;  Location: [facility];  Service: Ophthalmology;  Laterality: Left;  CDE 34.02  . Cardiac catheterization N/A 12/05/2014    Procedure: Left Heart Cath and Coronary Angiography;  Surgeon: Lyn Records, MD;  Location: [facility] INVASIVE CV LAB;  Service: Cardiovascular;  Laterality: N/A;     No Known Allergies    Family History  Problem Relation Age of Onset  . Stroke Brother   . COPD Father      Social History Mr. Witherspoon reports that he quit smoking about 37 years ago. His smoking use included Cigarettes. He does not have any smokeless tobacco history on file. Mr. Hovater reports that he does not drink alcohol.   Review of Systems CONSTITUTIONAL: No weight  loss, fever, chills, weakness or fatigue.  HEENT: Eyes: No visual loss, blurred vision, double vision or yellow sclerae.No hearing loss, sneezing, congestion, runny nose or sore throat.  SKIN: No rash or itching.  CARDIOVASCULAR:  RESPIRATORY: No shortness of breath, cough or sputum.  GASTROINTESTINAL: No anorexia, nausea, vomiting or diarrhea. No abdominal pain or blood.  GENITOURINARY: No burning on urination, no polyuria NEUROLOGICAL: No headache, dizziness, syncope, paralysis, ataxia, numbness or tingling in the extremities. No change in bowel or bladder control.  MUSCULOSKELETAL: No muscle, back pain, joint pain or stiffness.  LYMPHATICS: No enlarged nodes. No history of splenectomy.  PSYCHIATRIC: No history of depression or anxiety.  ENDOCRINOLOGIC: No reports of sweating, cold or heat intolerance. No polyuria or polydipsia.  Marland Kitchen   Physical Examination There were no vitals filed for this visit. There were no vitals filed for this visit.  Gen: resting comfortably, no acute distress HEENT: no scleral icterus, pupils equal round and reactive, no palptable cervical adenopathy,  CV Resp: Clear to auscultation bilaterally GI: abdomen is soft, non-tender, non-distended, normal bowel sounds, no hepatosplenomegaly MSK: extremities are warm, no edema.  Skin: warm, no rash Neuro:  no focal deficits Psych: appropriate affect   Diagnostic Studies 11/2014 cath  Total occlusion of the apical LAD. This represents the culprit vessel. Moderate diffuse disease slightly proximal to the total occlusion. A Sahvanna Mcmanigal of the first diagonal contains 90% stenosis. 2 small to intervene upon.  Eccentric 50-80% distal RCA prior to the origin of the PDA. FFR negative with a value of 0.92. High-grade stenosis in the continuation of the right coronary before the origin of a small left ventricular Latavia Goga.  Luminal irregularities in the circumflex  Low normal LV function with apical moderate  hypokinesis.    Assessment and Plan        Antoine Poche, M.D., F.A.C.C.

## 2015-10-08 NOTE — Progress Notes (Signed)
This encounter was created in error - please disregard.

## 2015-11-26 ENCOUNTER — Other Ambulatory Visit: Payer: Self-pay | Admitting: Cardiology

## 2015-12-25 ENCOUNTER — Other Ambulatory Visit: Payer: Self-pay | Admitting: Cardiology

## 2015-12-25 ENCOUNTER — Other Ambulatory Visit: Payer: Self-pay | Admitting: Adult Health

## 2015-12-28 ENCOUNTER — Other Ambulatory Visit: Payer: Self-pay | Admitting: Adult Health

## 2016-01-21 ENCOUNTER — Other Ambulatory Visit: Payer: Self-pay | Admitting: Cardiology

## 2016-01-21 NOTE — Telephone Encounter (Signed)
REFILL 

## 2016-03-15 ENCOUNTER — Other Ambulatory Visit: Payer: Self-pay | Admitting: Cardiology

## 2016-04-17 ENCOUNTER — Emergency Department (HOSPITAL_COMMUNITY): Payer: Medicare Other

## 2016-04-17 ENCOUNTER — Encounter (HOSPITAL_COMMUNITY): Payer: Self-pay | Admitting: Emergency Medicine

## 2016-04-17 ENCOUNTER — Emergency Department (HOSPITAL_COMMUNITY)
Admission: EM | Admit: 2016-04-17 | Discharge: 2016-04-17 | Disposition: A | Discharge status: Home / Self Care | Payer: Medicare Other | Attending: Emergency Medicine | Admitting: Emergency Medicine

## 2016-04-17 DIAGNOSIS — Z87891 Personal history of nicotine dependence: Secondary | ICD-10-CM | POA: Insufficient documentation

## 2016-04-17 DIAGNOSIS — I2510 Atherosclerotic heart disease of native coronary artery without angina pectoris: Secondary | ICD-10-CM | POA: Insufficient documentation

## 2016-04-17 DIAGNOSIS — Z79899 Other long term (current) drug therapy: Secondary | ICD-10-CM | POA: Diagnosis not present

## 2016-04-17 DIAGNOSIS — M545 Low back pain: Secondary | ICD-10-CM | POA: Insufficient documentation

## 2016-04-17 DIAGNOSIS — I252 Old myocardial infarction: Secondary | ICD-10-CM | POA: Diagnosis not present

## 2016-04-17 DIAGNOSIS — G8929 Other chronic pain: Secondary | ICD-10-CM | POA: Insufficient documentation

## 2016-04-17 DIAGNOSIS — I10 Essential (primary) hypertension: Secondary | ICD-10-CM | POA: Diagnosis not present

## 2016-04-17 MED ORDER — CYCLOBENZAPRINE HCL 5 MG PO TABS: 5.0000 mg | ORAL_TABLET | Freq: Three times a day (TID) | ORAL | 0 refills | Status: DC | PRN

## 2016-04-17 MED ORDER — OXYCODONE-ACETAMINOPHEN 5-325 MG PO TABS
1.0000 | ORAL_TABLET | Freq: Once | ORAL | Status: AC
  Administered 2016-04-17: 1 via ORAL
  Filled 2016-04-17: qty 1

## 2016-04-17 MED ORDER — CYCLOBENZAPRINE HCL 10 MG PO TABS
10.0000 mg | ORAL_TABLET | Freq: Once | ORAL | Status: AC
  Administered 2016-04-17: 10 mg via ORAL
  Filled 2016-04-17: qty 1

## 2016-04-17 NOTE — ED Notes (Signed)
Patient transported to X-ray 

## 2016-04-17 NOTE — ED Provider Notes (Signed)
[facility]-EM[facility]PT Provider Note   CSN: [id number] Arrival date & time: 04/17/16  1140     History   Chief Complaint Chief Complaint  Patient presents with  . Back Pain    HPI Gregory [name] is a 71 y.o. male who presents to the ED with back pain. The pain started 5 days ago and has gotten worsen. Patient complains of pain going into the left buttock. He has been taking tylenol without relief.   The history is provided by the patient. No language interpreter was used.  Back Pain   This is a new problem. The current episode started more than 2 days ago. The problem occurs constantly. The problem has been gradually worsening. The pain is associated with no known injury. The pain is present in the lumbar spine. The quality of the pain is described as shooting. The pain radiates to the left thigh. The pain is severe. The symptoms are aggravated by bending, twisting and certain positions. Associated symptoms include leg pain. Pertinent negatives include no chest pain, no fever, no abdominal pain, no dysuria and no weakness.    Past Medical History:  Diagnosis Date  . Coronary artery disease   . Essential hypertension   . Gout   . 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 Pneumothorax    Associated with pneumonia    Patient Active Problem List   Diagnosis Date Noted  . NSTEMI (non-ST elevated myocardial infarction) (HCC) 12/06/2014  . CAD, multiple vessel 12/06/2014  . Dyslipidemia 12/06/2014  . Essential hypertension 12/06/2014  . Elevated troponin 12/04/2014  . ACS (acute coronary syndrome) (HCC) 12/04/2014  . History of inguinal hernia repair, 09/09/2012 09/28/2012    Past Surgical History:  Procedure Laterality Date  . Arm surgery    . CARDIAC CATHETERIZATION N/A 12/05/2014   Procedure: Left Heart Cath and Coronary Angiography;  Surgeon: Lyn Records, MD;  Location: [facility] INVASIVE CV LAB;  Service: Cardiovascular;  Laterality: N/A;  . CATARACT EXTRACTION W/PHACO Right  01/14/2013   Procedure: RIGHT EYE CATARACT EXTRACTION PHACO AND INTRAOCULAR LENS PLACEMENT ;  Surgeon: Gemma Payor, MD;  Location: [facility] ORS;  Service: Ophthalmology;  Laterality: Right;  CDE 54.97  . CATARACT EXTRACTION W/PHACO Left 02/18/2013   Procedure: CATARACT EXTRACTION PHACO AND INTRAOCULAR LENS PLACEMENT (IOC);  Surgeon: Gemma Payor, MD;  Location: [facility] ORS;  Service: Ophthalmology;  Laterality: Left;  CDE 34.02  . INGUINAL HERNIA REPAIR Right 09/09/2012   Procedure: HERNIA REPAIR INGUINAL INCARCERATED;  Surgeon: Kandis Cocking, MD;  Location: [facility] ORS;  Service: General;  Laterality: Right;  . INSERTION OF MESH Right 09/09/2012   Procedure: INSERTION OF MESH;  Surgeon: Kandis Cocking, MD;  Location: [facility] ORS;  Service: General;  Laterality: Right;       Home Medications    Prior to Admission medications   Medication Sig Start Date End Date Taking? Authorizing Provider  acetaminophen (TYLENOL) 500 MG tablet Take 1,000 mg by mouth every 6 (six) hours as needed for mild pain.   Yes Historical Provider, MD  amLODipine (NORVASC) 10 MG tablet Take 10 mg by mouth daily.   Yes Historical Provider, MD  aspirin 81 MG chewable tablet Chew 1 tablet (81 mg total) by mouth daily. 12/06/14  Yes Luke K Kilroy, PA-C  atorvastatin (LIPITOR) 40 MG tablet Take 40 mg by mouth daily.   Yes Historical Provider, MD  budesonide-formoterol (SYMBICORT) 160-4.5 MCG/ACT inhaler Inhale 2 puffs into the lungs 2 (two) times daily.   Yes  Historical Provider, MD  clopidogrel (PLAVIX) 75 MG tablet TAKE ONE TABLET BY MOUTH ONCE DAILY   **NEEDS OFFICE VISIT** 03/15/16  Yes Luke K Kilroy, PA-C  fluticasone (FLONASE) 50 MCG/ACT nasal spray Place 1 spray into both nostrils daily as needed for allergies.  11/18/14  Yes Historical Provider, MD  isosorbide mononitrate (IMDUR) 30 MG 24 hr tablet Take 1 tablet (30 mg total) by mouth daily. 12/06/14  Yes Luke K Kilroy, PA-C  lisinopril (PRINIVIL,ZESTRIL) 40 MG tablet Take 40 mg by mouth  daily.   Yes Historical Provider, MD  meloxicam (MOBIC) 7.5 MG tablet Take 7.5 mg by mouth 2 (two) times daily.   Yes Historical Provider, MD  metoprolol tartrate (LOPRESSOR) 25 MG tablet TAKE ONE-HALF TABLET BY MOUTH TWICE DAILY 11/26/15  Yes Jodelle GrossKathryn M Lawrence, NP  nitroGLYCERIN (NITROSTAT) 0.4 MG SL tablet Place 1 tablet (0.4 mg total) under the tongue every 5 (five) minutes x 3 doses as needed for chest pain. 12/06/14  Yes Luke K Kilroy, PA-C  traMADol (ULTRAM) 50 MG tablet Take 1 tablet (50 mg total) by mouth every 12 (twelve) hours as needed for moderate pain. 03/07/15  Yes Linwood DibblesJon Knapp, MD  cyclobenzaprine (FLEXERIL) 5 MG tablet Take 1 tablet (5 mg total) by mouth 3 (three) times daily as needed for muscle spasms. 04/17/16   Jaeson Molstad Orlene OchM Sharmain Lastra, NP    Family History Family History  Problem Relation Age of Onset  . COPD Father   . Stroke Brother     Social History Social History  Substance Use Topics  . Smoking status: Former Smoker    Types: Cigarettes    Quit date: 01/28/1978  . Smokeless tobacco: Never Used  . Alcohol use No     Allergies   Patient has no known allergies.   Review of Systems Review of Systems  Constitutional: Negative for chills and fever.  HENT: Negative for congestion and sore throat.   Respiratory: Negative for cough and shortness of breath.   Cardiovascular: Negative for chest pain.  Gastrointestinal: Negative for abdominal pain, nausea and vomiting.  Genitourinary: Negative for dysuria, flank pain and urgency.  Musculoskeletal: Positive for back pain.  Skin: Negative for rash.  Neurological: Negative for weakness.  Psychiatric/Behavioral: Negative for confusion.     Physical Exam Updated Vital Signs BP 118/74   Pulse (!) 56   Temp 98.2 F (36.8 C)   Resp 16   Ht 5\' 4"  (1.626 m)   Wt 103 kg   SpO2 97%   BMI 38.96 kg/m   Physical Exam  Constitutional: He is oriented to person, place, and time. He appears well-developed and well-nourished. No  distress.  HENT:  Head: Normocephalic and atraumatic.  Eyes: EOM are normal.  Neck: Normal range of motion. Neck supple.  Cardiovascular: Normal rate and regular rhythm.   Pulmonary/Chest: Effort normal and breath sounds normal.  Abdominal: Soft. Bowel sounds are normal. There is no tenderness.  Musculoskeletal:       Lumbar back: He exhibits tenderness and spasm. He exhibits no deformity and normal pulse. Decreased range of motion: due to pain.  Tender with palpation and range of motion of the left lower lumbar area that radiates to the left sciatic nerve. DP pulses 2+.   Neurological: He is alert and oriented to person, place, and time. He has normal strength. No sensory deficit. Gait normal.  Reflex Scores:      Patellar reflexes are 2+ on the right side. Ambulatory with steady gait  Skin:  Skin is warm and dry.  Psychiatric: He has a normal mood and affect. His behavior is normal.  Nursing note and vitals reviewed.    ED Treatments / Results  Labs (all labs ordered are listed, but only abnormal results are displayed) Labs Reviewed - No data to display  Radiology Dg Lumbar Spine Complete  Result Date: 04/17/2016 CLINICAL DATA:  71 year old male with 5 days of lumbar back pain, more so on the left and radiating to the hip. No known injury. EXAM: LUMBAR SPINE - COMPLETE 4+ VIEW COMPARISON:  CT Abdomen and Pelvis 03/07/2015. Lumbar radiographs 10/03/2014. Thoracic spine radiographs 10/03/2014. FINDINGS: There are 12 full size rib pairs demonstrated on the 2016 thoracic study. 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 This is a different numbering system than on the 2016 lumbar radiographs. Correlation with radiographs is recommended prior to any operative intervention. Chronic S1 pars fractures. Trace anterolisthesis of S1 on S2 is stable since 2017. Chronic L5-S1 disc space loss and vacuum disc. Stable vertebral height and alignment. Mild  L3-L4 and L4-L5 disc space loss and endplate spurring. No other pars fracture. Visible lower thoracic levels appear intact. Sacral ala and SI joints are within normal limits. Calcified aortic atherosclerosis. IMPRESSION: 1. 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 Correlation with radiographs is recommended prior to any operative intervention. 2.  No acute osseous abnormality. 3. Chronic S1 pars fractures. Stable trace anterolisthesis of S1 on S2. Advanced chronic L5-S1 disc degeneration. Mild lumbar disc and endplate degeneration elsewhere. 4.  Calcified aortic atherosclerosis. Electronically Signed   By: Odessa Fleming M.D.   On: 04/17/2016 15:01    Procedures Procedures (including critical care time)  Medications Ordered in ED Medications  cyclobenzaprine (FLEXERIL) tablet 10 mg (10 mg Oral Given 04/17/16 1313)  oxyCODONE-acetaminophen (PERCOCET/ROXICET) 5-325 MG per tablet 1 tablet (1 tablet Oral Given 04/17/16 1313)     Initial Impression / Assessment and Plan / ED Course  I have reviewed the triage vital signs and the nursing notes.  Pertinent imaging results that were available during my care of the patient were reviewed by me and considered in my medical decision making (see chart for details).   Final Clinical Impressions(s) / ED Diagnoses  71 y.o. male with hx of chronic low back pain here today with acute exacerbation stable for d/c without neuro deficits, no loss of control of bladder or bowels. Will treat for muscle spasm and patient to f/u with his PCP. Pain managed here in the ED.   Dr. Juleen China in to see the patient and discuss plan of care.  Final diagnoses:  Acute exacerbation of chronic low back pain    New Prescriptions Discharge Medication List as of 04/17/2016  3:56 PM    START taking these medications   Details  cyclobenzaprine (FLEXERIL) 5 MG tablet Take 1 tablet (5 mg total) by mouth 3 (three) times daily as needed for muscle spasms., Starting Sun  04/17/2016, Print         [place], NP 04/17/16 1715    Raeford Razor, MD 04/17/16 2004

## 2016-04-17 NOTE — ED Triage Notes (Signed)
Patient c/o low back pain/ left hip pain that radiates into left leg. Denies any injury. Per patient took tylenol at 10am with no relief. Denies any complications with B/M or urination. Pedal pulse present, no swelling in left leg noted.

## 2016-04-17 NOTE — ED Notes (Addendum)
Pt reports hx of DDD with complaint of left side lower back pain that started 4-5 days ago. Pt denies any recent injury. Pt reports pain radiated down left leg to foot. Pt able to bear weight but decreased due to pain. Pt taking tramadol and tylenol with no relief.

## 2016-07-13 ENCOUNTER — Ambulatory Visit: Payer: Medicare Other | Admitting: Anesthesiology

## 2016-07-13 ENCOUNTER — Encounter
Admission: RE | Disposition: A | Discharge status: Home / Self Care | Payer: Self-pay | Source: Ambulatory Visit | Attending: Unknown Physician Specialty

## 2016-07-13 ENCOUNTER — Ambulatory Visit
Admission: RE | Admit: 2016-07-13 | Discharge: 2016-07-13 | Disposition: A | Discharge status: Home / Self Care | Payer: Medicare Other | Source: Ambulatory Visit | Attending: Unknown Physician Specialty | Admitting: Unknown Physician Specialty

## 2016-07-13 DIAGNOSIS — J449 Chronic obstructive pulmonary disease, unspecified: Secondary | ICD-10-CM | POA: Insufficient documentation

## 2016-07-13 DIAGNOSIS — I2510 Atherosclerotic heart disease of native coronary artery without angina pectoris: Secondary | ICD-10-CM | POA: Insufficient documentation

## 2016-07-13 DIAGNOSIS — I252 Old myocardial infarction: Secondary | ICD-10-CM | POA: Diagnosis not present

## 2016-07-13 DIAGNOSIS — K635 Polyp of colon: Secondary | ICD-10-CM | POA: Diagnosis not present

## 2016-07-13 DIAGNOSIS — Z1211 Encounter for screening for malignant neoplasm of colon: Secondary | ICD-10-CM | POA: Diagnosis not present

## 2016-07-13 DIAGNOSIS — K5730 Diverticulosis of large intestine without perforation or abscess without bleeding: Secondary | ICD-10-CM | POA: Insufficient documentation

## 2016-07-13 DIAGNOSIS — Z87891 Personal history of nicotine dependence: Secondary | ICD-10-CM | POA: Insufficient documentation

## 2016-07-13 DIAGNOSIS — I10 Essential (primary) hypertension: Secondary | ICD-10-CM | POA: Insufficient documentation

## 2016-07-13 DIAGNOSIS — K648 Other hemorrhoids: Secondary | ICD-10-CM | POA: Diagnosis not present

## 2016-07-13 DIAGNOSIS — Z6836 Body mass index (BMI) 36.0-36.9, adult: Secondary | ICD-10-CM | POA: Diagnosis not present

## 2016-07-13 DIAGNOSIS — Z79899 Other long term (current) drug therapy: Secondary | ICD-10-CM | POA: Insufficient documentation

## 2016-07-13 DIAGNOSIS — M109 Gout, unspecified: Secondary | ICD-10-CM | POA: Insufficient documentation

## 2016-07-13 DIAGNOSIS — E669 Obesity, unspecified: Secondary | ICD-10-CM | POA: Diagnosis not present

## 2016-07-13 DIAGNOSIS — Z7902 Long term (current) use of antithrombotics/antiplatelets: Secondary | ICD-10-CM | POA: Insufficient documentation

## 2016-07-13 HISTORY — PX: COLONOSCOPY WITH PROPOFOL: SHX5780

## 2016-07-13 SURGERY — COLONOSCOPY WITH PROPOFOL
Anesthesia: General

## 2016-07-13 MED ORDER — PROPOFOL 500 MG/50ML IV EMUL
INTRAVENOUS | Status: AC
  Filled 2016-07-13: qty 50

## 2016-07-13 MED ORDER — SODIUM CHLORIDE 0.9 % IV SOLN: INTRAVENOUS | Status: DC

## 2016-07-13 MED ORDER — MIDAZOLAM HCL 5 MG/5ML IJ SOLN
INTRAMUSCULAR | Status: DC | PRN
  Administered 2016-07-13: 2 mg via INTRAVENOUS

## 2016-07-13 MED ORDER — LIDOCAINE HCL (PF) 2 % IJ SOLN
INTRAMUSCULAR | Status: AC
  Filled 2016-07-13: qty 2

## 2016-07-13 MED ORDER — EPHEDRINE SULFATE 50 MG/ML IJ SOLN
INTRAMUSCULAR | Status: DC | PRN
  Administered 2016-07-13: 10 mg via INTRAVENOUS
  Administered 2016-07-13: 15 mg via INTRAVENOUS

## 2016-07-13 MED ORDER — SODIUM CHLORIDE 0.9 % IV SOLN
INTRAVENOUS | Status: DC
  Administered 2016-07-13: 1000 mL via INTRAVENOUS

## 2016-07-13 MED ORDER — FENTANYL CITRATE (PF) 100 MCG/2ML IJ SOLN
INTRAMUSCULAR | Status: AC
  Filled 2016-07-13: qty 2

## 2016-07-13 MED ORDER — EPHEDRINE SULFATE 50 MG/ML IJ SOLN
INTRAMUSCULAR | Status: AC
  Filled 2016-07-13: qty 1

## 2016-07-13 MED ORDER — PROPOFOL 500 MG/50ML IV EMUL
INTRAVENOUS | Status: DC | PRN
  Administered 2016-07-13: 50 ug/kg/min via INTRAVENOUS

## 2016-07-13 MED ORDER — PROPOFOL 10 MG/ML IV BOLUS
INTRAVENOUS | Status: DC | PRN
  Administered 2016-07-13: 20 mg via INTRAVENOUS
  Administered 2016-07-13: 30 mg via INTRAVENOUS

## 2016-07-13 MED ORDER — PHENYLEPHRINE HCL 10 MG/ML IJ SOLN
INTRAMUSCULAR | Status: AC
  Filled 2016-07-13: qty 1

## 2016-07-13 MED ORDER — LIDOCAINE HCL (PF) 2 % IJ SOLN
INTRAMUSCULAR | Status: DC | PRN
  Administered 2016-07-13: 50 mg

## 2016-07-13 MED ORDER — PHENYLEPHRINE HCL 10 MG/ML IJ SOLN
INTRAMUSCULAR | Status: DC | PRN
  Administered 2016-07-13 (×2): 200 ug via INTRAVENOUS
  Administered 2016-07-13 (×2): 100 ug via INTRAVENOUS
  Administered 2016-07-13: 200 ug via INTRAVENOUS

## 2016-07-13 MED ORDER — MIDAZOLAM HCL 2 MG/2ML IJ SOLN
INTRAMUSCULAR | Status: AC
  Filled 2016-07-13: qty 2

## 2016-07-13 MED ORDER — FENTANYL CITRATE (PF) 100 MCG/2ML IJ SOLN
INTRAMUSCULAR | Status: DC | PRN
  Administered 2016-07-13 (×2): 50 ug via INTRAVENOUS

## 2016-07-13 NOTE — Op Note (Signed)
[facility] Gastroenterology Patient Name: Gregory [name] Procedure Date: 07/13/2016 7:13 AM MRN: [id number] Account #: [id number] Date of Birth: 10-25-1945 Admit Type: Outpatient Age: 71 Room: [name] ENDO ROOM 3 Gender: Male Note Status: Finalized Procedure:            Colonoscopy Indications:          Screening for colorectal malignant neoplasm Providers:            Scot Jun, MD Referring MD:         Silas Flood. Ellsworth Lennox, MD (Referring MD) Medicines:            Propofol per Anesthesia Complications:        No immediate complications. Procedure:            Pre-Anesthesia Assessment:                       - After reviewing the risks and benefits, the patient                        was deemed in satisfactory condition to undergo the                        procedure.                       After obtaining informed consent, the colonoscope was                        passed under direct vision. Throughout the procedure,                        the patient's blood pressure, pulse, and oxygen                        saturations were monitored continuously. The                        Colonoscope was introduced through the anus and                        advanced to the the cecum, identified by appendiceal                        orifice and ileocecal valve. The colonoscopy was                        performed without difficulty. The patient tolerated the                        procedure well. The quality of the bowel preparation                        was good. Findings:      A small polyp was found in the cecum. The polyp was sessile. The polyp       was removed with a jumbo cold forceps. Resection and retrieval were       complete.      Many small-mouthed diverticula were found in the sigmoid colon and       descending colon.      Internal hemorrhoids were found during endoscopy. The hemorrhoids were  small and Grade I (internal hemorrhoids that do not  prolapse).      The exam was otherwise without abnormality. Impression:           - One small polyp in the cecum, removed with a jumbo                        cold forceps. Resected and retrieved.                       - Diverticulosis in the sigmoid colon and in the                        descending colon.                       - Internal hemorrhoids.                       - The examination was otherwise normal. Recommendation:       - Await pathology results. Scot Junobert T Elliott, MD 07/13/2016 7:55:29 AM This report has been signed electronically. Number of Addenda: 0 Note Initiated On: 07/13/2016 7:13 AM Scope Withdrawal Time: 0 hours 12 minutes 53 seconds  Total Procedure Duration: 0 hours 16 minutes 56 seconds       [facility]

## 2016-07-13 NOTE — Anesthesia Postprocedure Evaluation (Signed)
Anesthesia Post Note  Patient: Arneta ClicheRichard W Laningham  Procedure(s) Performed: Procedure(s) (LRB): COLONOSCOPY WITH PROPOFOL (N/A)  Patient location during evaluation: PACU Anesthesia Type: General Level of consciousness: awake Pain management: pain level controlled Vital Signs Assessment: post-procedure vital signs reviewed and stable Respiratory status: spontaneous breathing Cardiovascular status: stable Anesthetic complications: no     Last Vitals:  Vitals:   07/13/16 0816 07/13/16 0826  BP: (!) 101/58 (!) 103/59  Pulse: 66 66  Resp: (!) 26 14  Temp:      Last Pain:  Vitals:   07/13/16 0756  TempSrc: Tympanic                 VAN STAVEREN,Loraine Freid

## 2016-07-13 NOTE — Transfer of Care (Signed)
Immediate Anesthesia Transfer of Care Note  Patient: Gregory ClicheRichard W [name]  Procedure(s) Performed: Procedure(s): COLONOSCOPY WITH PROPOFOL (N/A)  Patient Location: PACU  Anesthesia Type:General  Level of Consciousness: sedated  Airway & Oxygen Therapy: Patient Spontanous Breathing and Patient connected to nasal cannula oxygen  Post-op Assessment: Report given to RN and Post -op Vital signs reviewed and stable  Post vital signs: Reviewed and stable  Last Vitals:  Vitals:   07/13/16 0756 07/13/16 0800  BP: (!) 96/41 (!) 99/54  Pulse: 68 70  Resp: 15 17  Temp: 36.2 C     Last Pain:  Vitals:   07/13/16 0756  TempSrc: Tympanic         Complications: No apparent anesthesia complications

## 2016-07-13 NOTE — Anesthesia Preprocedure Evaluation (Signed)
Anesthesia Evaluation  Patient identified by MRN, date of birth, ID band Patient awake    Reviewed: Allergy & Precautions  Airway Mallampati: III       Dental  (+) Upper Dentures, Lower Dentures   Pulmonary COPD,  COPD inhaler, former smoker,     + decreased breath sounds      Cardiovascular Exercise Tolerance: Good hypertension, Pt. on medications and Pt. on home beta blockers + CAD and + Past MI   Rhythm:Regular     Neuro/Psych    GI/Hepatic negative GI ROS, Neg liver ROS,   Endo/Other  negative endocrine ROS  Renal/GU negative Renal ROS     Musculoskeletal   Abdominal (+) + obese,   Peds negative pediatric ROS (+)  Hematology negative hematology ROS (+)   Anesthesia Other Findings   Reproductive/Obstetrics                             Anesthesia Physical Anesthesia Plan  ASA: III  Anesthesia Plan: General   Post-op Pain Management:    Induction: Intravenous  PONV Risk Score and Plan: 0  Airway Management Planned: Natural Airway and Nasal Cannula  Additional Equipment:   Intra-op Plan:   Post-operative Plan:   Informed Consent: I have reviewed the patients History and Physical, chart, labs and discussed the procedure including the risks, benefits and alternatives for the proposed anesthesia with the patient or authorized representative who has indicated his/her understanding and acceptance.     Plan Discussed with: CRNA  Anesthesia Plan Comments:         Anesthesia Quick Evaluation

## 2016-07-13 NOTE — Anesthesia Post-op Follow-up Note (Cosign Needed)
Anesthesia QCDR form completed.        

## 2016-07-13 NOTE — H&P (Signed)
Primary Care Physician:  Sherron Monday, MD Primary Gastroenterologist:  Dr. Mechele Collin  Pre-Procedure History & Physical: HPI:  Gregory [name] is a 71 y.o. male is here for an colonoscopy.   Past Medical History:  Diagnosis Date  . Coronary artery disease   . Essential hypertension   . Gout   . 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 Pneumothorax    Associated with pneumonia    Past Surgical History:  Procedure Laterality Date  . Arm surgery    . CARDIAC CATHETERIZATION N/A 12/05/2014   Procedure: Left Heart Cath and Coronary Angiography;  Surgeon: Lyn Records, MD;  Location: [facility] INVASIVE CV LAB;  Service: Cardiovascular;  Laterality: N/A;  . CATARACT EXTRACTION W/PHACO Right 01/14/2013   Procedure: RIGHT EYE CATARACT EXTRACTION PHACO AND INTRAOCULAR LENS PLACEMENT ;  Surgeon: Gemma Payor, MD;  Location: [facility] ORS;  Service: Ophthalmology;  Laterality: Right;  CDE 54.97  . CATARACT EXTRACTION W/PHACO Left 02/18/2013   Procedure: CATARACT EXTRACTION PHACO AND INTRAOCULAR LENS PLACEMENT (IOC);  Surgeon: Gemma Payor, MD;  Location: [facility] ORS;  Service: Ophthalmology;  Laterality: Left;  CDE 34.02  . INGUINAL HERNIA REPAIR Right 09/09/2012   Procedure: HERNIA REPAIR INGUINAL INCARCERATED;  Surgeon: Kandis Cocking, MD;  Location: [facility] ORS;  Service: General;  Laterality: Right;  . INSERTION OF MESH Right 09/09/2012   Procedure: INSERTION OF MESH;  Surgeon: Kandis Cocking, MD;  Location: [facility] ORS;  Service: General;  Laterality: Right;    Prior to Admission medications   Medication Sig Start Date End Date Taking? Authorizing Provider  acetaminophen (TYLENOL) 500 MG tablet Take 1,000 mg by mouth every 6 (six) hours as needed for mild pain.   Yes [provider]  allopurinol (ZYLOPRIM) 300 MG tablet Take 300 mg by mouth daily.   Yes [provider]  aspirin 81 MG chewable tablet Chew 1 tablet (81 mg total) by mouth daily. 12/06/14  Yes Kilroy, Luke K, PA-C  benzonatate (TESSALON)  100 MG capsule Take 100 mg by mouth 3 (three) times daily as needed for cough.   Yes [provider]  Cholecalciferol 1000 units tablet Take 1,000 Units by mouth daily.   Yes [provider]  clopidogrel (PLAVIX) 75 MG tablet TAKE ONE TABLET BY MOUTH ONCE DAILY   **NEEDS OFFICE VISIT** 03/15/16  Yes Kilroy, Luke K, PA-C  Fluticasone Propionate, Inhal, (FLOVENT DISKUS) 100 MCG/BLIST AEPB Inhale into the lungs 2 (two) times daily.   Yes [provider]  furosemide (LASIX) 20 MG tablet Take 20 mg by mouth daily.   Yes [provider]  traZODone (DESYREL) 50 MG tablet Take 50 mg by mouth at bedtime.   Yes [provider]  amLODipine (NORVASC) 10 MG tablet Take 10 mg by mouth daily.    [provider]  atorvastatin (LIPITOR) 20 MG tablet Take 20 mg by mouth daily.     [provider]  budesonide-formoterol (SYMBICORT) 160-4.5 MCG/ACT inhaler Inhale 2 puffs into the lungs 2 (two) times daily.    [provider]  cyclobenzaprine (FLEXERIL) 5 MG tablet Take 1 tablet (5 mg total) by mouth 3 (three) times daily as needed for muscle spasms. 04/17/16   Janne Napoleon, NP  fluticasone (FLONASE) 50 MCG/ACT nasal spray Place 1 spray into both nostrils daily as needed for allergies.  11/18/14   [provider]  isosorbide mononitrate (IMDUR) 30 MG 24 hr tablet Take 1 tablet (30 mg total) by mouth daily. 12/06/14  Kilroy, Luke K, PA-C  lisinopril (PRINIVIL,ZESTRIL) 40 MG tablet Take 40 mg by mouth daily.    [provider]  meloxicam (MOBIC) 7.5 MG tablet Take 7.5 mg by mouth 2 (two) times daily.    [provider]  metoprolol tartrate (LOPRESSOR) 25 MG tablet TAKE ONE-HALF TABLET BY MOUTH TWICE DAILY 11/26/15   Jodelle GrossLawrence, Kathryn M, NP  nitroGLYCERIN (NITROSTAT) 0.4 MG SL tablet Place 1 tablet (0.4 mg total) under the tongue every 5 (five) minutes x 3 doses as needed for chest pain. 12/06/14   Abelino DerrickKilroy, Luke K, PA-C   traMADol (ULTRAM) 50 MG tablet Take 1 tablet (50 mg total) by mouth every 12 (twelve) hours as needed for moderate pain. Patient taking differently: Take 50 mg by mouth every 6 (six) hours as needed for moderate pain.  03/07/15   Linwood DibblesKnapp, Jon, MD    Allergies as of 05/12/2016  . (No Known Allergies)    Family History  Problem Relation Age of Onset  . COPD Father   . Stroke Brother     Social History   Social History  . Marital status: Married    Spouse name: N/A  . Number of children: N/A  . Years of education: N/A   Occupational History  . Not on file.   Social History Main Topics  . Smoking status: Former Smoker    Types: Cigarettes    Quit date: 01/28/1978  . Smokeless tobacco: Never Used  . Alcohol use No  . Drug use: No  . Sexual activity: Not on file   Other Topics Concern  . Not on file   Social History Narrative  . No narrative on file    Review of Systems: See HPI, otherwise negative ROS  Physical Exam: BP 132/75   Pulse 64   Temp 97.8 F (36.6 C) (Tympanic)   Resp 16   Ht 5\' 4"  (1.626 m)   Wt 95.3 kg (210 lb)   SpO2 97%   BMI 36.05 kg/m  General:   Alert,  pleasant and cooperative in NAD Head:  Normocephalic and atraumatic. Neck:  Supple; no masses or thyromegaly. Lungs:  Clear throughout to auscultation.    Heart:  Regular rate and rhythm. Abdomen:  Soft, nontender and nondistended. Normal bowel sounds, without guarding, and without rebound.   Neurologic:  Alert and  oriented x4;  grossly normal neurologically.  Impression/Plan: Gregory [name] is here for an colonoscopy to be performed for colon cancer screening  Risks, benefits, limitations, and alternatives regarding  colonoscopy have been reviewed with the patient.  Questions have been answered.  All parties agreeable.   Lynnae PrudeELLIOTT, Sultana Tierney, MD  07/13/2016, 7:25 AM

## 2016-07-14 ENCOUNTER — Encounter: Payer: Self-pay | Admitting: Unknown Physician Specialty

## 2016-07-25 LAB — SURGICAL PATHOLOGY

## 2016-09-09 IMAGING — CR DG CHEST 1V PORT
1 series · 1 of 1 positions shown · non-contrast
Comparison: 10/03/2014

CLINICAL DATA: Right-sided chest pain radiating into the right arm
for 2 hours

EXAM:
PORTABLE CHEST - 1 VIEW

[ap portable]
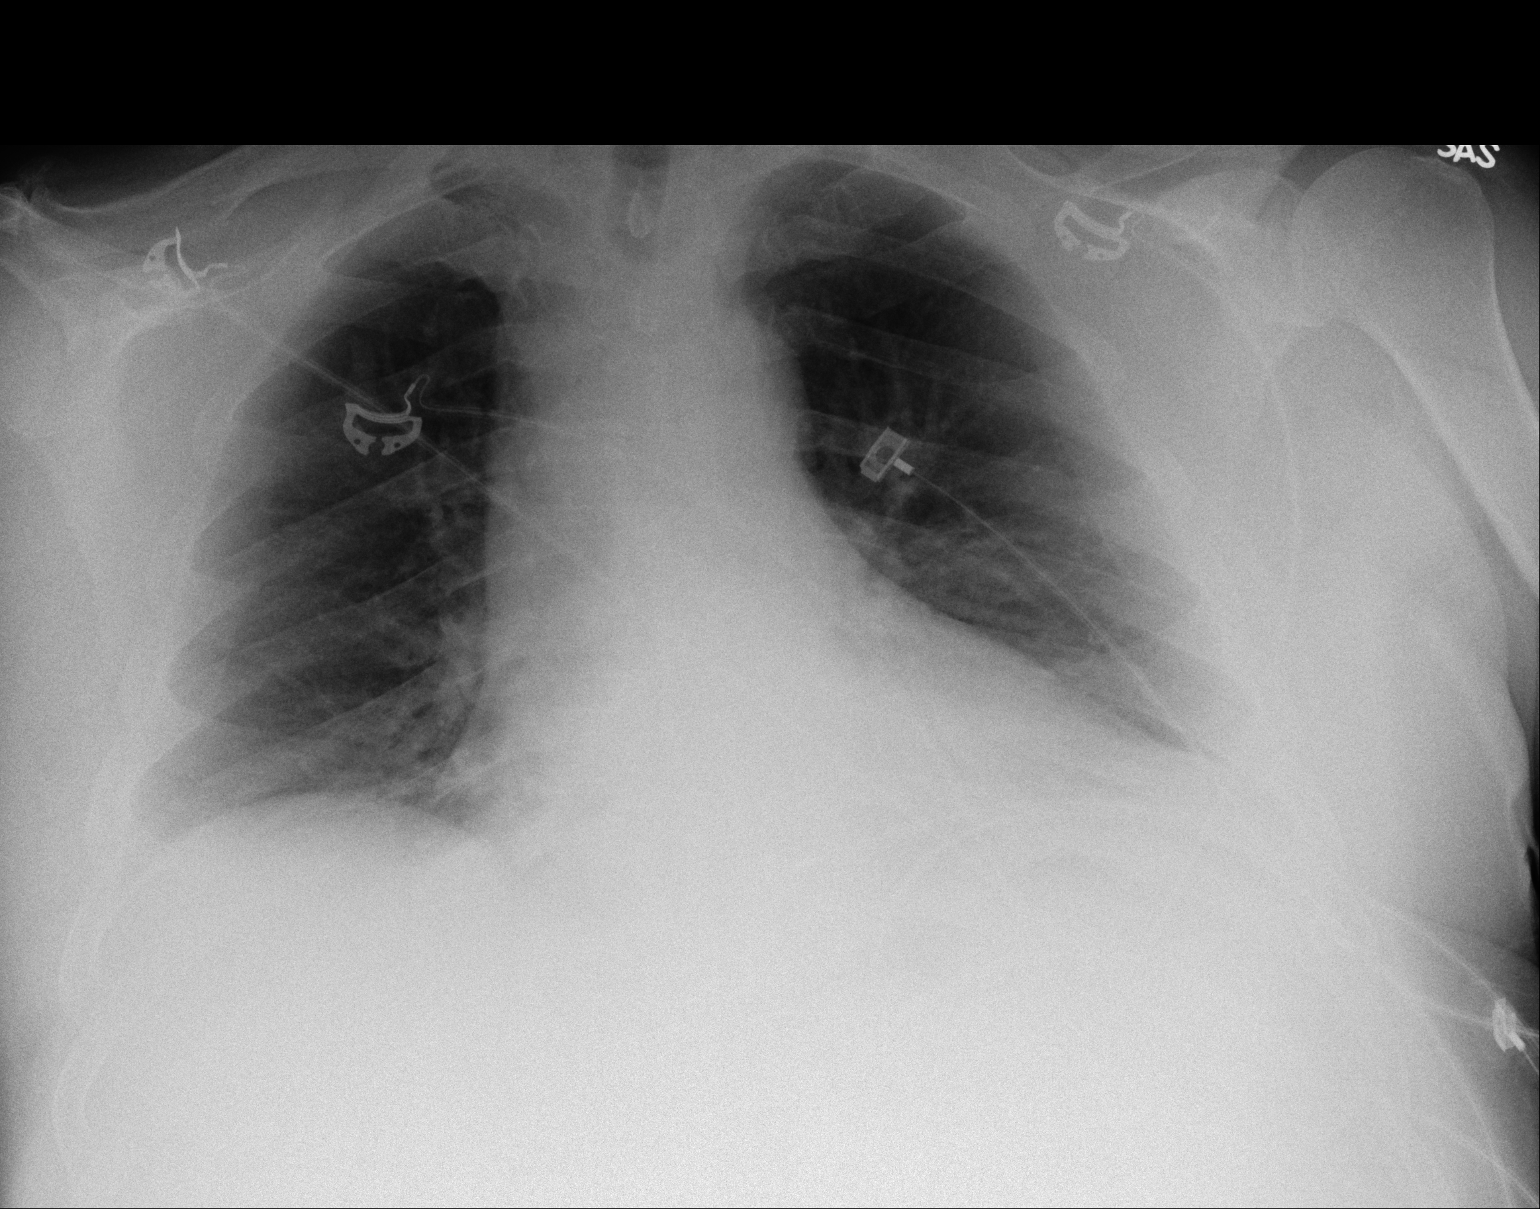

[1 of 1 positions shown; findings below may reference images not displayed]

FINDINGS: The heart size and mediastinal contours are within normal limits.
Both lungs are clear. The visualized skeletal structures are
unremarkable.
IMPRESSION: No active disease.

## 2016-10-20 ENCOUNTER — Emergency Department (HOSPITAL_COMMUNITY)
Admission: EM | Admit: 2016-10-20 | Discharge: 2016-10-20 | Disposition: A | Discharge status: Home / Self Care | Payer: Medicare Other | Attending: Emergency Medicine | Admitting: Emergency Medicine

## 2016-10-20 ENCOUNTER — Encounter (HOSPITAL_COMMUNITY): Payer: Self-pay | Admitting: *Deleted

## 2016-10-20 DIAGNOSIS — I259 Chronic ischemic heart disease, unspecified: Secondary | ICD-10-CM | POA: Insufficient documentation

## 2016-10-20 DIAGNOSIS — Z7902 Long term (current) use of antithrombotics/antiplatelets: Secondary | ICD-10-CM | POA: Diagnosis not present

## 2016-10-20 DIAGNOSIS — I252 Old myocardial infarction: Secondary | ICD-10-CM | POA: Insufficient documentation

## 2016-10-20 DIAGNOSIS — Z87891 Personal history of nicotine dependence: Secondary | ICD-10-CM | POA: Diagnosis not present

## 2016-10-20 DIAGNOSIS — Z7982 Long term (current) use of aspirin: Secondary | ICD-10-CM | POA: Diagnosis not present

## 2016-10-20 DIAGNOSIS — S00412A Abrasion of left ear, initial encounter: Secondary | ICD-10-CM | POA: Insufficient documentation

## 2016-10-20 DIAGNOSIS — Y999 Unspecified external cause status: Secondary | ICD-10-CM | POA: Diagnosis not present

## 2016-10-20 DIAGNOSIS — I10 Essential (primary) hypertension: Secondary | ICD-10-CM | POA: Insufficient documentation

## 2016-10-20 DIAGNOSIS — Y929 Unspecified place or not applicable: Secondary | ICD-10-CM | POA: Diagnosis not present

## 2016-10-20 DIAGNOSIS — S01302A Unspecified open wound of left ear, initial encounter: Secondary | ICD-10-CM | POA: Diagnosis present

## 2016-10-20 DIAGNOSIS — X58XXXA Exposure to other specified factors, initial encounter: Secondary | ICD-10-CM | POA: Insufficient documentation

## 2016-10-20 DIAGNOSIS — Y9389 Activity, other specified: Secondary | ICD-10-CM | POA: Insufficient documentation

## 2016-10-20 DIAGNOSIS — H61892 Other specified disorders of left external ear: Secondary | ICD-10-CM

## 2016-10-20 LAB — BASIC METABOLIC PANEL
Anion gap: 8 (ref 5–15)
BUN: 16 mg/dL (ref 6–20)
CO2: 25 mmol/L (ref 22–32)
Calcium: 9.4 mg/dL (ref 8.9–10.3)
Chloride: 104 mmol/L (ref 101–111)
Creatinine, Ser: 1.37 mg/dL — ABNORMAL HIGH (ref 0.61–1.24)
GFR calc Af Amer: 59 mL/min — ABNORMAL LOW (ref >60)
GFR calc non Af Amer: 51 mL/min — ABNORMAL LOW (ref >60)
Glucose, Bld: 105 mg/dL — ABNORMAL HIGH (ref 65–99)
Potassium: 4 mmol/L (ref 3.5–5.1)
Sodium: 137 mmol/L (ref 135–145)

## 2016-10-20 LAB — CBC
HCT: 49.7 % (ref 39.0–52.0)
Hemoglobin: 16.9 g/dL (ref 13.0–17.0)
MCH: 30.5 pg (ref 26.0–34.0)
MCHC: 34 g/dL (ref 30.0–36.0)
MCV: 89.5 fL (ref 78.0–100.0)
Platelets: 237 10*3/uL (ref 150–400)
RBC: 5.55 MIL/uL (ref 4.22–5.81)
RDW: 14.1 % (ref 11.5–15.5)
WBC: 12.9 10*3/uL — ABNORMAL HIGH (ref 4.0–10.5)

## 2016-10-20 LAB — PROTIME-INR
INR: 0.96
Prothrombin Time: 12.7 seconds (ref 11.4–15.2)

## 2016-10-20 LAB — APTT: aPTT: 34 seconds (ref 24–36)

## 2016-10-20 MED ORDER — LIDOCAINE-EPINEPHRINE-TETRACAINE (LET) SOLUTION
3.0000 mL | Freq: Once | NASAL | Status: AC
  Administered 2016-10-20: 22:00:00 3 mL via TOPICAL
  Filled 2016-10-20: qty 3

## 2016-10-20 NOTE — ED Triage Notes (Signed)
Pt reports bleeding from his left ear since this morning. Pt denies any other complaints but pt is on a blood thinner.

## 2016-10-20 NOTE — ED Provider Notes (Signed)
[facility]-EM[facility] DEPT Provider Note   CSN: [id number] Arrival date & time: 10/20/16  2010     History   Chief Complaint Chief Complaint  Patient presents with  . ear bleeding    HPI HARLIE RAGLE is a 71 y.o. male.  HPI   71yM with bleeding from L ext aud canal. Onset this am after using q-tip to clean ear. Not really painful, just feels irritated. Has continued to ooze blood since. On asa/plavix. No other acute complaints.   Past Medical History:  Diagnosis Date  . Coronary artery disease   . Essential hypertension   . Gout   . 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 Pneumothorax    Associated with pneumonia    Patient Active Problem List   Diagnosis Date Noted  . NSTEMI (non-ST elevated myocardial infarction) (HCC) 12/06/2014  . CAD, multiple vessel 12/06/2014  . Dyslipidemia 12/06/2014  . Essential hypertension 12/06/2014  . Elevated troponin 12/04/2014  . ACS (acute coronary syndrome) (HCC) 12/04/2014  . History of inguinal hernia repair, 09/09/2012 09/28/2012    Past Surgical History:  Procedure Laterality Date  . Arm surgery    . CARDIAC CATHETERIZATION N/A 12/05/2014   Procedure: Left Heart Cath and Coronary Angiography;  Surgeon: Lyn Records, MD;  Location: [facility] INVASIVE CV LAB;  Service: Cardiovascular;  Laterality: N/A;  . CATARACT EXTRACTION W/PHACO Right 01/14/2013   Procedure: RIGHT EYE CATARACT EXTRACTION PHACO AND INTRAOCULAR LENS PLACEMENT ;  Surgeon: Gemma Payor, MD;  Location: [facility] ORS;  Service: Ophthalmology;  Laterality: Right;  CDE 54.97  . CATARACT EXTRACTION W/PHACO Left 02/18/2013   Procedure: CATARACT EXTRACTION PHACO AND INTRAOCULAR LENS PLACEMENT (IOC);  Surgeon: Gemma Payor, MD;  Location: [facility] ORS;  Service: Ophthalmology;  Laterality: Left;  CDE 34.02  . COLONOSCOPY WITH PROPOFOL N/A 07/13/2016   Procedure: COLONOSCOPY WITH PROPOFOL;  Surgeon: Scot Jun, MD;  Location: [facility] ENDOSCOPY;  Service: Endoscopy;  Laterality: N/A;  . INGUINAL  HERNIA REPAIR Right 09/09/2012   Procedure: HERNIA REPAIR INGUINAL INCARCERATED;  Surgeon: Kandis Cocking, MD;  Location: [facility] ORS;  Service: General;  Laterality: Right;  . INSERTION OF MESH Right 09/09/2012   Procedure: INSERTION OF MESH;  Surgeon: Kandis Cocking, MD;  Location: [facility] ORS;  Service: General;  Laterality: Right;       Home Medications    Prior to Admission medications   Medication Sig Start Date End Date Taking? Authorizing Provider  aspirin 81 MG chewable tablet Chew 1 tablet (81 mg total) by mouth daily. 12/06/14  Yes Kilroy, Luke K, PA-C  budesonide-formoterol (SYMBICORT) 160-4.5 MCG/ACT inhaler Inhale 2 puffs into the lungs 2 (two) times daily.   Yes [provider]  Cholecalciferol 50000 units capsule Take 1,000 Units by mouth every 30 (thirty) days.    Yes [provider]  fluticasone (FLONASE) 50 MCG/ACT nasal spray Place 1 spray into both nostrils 2 (two) times daily.  11/18/14  Yes [provider]  nitroGLYCERIN (NITROSTAT) 0.4 MG SL tablet Place 1 tablet (0.4 mg total) under the tongue every 5 (five) minutes x 3 doses as needed for chest pain. 12/06/14  Yes Kilroy, Luke K, PA-C  traZODone (DESYREL) 50 MG tablet Take 50 mg by mouth at bedtime.   Yes [provider]  allopurinol (ZYLOPRIM) 300 MG tablet Take 300 mg by mouth daily.    [provider]  amLODipine (NORVASC) 10 MG tablet Take 10 mg by mouth daily.    [provider]  atorvastatin (LIPITOR) 20 MG tablet Take 20 mg by mouth daily.     [provider]  clopidogrel (PLAVIX) 75 MG tablet TAKE ONE TABLET BY MOUTH ONCE DAILY   **NEEDS OFFICE VISIT** 03/15/16   Abelino Derrick, PA-C  cyclobenzaprine (FLEXERIL) 5 MG tablet Take 1 tablet (5 mg total) by mouth 3 (three) times daily as needed for muscle spasms. 04/17/16   Janne Napoleon, NP  furosemide (LASIX) 20 MG tablet Take 20 mg by mouth daily.    [provider]  isosorbide mononitrate (IMDUR) 30 MG  24 hr tablet Take 1 tablet (30 mg total) by mouth daily. 12/06/14   Abelino Derrick, PA-C  lisinopril (PRINIVIL,ZESTRIL) 40 MG tablet Take 40 mg by mouth daily.    [provider]  meloxicam (MOBIC) 7.5 MG tablet Take 7.5 mg by mouth 2 (two) times daily.    [provider]  metoprolol tartrate (LOPRESSOR) 25 MG tablet TAKE ONE-HALF TABLET BY MOUTH TWICE DAILY 11/26/15   Jodelle Gross, NP    Family History Family History  Problem Relation Age of Onset  . COPD Father   . Stroke Brother     Social History Social History  Substance Use Topics  . Smoking status: Former Smoker    Types: Cigarettes    Quit date: 01/28/1978  . Smokeless tobacco: Never Used  . Alcohol use No     Allergies   Patient has no known allergies.   Review of Systems Review of Systems All systems reviewed and negative, other than as noted in HPI.   Physical Exam Updated Vital Signs BP 131/76 (BP Location: Right Arm)   Pulse 61   Temp 98.2 F (36.8 C) (Oral)   Resp 16   Ht  (1.626 m)   Wt 96.2 kg (212 lb)   SpO2 (!) 59%   BMI 36.39 kg/m   Physical Exam  Constitutional: He appears well-developed and well-nourished. No distress.  HENT:  Head: Normocephalic.  Mild bleeding from L ear from small excoriation at ~ 7 o'clock position. TM looks fine. No perf.   Eyes: Conjunctivae are normal. Right eye exhibits no discharge. Left eye exhibits no discharge.  Neck: Neck supple.  Cardiovascular: Normal rate, regular rhythm and normal heart sounds.  Exam reveals no gallop and no friction rub.   No murmur heard. Pulmonary/Chest: Effort normal and breath sounds normal. No respiratory distress.  Abdominal: Soft. He exhibits no distension. There is no tenderness.  Musculoskeletal: He exhibits no edema or tenderness.  Neurological: He is alert.  Skin: Skin is warm and dry.  Psychiatric: He has a normal mood and affect. His behavior is normal. Thought content normal.  Nursing note and  vitals reviewed.    ED Treatments / Results  Labs (all labs ordered are listed, but only abnormal results are displayed) Labs Reviewed  CBC - Abnormal; Notable for the following:       Result Value   WBC 12.9 (*)    All other components within normal limits  BASIC METABOLIC PANEL - Abnormal; Notable for the following:    Glucose, Bld 105 (*)    Creatinine, Ser 1.37 (*)    GFR calc non Af Amer 51 (*)    GFR calc Af Amer 59 (*)    All other components within normal limits  APTT  PROTIME-INR    EKG  EKG Interpretation None       Radiology No results found.  Procedures Procedures (including critical care  time)  Medications Ordered in ED Medications  lidocaine-EPINEPHrine-tetracaine (LET) solution (3 mLs Topical Given by Other 10/20/16 2203)     Initial Impression / Assessment and Plan / ED Course  I have reviewed the triage vital signs and the nursing notes.  Pertinent labs & imaging results that were available during my care of the patient were reviewed by me and considered in my medical decision making (see chart for details).     Mild bleeding L ext aud canal after using cotton swap. A little LET dripped on it and resolved. Advised not to stick anything into ext aud canals. It has been determined that no acute conditions requiring further emergency intervention are present at this time. The patient has been advised of the diagnosis and plan. I reviewed any labs and imaging including any potential incidental findings. We have discussed signs and symptoms that warrant return to the ED and they are listed in the discharge instructions.    Final Clinical Impressions(s) / ED Diagnoses   Final diagnoses:  Irritation of left external auditory canal    New Prescriptions New Prescriptions   No medications on file     Raeford Razor, MD 11/08/16 1146

## 2016-10-20 NOTE — Discharge Instructions (Signed)
You scratched your ear canal with the q-tip. Stop using them. Your bleeding has appeared to stop. If it restarts and persists then you need rechecked.

## 2016-12-12 ENCOUNTER — Other Ambulatory Visit: Payer: Self-pay | Admitting: Cardiology

## 2017-06-28 ENCOUNTER — Other Ambulatory Visit (INDEPENDENT_AMBULATORY_CARE_PROVIDER_SITE_OTHER): Payer: Self-pay

## 2018-11-26 ENCOUNTER — Other Ambulatory Visit: Payer: Self-pay

## 2018-11-26 DIAGNOSIS — Z20822 Contact with and (suspected) exposure to covid-19: Secondary | ICD-10-CM

## 2018-11-28 LAB — NOVEL CORONAVIRUS, NAA: SARS-CoV-2, NAA: NOT DETECTED

## 2020-07-08 DIAGNOSIS — E785 Hyperlipidemia, unspecified: Secondary | ICD-10-CM | POA: Diagnosis not present

## 2020-07-08 DIAGNOSIS — M109 Gout, unspecified: Secondary | ICD-10-CM | POA: Diagnosis not present

## 2020-07-10 DIAGNOSIS — E785 Hyperlipidemia, unspecified: Secondary | ICD-10-CM | POA: Diagnosis not present

## 2020-07-10 DIAGNOSIS — J449 Chronic obstructive pulmonary disease, unspecified: Secondary | ICD-10-CM | POA: Diagnosis not present

## 2020-07-10 DIAGNOSIS — M13831 Other specified arthritis, right wrist: Secondary | ICD-10-CM | POA: Diagnosis not present

## 2020-07-10 DIAGNOSIS — I2510 Atherosclerotic heart disease of native coronary artery without angina pectoris: Secondary | ICD-10-CM | POA: Diagnosis not present

## 2020-07-10 DIAGNOSIS — J301 Allergic rhinitis due to pollen: Secondary | ICD-10-CM | POA: Diagnosis not present

## 2020-07-10 DIAGNOSIS — G4709 Other insomnia: Secondary | ICD-10-CM | POA: Diagnosis not present

## 2020-07-10 DIAGNOSIS — R609 Edema, unspecified: Secondary | ICD-10-CM | POA: Diagnosis not present

## 2020-08-01 ENCOUNTER — Emergency Department (HOSPITAL_COMMUNITY): Payer: Medicare Other

## 2020-08-01 ENCOUNTER — Emergency Department (HOSPITAL_COMMUNITY)
Admission: EM | Admit: 2020-08-01 | Discharge: 2020-08-01 | Disposition: A | Discharge status: Home / Self Care | Payer: Medicare Other | Attending: Emergency Medicine | Admitting: Emergency Medicine

## 2020-08-01 ENCOUNTER — Encounter (HOSPITAL_COMMUNITY): Payer: Self-pay | Admitting: Emergency Medicine

## 2020-08-01 ENCOUNTER — Other Ambulatory Visit: Payer: Self-pay

## 2020-08-01 DIAGNOSIS — I10 Essential (primary) hypertension: Secondary | ICD-10-CM | POA: Diagnosis not present

## 2020-08-01 DIAGNOSIS — M25561 Pain in right knee: Secondary | ICD-10-CM | POA: Insufficient documentation

## 2020-08-01 DIAGNOSIS — Z87891 Personal history of nicotine dependence: Secondary | ICD-10-CM | POA: Insufficient documentation

## 2020-08-01 DIAGNOSIS — Z7982 Long term (current) use of aspirin: Secondary | ICD-10-CM | POA: Insufficient documentation

## 2020-08-01 DIAGNOSIS — I2510 Atherosclerotic heart disease of native coronary artery without angina pectoris: Secondary | ICD-10-CM | POA: Insufficient documentation

## 2020-08-01 DIAGNOSIS — M545 Low back pain, unspecified: Secondary | ICD-10-CM | POA: Diagnosis not present

## 2020-08-01 DIAGNOSIS — Z7902 Long term (current) use of antithrombotics/antiplatelets: Secondary | ICD-10-CM | POA: Diagnosis not present

## 2020-08-01 DIAGNOSIS — K5730 Diverticulosis of large intestine without perforation or abscess without bleeding: Secondary | ICD-10-CM | POA: Diagnosis not present

## 2020-08-01 DIAGNOSIS — K4090 Unilateral inguinal hernia, without obstruction or gangrene, not specified as recurrent: Secondary | ICD-10-CM | POA: Diagnosis not present

## 2020-08-01 DIAGNOSIS — I700 Atherosclerosis of aorta: Secondary | ICD-10-CM | POA: Diagnosis not present

## 2020-08-01 DIAGNOSIS — Z79899 Other long term (current) drug therapy: Secondary | ICD-10-CM | POA: Diagnosis not present

## 2020-08-01 DIAGNOSIS — K449 Diaphragmatic hernia without obstruction or gangrene: Secondary | ICD-10-CM | POA: Diagnosis not present

## 2020-08-01 MED ORDER — DICLOFENAC SODIUM 1 % EX GEL: 4.0000 g | Freq: Four times a day (QID) | CUTANEOUS | 0 refills | Status: DC

## 2020-08-01 MED ORDER — KETOROLAC TROMETHAMINE 15 MG/ML IJ SOLN
15.0000 mg | Freq: Once | INTRAMUSCULAR | Status: AC
  Administered 2020-08-01: 15 mg via INTRAMUSCULAR
  Filled 2020-08-01: qty 1

## 2020-08-01 MED ORDER — ACETAMINOPHEN 500 MG PO TABS
1000.0000 mg | ORAL_TABLET | Freq: Once | ORAL | Status: AC
  Administered 2020-08-01: 1000 mg via ORAL
  Filled 2020-08-01: qty 2

## 2020-08-01 MED ORDER — OXYCODONE HCL 5 MG PO TABS
5.0000 mg | ORAL_TABLET | Freq: Once | ORAL | Status: AC
  Administered 2020-08-01: 5 mg via ORAL
  Filled 2020-08-01: qty 1

## 2020-08-01 NOTE — ED Provider Notes (Addendum)
[facility] EMERGENCY DEPARTMENT Provider Note   CSN: [id number] Arrival date & time: 08/01/20  1153     History Chief Complaint  Patient presents with  . Back Pain    Gregory [name] is a 75 y.o. male.  75 yo M with a cc of R sided low back pain that goes down the leg.  Going on for the past week.  Also with knee pain.  Started while hanging curtains.  Denies trauma.  Difficulty ambulated due to pain.    The history is provided by the patient.  Back Pain Location:  Lumbar spine Quality:  Aching, stabbing and shooting Radiates to:  R knee Pain severity:  Severe Onset quality:  Gradual Duration:  1 week Timing:  Constant Progression:  Worsening Chronicity:  New Relieved by:  Nothing Worsened by:  Nothing Ineffective treatments:  None tried Associated symptoms: no abdominal pain, no chest pain, no fever and no headaches       Past Medical History:  Diagnosis Date  . Coronary artery disease   . Essential hypertension   . Gout   . 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 Pneumothorax    Associated with pneumonia    Patient Active Problem List   Diagnosis Date Noted  . NSTEMI (non-ST elevated myocardial infarction) (HCC) 12/06/2014  . CAD, multiple vessel 12/06/2014  . Dyslipidemia 12/06/2014  . Essential hypertension 12/06/2014  . Elevated troponin 12/04/2014  . ACS (acute coronary syndrome) (HCC) 12/04/2014  . History of inguinal hernia repair, 09/09/2012 09/28/2012    Past Surgical History:  Procedure Laterality Date  . Arm surgery    . CARDIAC CATHETERIZATION N/A 12/05/2014   Procedure: Left Heart Cath and Coronary Angiography;  Surgeon: Lyn Records, MD;  Location: [facility] INVASIVE CV LAB;  Service: Cardiovascular;  Laterality: N/A;  . CATARACT EXTRACTION W/PHACO Right 01/14/2013   Procedure: RIGHT EYE CATARACT EXTRACTION PHACO AND INTRAOCULAR LENS PLACEMENT ;  Surgeon: Gemma Payor, MD;  Location: [facility] ORS;  Service: Ophthalmology;  Laterality:  Right;  CDE 54.97  . CATARACT EXTRACTION W/PHACO Left 02/18/2013   Procedure: CATARACT EXTRACTION PHACO AND INTRAOCULAR LENS PLACEMENT (IOC);  Surgeon: Gemma Payor, MD;  Location: [facility] ORS;  Service: Ophthalmology;  Laterality: Left;  CDE 34.02  . COLONOSCOPY WITH PROPOFOL N/A 07/13/2016   Procedure: COLONOSCOPY WITH PROPOFOL;  Surgeon: Scot Jun, MD;  Location: [facility] ENDOSCOPY;  Service: Endoscopy;  Laterality: N/A;  . INGUINAL HERNIA REPAIR Right 09/09/2012   Procedure: HERNIA REPAIR INGUINAL INCARCERATED;  Surgeon: Kandis Cocking, MD;  Location: [facility] ORS;  Service: General;  Laterality: Right;  . INSERTION OF MESH Right 09/09/2012   Procedure: INSERTION OF MESH;  Surgeon: Kandis Cocking, MD;  Location: [facility] ORS;  Service: General;  Laterality: Right;       Family History  Problem Relation Age of Onset  . COPD Father   . Stroke Brother     Social History   Tobacco Use  . Smoking status: Former    Pack years: 0.00    Types: Cigarettes    Quit date: 01/28/1978    Years since quitting: 42.5  . Smokeless tobacco: Never  Vaping Use  . Vaping Use: Never used  Substance Use Topics  . Alcohol use: No    Alcohol/week: 0.0 standard drinks  . Drug use: No    Home Medications Prior to Admission medications   Medication Sig Start Date End Date Taking? Authorizing Provider  amLODipine (NORVASC) 10 MG tablet  Take 10 mg by mouth daily.   Yes [provider]  aspirin 81 MG chewable tablet Chew 1 tablet (81 mg total) by mouth daily. 12/06/14  Yes Kilroy, Luke K, PA-C  atorvastatin (LIPITOR) 20 MG tablet Take 20 mg by mouth daily.    Yes [provider]  budesonide-formoterol (SYMBICORT) 160-4.5 MCG/ACT inhaler Inhale 2 puffs into the lungs 2 (two) times daily.   Yes [provider]  clopidogrel (PLAVIX) 75 MG tablet TAKE ONE TABLET BY MOUTH ONCE DAILY   **NEEDS OFFICE VISIT** Patient taking differently: Take 75 mg by mouth daily. 03/15/16  Yes Kilroy, Luke K, PA-C   diclofenac Sodium (VOLTAREN) 1 % GEL Apply 4 g topically 4 (four) times daily. 08/01/20  Yes Melene PlanFloyd, Deshana Rominger, DO  fluticasone (FLONASE) 50 MCG/ACT nasal spray Place 1 spray into both nostrils 2 (two) times daily. 11/18/14  Yes [provider]  furosemide (LASIX) 20 MG tablet Take 20 mg by mouth daily.   Yes [provider]  isosorbide mononitrate (IMDUR) 30 MG 24 hr tablet Take 1 tablet (30 mg total) by mouth daily. 12/06/14  Yes Kilroy, Luke K, PA-C  lisinopril (PRINIVIL,ZESTRIL) 40 MG tablet Take 40 mg by mouth daily.   Yes [provider]  metoprolol tartrate (LOPRESSOR) 25 MG tablet TAKE ONE-HALF TABLET BY MOUTH TWICE DAILY Patient taking differently: Take 12.5 mg by mouth 2 (two) times daily. 11/26/15  Yes Jodelle GrossLawrence, Kathryn M, NP  traZODone (DESYREL) 50 MG tablet Take 50 mg by mouth at bedtime.   Yes [provider]  nitroGLYCERIN (NITROSTAT) 0.4 MG SL tablet Place 1 tablet (0.4 mg total) under the tongue every 5 (five) minutes x 3 doses as needed for chest pain. 12/06/14   Abelino DerrickKilroy, Luke K, PA-C    Allergies    Patient has no known allergies.  Review of Systems   Review of Systems  Constitutional:  Negative for chills and fever.  HENT:  Negative for congestion and facial swelling.   Eyes:  Negative for discharge and visual disturbance.  Respiratory:  Negative for shortness of breath.   Cardiovascular:  Negative for chest pain and palpitations.  Gastrointestinal:  Negative for abdominal pain, diarrhea and vomiting.  Musculoskeletal:  Positive for back pain. Negative for arthralgias and myalgias.  Skin:  Negative for color change and rash.  Neurological:  Negative for tremors, syncope and headaches.  Psychiatric/Behavioral:  Negative for confusion and dysphoric mood.    Physical Exam Updated Vital Signs BP (!) 100/59 (BP Location: Right Arm)   Pulse 75   Temp 97.6 F (36.4 C) (Oral)   Resp 16   SpO2 94%   Physical Exam Vitals and nursing note  reviewed.  Constitutional:      Appearance: He is well-developed.  HENT:     Head: Normocephalic and atraumatic.  Eyes:     Pupils: Pupils are equal, round, and reactive to light.  Neck:     Vascular: No JVD.  Cardiovascular:     Rate and Rhythm: Normal rate and regular rhythm.     Heart sounds: No murmur heard.   No friction rub. No gallop.  Pulmonary:     Effort: No respiratory distress.     Breath sounds: No wheezing.  Abdominal:     General: There is no distension.     Tenderness: There is no abdominal tenderness. There is no guarding or rebound.  Musculoskeletal:        General: Tenderness present. Normal range of motion.  Cervical back: Normal range of motion and neck supple.     Comments: PMS intact to BLE.  Positive straight leg raise test on the right.  Pain to the right paraspinal musculature about the SI joint.  No appreciable weakness.  Reflexes diminished bilaterally slightly worse on the right than the left.  No clonus.  Ambulates with a painful gait.  Skin:    Coloration: Skin is not pale.     Findings: No rash.  Neurological:     Mental Status: He is alert and oriented to person, place, and time.  Psychiatric:        Behavior: Behavior normal.    ED Results / Procedures / Treatments   Labs (all labs ordered are listed, but only abnormal results are displayed) Labs Reviewed - No data to display  EKG None  Radiology CT L-SPINE NO CHARGE  Result Date: 08/01/2020 CLINICAL DATA:  Low back pain radiating to the right lower extremity for 1 week. No known injury. EXAM: CT LUMBAR SPINE WITHOUT CONTRAST TECHNIQUE: Multidetector CT imaging of the lumbar spine was performed without intravenous contrast administration. Multiplanar CT image reconstructions were also generated. COMPARISON:  Plain films lumbar spine 04/17/2016. FINDINGS: Segmentation: Standard. Alignment: Maintained. Vertebrae: No fracture or worrisome lesion. Chronic bilateral L5 pars interarticularis  defects noted. Paraspinal and other soft tissues: Extensive atherosclerotic vascular disease is seen. No aortic aneurysm. Disc levels: T12-L1: Mild-to-moderate facet degenerative disease, worse on the left. Otherwise negative. L1-2: Mild-to-moderate bilateral facet arthropathy. There is a shallow disc bulge. Mild central canal stenosis. Foramina appear open. L2-3: Mild-to-moderate facet arthropathy is worse on the right. There is a shallow disc bulge. Mild central canal narrowing is seen. Neural foramina appear open. L3-4: Mild-to-moderate bilateral facet degenerative disease and a shallow disc bulge. Moderate central canal stenosis and mild to moderate right foraminal narrowing. The left foramen appears open. L4-5: Loss of disc space height and vacuum disc phenomenon. Patient has a disc bulge and more focally protruding disc with endplate spurring in the left foramen. Moderate to moderately severe central canal and bilateral subarticular recess narrowing. Moderately severe to severe foraminal narrowing is worse on the left. L5-S1: Minimal disc bulge. The central canal and foramina appear open. IMPRESSION: No acute abnormality. 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 Moderately severe to severe foraminal narrowing at L4-5 appears worse on the left. Chronic bilateral L5 pars interarticularis defects without anterolisthesis L5 on S1. The central canal and foramina appear open at L5-S1. Electronically Signed   By: Drusilla Kanner M.D.   On: 08/01/2020 15:18   CT Renal Stone Study  Result Date: 08/01/2020 CLINICAL DATA:  Flank pain, kidney stone suspected EXAM: CT ABDOMEN AND PELVIS WITHOUT CONTRAST TECHNIQUE: Multidetector CT imaging of the abdomen and pelvis was performed following the standard protocol without IV contrast. COMPARISON:  CT March 07, 2015 FINDINGS: Lower chest: No acute abnormality. Coronary artery  calcifications. Normal size heart. No significant pericardial effusion/thickening. Hepatobiliary: Unremarkable noncontrast appearance of the hepatic parenchyma. Gallbladder is unremarkable. No biliary ductal dilation. Pancreas: Within normal limits. Spleen: Within normal limits. Adrenals/Urinary Tract: Bilateral adrenal glands are unremarkable. No hydronephrosis. No renal, ureteral or bladder calculi visualized. Decreased size of the right upper pole exophytic hypodense renal lesion now measuring 1.7 cm previously 3.6 cm, likely representing an involuting cyst. Subcentimeter hypodense bilateral renal lesions similar prior study, favored to represent cysts. Exophytic 9 mm left lower pole renal lesion previously  hypodense and measuring 1.5 cm now with Hounsfield units greater than that expected for a simple cyst, possibly representing a hemorrhagic/proteinaceous cyst but incompletely evaluated without IV contrast material. Urinary bladder is grossly unremarkable for degree of distension. Stomach/Bowel: Small hiatal hernia otherwise the stomach is grossly unremarkable. Normal positioning of the duodenum/ligament of Treitz. No pathologic dilation of small bowel. The appendix and terminal ileum appear normal. Sigmoid colonic diverticulosis without findings of acute diverticulitis. Vascular/Lymphatic: Aortic atherosclerosis without aneurysmal dilation. No pathologically enlarged abdominal or pelvic lymph nodes. Reproductive: Dystrophic calcifications within a mildly prominent prostate gland. Other: Prior right inguinal hernia repair. Fat containing left inguinal hernia unchanged from prior. No abdominopelvic ascites. Musculoskeletal: Chronic bilateral L5 pars defects without significant listhesis. Similar mild to moderate multilevel degenerative changes spine. No acute osseous abnormality. IMPRESSION: 1. No hydronephrosis.  No renal, ureteral or bladder calculi. 2. Sigmoid colonic diverticulosis without findings of acute  diverticulitis. 3. Decreased size of the right upper pole exophytic hypodense renal lesion now measuring 1.7 cm previously 3.6 cm, likely representing an involuting renal cyst. 4. Exophytic 9 mm left lower pole renal lesion previously hypodense and measuring 1.5 cm now with Hounsfield units greater than that expected for a simple cyst, possibly representing a hemorrhagic/proteinaceous cyst but incompletely evaluated without IV contrast material. Consider further evaluation with nonemergent renal protocol MRI or CT with without contrast. 5.  Aortic Atherosclerosis (ICD10-I70.0). Electronically Signed   By: Maudry Mayhew MD   On: 08/01/2020 15:16    Procedures Procedures   Medications Ordered in ED Medications  acetaminophen (TYLENOL) tablet 1,000 mg (1,000 mg Oral Given 08/01/20 1435)  oxyCODONE (Oxy IR/ROXICODONE) immediate release tablet 5 mg (5 mg Oral Given 08/01/20 1435)  ketorolac (TORADOL) 15 MG/ML injection 15 mg (15 mg Intramuscular Given 08/01/20 1436)    ED Course  I have reviewed the triage vital signs and the nursing notes.  Pertinent labs & imaging results that were available during my care of the patient were reviewed by me and considered in my medical decision making (see chart for details).    MDM Rules/Calculators/A&P                          75 yo M with a cc of low back pain, going on for the past week.  Likely musculoskeletal.  Atraumatic.    CT scan is negative for acute pathology.  Will treat as musculoskeletal back pain.  PCP follow-up.  3:34 PM:  I have discussed the diagnosis/risks/treatment options with the patient and family and believe the pt to be eligible for discharge home to follow-up with PCP. We also discussed returning to the ED immediately if new or worsening sx occur. We discussed the sx which are most concerning (e.g., sudden worsening pain, fever, inability to tolerate by mouth, cauda equina s/sx) that necessitate immediate return. Medications administered  to the patient during their visit and any new prescriptions provided to the patient are listed below.  Medications given during this visit Medications  acetaminophen (TYLENOL) tablet 1,000 mg (1,000 mg Oral Given 08/01/20 1435)  oxyCODONE (Oxy IR/ROXICODONE) immediate release tablet 5 mg (5 mg Oral Given 08/01/20 1435)  ketorolac (TORADOL) 15 MG/ML injection 15 mg (15 mg Intramuscular Given 08/01/20 1436)     The patient appears reasonably screen and/or stabilized for discharge and I doubt any other medical condition or other [facility] requiring further screening, evaluation, or treatment in the ED at this time prior to discharge.  Final Clinical Impression(s) / ED Diagnoses Final diagnoses:  Low back pain    Rx / DC Orders ED Discharge Orders          Ordered    diclofenac Sodium (VOLTAREN) 1 % GEL  4 times daily        08/01/20 1525             Melene Plan, DO 08/01/20 1535    Melene Plan, DO 08/02/20 [phone]

## 2020-08-01 NOTE — Discharge Instructions (Addendum)
Your back pain is most likely due to a muscular strain.  There is been a lot of research on back pain, unfortunately the only thing that seems to really help is Tylenol and ibuprofen.  Relative rest is also important to not lift greater than 10 pounds bending or twisting at the waist.  Please follow-up with your family physician.  The other thing that really seems to benefit patients is physical therapy which your doctor may send you for.  Please return to the emergency department for new numbness or weakness to your arms or legs. Difficulty with urinating or urinating or pooping on yourself.  Also if you cannot feel toilet paper when you wipe or get a fever.   Take tylenol 1000mg(2 extra strength) four times a day.   

## 2020-08-01 NOTE — ED Triage Notes (Signed)
C/o pain to R lower back that radiates to R hip, R leg, and R knee x 1 week.  States R knee "gave way" today.  Denies fall.  No known injury.

## 2020-08-10 DIAGNOSIS — I10 Essential (primary) hypertension: Secondary | ICD-10-CM | POA: Diagnosis not present

## 2020-08-10 DIAGNOSIS — I2510 Atherosclerotic heart disease of native coronary artery without angina pectoris: Secondary | ICD-10-CM | POA: Diagnosis not present

## 2020-08-10 DIAGNOSIS — E785 Hyperlipidemia, unspecified: Secondary | ICD-10-CM | POA: Diagnosis not present

## 2020-08-10 DIAGNOSIS — I509 Heart failure, unspecified: Secondary | ICD-10-CM | POA: Diagnosis not present

## 2020-08-10 DIAGNOSIS — R0602 Shortness of breath: Secondary | ICD-10-CM | POA: Diagnosis not present

## 2020-08-10 DIAGNOSIS — I739 Peripheral vascular disease, unspecified: Secondary | ICD-10-CM | POA: Diagnosis not present

## 2020-08-17 DIAGNOSIS — I2510 Atherosclerotic heart disease of native coronary artery without angina pectoris: Secondary | ICD-10-CM | POA: Diagnosis not present

## 2020-08-17 DIAGNOSIS — R0602 Shortness of breath: Secondary | ICD-10-CM | POA: Diagnosis not present

## 2020-08-17 DIAGNOSIS — I509 Heart failure, unspecified: Secondary | ICD-10-CM | POA: Diagnosis not present

## 2020-08-18 DIAGNOSIS — I2510 Atherosclerotic heart disease of native coronary artery without angina pectoris: Secondary | ICD-10-CM | POA: Diagnosis not present

## 2020-08-18 DIAGNOSIS — R079 Chest pain, unspecified: Secondary | ICD-10-CM | POA: Diagnosis not present

## 2020-08-18 DIAGNOSIS — R0602 Shortness of breath: Secondary | ICD-10-CM | POA: Diagnosis not present

## 2020-08-18 DIAGNOSIS — I509 Heart failure, unspecified: Secondary | ICD-10-CM | POA: Diagnosis not present

## 2020-08-18 DIAGNOSIS — I10 Essential (primary) hypertension: Secondary | ICD-10-CM | POA: Diagnosis not present

## 2020-08-27 DIAGNOSIS — I10 Essential (primary) hypertension: Secondary | ICD-10-CM | POA: Diagnosis not present

## 2020-08-27 DIAGNOSIS — R0602 Shortness of breath: Secondary | ICD-10-CM | POA: Diagnosis not present

## 2020-08-27 DIAGNOSIS — E785 Hyperlipidemia, unspecified: Secondary | ICD-10-CM | POA: Diagnosis not present

## 2020-08-27 DIAGNOSIS — I2510 Atherosclerotic heart disease of native coronary artery without angina pectoris: Secondary | ICD-10-CM | POA: Diagnosis not present

## 2020-08-27 DIAGNOSIS — I739 Peripheral vascular disease, unspecified: Secondary | ICD-10-CM | POA: Diagnosis not present

## 2020-08-31 ENCOUNTER — Emergency Department (HOSPITAL_COMMUNITY): Payer: Medicare Other

## 2020-08-31 ENCOUNTER — Encounter (HOSPITAL_COMMUNITY): Payer: Self-pay

## 2020-08-31 ENCOUNTER — Observation Stay (HOSPITAL_COMMUNITY)
Admission: EM | Admit: 2020-08-31 | Discharge: 2020-09-02 | Disposition: A | Discharge status: Home / Self Care | Payer: Medicare Other | Attending: Internal Medicine | Admitting: Internal Medicine

## 2020-08-31 ENCOUNTER — Other Ambulatory Visit: Payer: Self-pay

## 2020-08-31 DIAGNOSIS — R6889 Other general symptoms and signs: Secondary | ICD-10-CM | POA: Diagnosis not present

## 2020-08-31 DIAGNOSIS — R55 Syncope and collapse: Secondary | ICD-10-CM | POA: Diagnosis not present

## 2020-08-31 DIAGNOSIS — N179 Acute kidney failure, unspecified: Secondary | ICD-10-CM | POA: Insufficient documentation

## 2020-08-31 DIAGNOSIS — E860 Dehydration: Secondary | ICD-10-CM

## 2020-08-31 DIAGNOSIS — E785 Hyperlipidemia, unspecified: Secondary | ICD-10-CM | POA: Diagnosis present

## 2020-08-31 DIAGNOSIS — Z7982 Long term (current) use of aspirin: Secondary | ICD-10-CM | POA: Insufficient documentation

## 2020-08-31 DIAGNOSIS — I517 Cardiomegaly: Secondary | ICD-10-CM | POA: Diagnosis not present

## 2020-08-31 DIAGNOSIS — I2510 Atherosclerotic heart disease of native coronary artery without angina pectoris: Secondary | ICD-10-CM | POA: Diagnosis not present

## 2020-08-31 DIAGNOSIS — Z87891 Personal history of nicotine dependence: Secondary | ICD-10-CM | POA: Diagnosis not present

## 2020-08-31 DIAGNOSIS — M79661 Pain in right lower leg: Secondary | ICD-10-CM | POA: Insufficient documentation

## 2020-08-31 DIAGNOSIS — Z79899 Other long term (current) drug therapy: Secondary | ICD-10-CM | POA: Insufficient documentation

## 2020-08-31 DIAGNOSIS — R404 Transient alteration of awareness: Secondary | ICD-10-CM | POA: Diagnosis not present

## 2020-08-31 DIAGNOSIS — I10 Essential (primary) hypertension: Secondary | ICD-10-CM | POA: Diagnosis not present

## 2020-08-31 DIAGNOSIS — Z20822 Contact with and (suspected) exposure to covid-19: Secondary | ICD-10-CM | POA: Diagnosis not present

## 2020-08-31 DIAGNOSIS — Z743 Need for continuous supervision: Secondary | ICD-10-CM | POA: Diagnosis not present

## 2020-08-31 DIAGNOSIS — Z7901 Long term (current) use of anticoagulants: Secondary | ICD-10-CM | POA: Insufficient documentation

## 2020-08-31 DIAGNOSIS — R4020 Unspecified coma: Secondary | ICD-10-CM | POA: Diagnosis not present

## 2020-08-31 DIAGNOSIS — I959 Hypotension, unspecified: Secondary | ICD-10-CM | POA: Diagnosis not present

## 2020-08-31 DIAGNOSIS — R0689 Other abnormalities of breathing: Secondary | ICD-10-CM | POA: Diagnosis not present

## 2020-08-31 LAB — CBC WITH DIFFERENTIAL/PLATELET
Abs Immature Granulocytes: 0.07 10*3/uL (ref 0.00–0.07)
Basophils Absolute: 0 10*3/uL (ref 0.0–0.1)
Basophils Relative: 0 %
Eosinophils Absolute: 0.1 10*3/uL (ref 0.0–0.5)
Eosinophils Relative: 1 %
HCT: 43.5 % (ref 39.0–52.0)
Hemoglobin: 14.2 g/dL (ref 13.0–17.0)
Immature Granulocytes: 1 %
Lymphocytes Relative: 18 %
Lymphs Abs: 2 10*3/uL (ref 0.7–4.0)
MCH: 30.3 pg (ref 26.0–34.0)
MCHC: 32.6 g/dL (ref 30.0–36.0)
MCV: 92.8 fL (ref 80.0–100.0)
Monocytes Absolute: 0.8 10*3/uL (ref 0.1–1.0)
Monocytes Relative: 7 %
Neutro Abs: 8.2 10*3/uL — ABNORMAL HIGH (ref 1.7–7.7)
Neutrophils Relative %: 73 %
Platelets: 270 10*3/uL (ref 150–400)
RBC: 4.69 MIL/uL (ref 4.22–5.81)
RDW: 14.5 % (ref 11.5–15.5)
WBC: 11.1 10*3/uL — ABNORMAL HIGH (ref 4.0–10.5)
nRBC: 0 % (ref 0.0–0.2)

## 2020-08-31 LAB — TROPONIN I (HIGH SENSITIVITY)
Troponin I (High Sensitivity): 21 ng/L — ABNORMAL HIGH (ref <18)
Troponin I (High Sensitivity): 22 ng/L — ABNORMAL HIGH (ref <18)
Troponin I (High Sensitivity): 21 ng/L — ABNORMAL HIGH (ref <18)

## 2020-08-31 LAB — COMPREHENSIVE METABOLIC PANEL
ALT: 18 U/L (ref 0–44)
AST: 25 U/L (ref 15–41)
Albumin: 4.2 g/dL (ref 3.5–5.0)
Alkaline Phosphatase: 40 U/L (ref 38–126)
Anion gap: 5 (ref 5–15)
BUN: 24 mg/dL — ABNORMAL HIGH (ref 8–23)
CO2: 20 mmol/L — ABNORMAL LOW (ref 22–32)
Calcium: 8.9 mg/dL (ref 8.9–10.3)
Chloride: 110 mmol/L (ref 98–111)
Creatinine, Ser: 1.68 mg/dL — ABNORMAL HIGH (ref 0.61–1.24)
GFR, Estimated: 43 mL/min — ABNORMAL LOW (ref >60)
Glucose, Bld: 110 mg/dL — ABNORMAL HIGH (ref 70–99)
Potassium: 4.5 mmol/L (ref 3.5–5.1)
Sodium: 135 mmol/L (ref 135–145)
Total Bilirubin: 1.3 mg/dL — ABNORMAL HIGH (ref 0.3–1.2)
Total Protein: 7.6 g/dL (ref 6.5–8.1)

## 2020-08-31 LAB — MAGNESIUM: Magnesium: 2.1 mg/dL (ref 1.7–2.4)

## 2020-08-31 LAB — RESP PANEL BY RT-PCR (FLU A&B, COVID) ARPGX2
Influenza A by PCR: NEGATIVE
Influenza B by PCR: NEGATIVE
SARS Coronavirus 2 by RT PCR: NEGATIVE

## 2020-08-31 MED ORDER — SODIUM CHLORIDE 0.9 % IV BOLUS
500.0000 mL | Freq: Once | INTRAVENOUS | Status: AC
  Administered 2020-08-31: 500 mL via INTRAVENOUS

## 2020-08-31 MED ORDER — ACETAMINOPHEN 650 MG RE SUPP: 650.0000 mg | Freq: Four times a day (QID) | RECTAL | Status: DC | PRN

## 2020-08-31 MED ORDER — BUDESON-GLYCOPYRROL-FORMOTEROL 160-9-4.8 MCG/ACT IN AERO: 2.0000 | INHALATION_SPRAY | Freq: Two times a day (BID) | RESPIRATORY_TRACT | Status: DC

## 2020-08-31 MED ORDER — TRAZODONE HCL 50 MG PO TABS
50.0000 mg | ORAL_TABLET | Freq: Every day | ORAL | Status: DC
  Administered 2020-08-31 – 2020-09-01 (×2): 50 mg via ORAL
  Filled 2020-08-31 (×2): qty 1

## 2020-08-31 MED ORDER — SODIUM CHLORIDE 0.9 % IV SOLN: INTRAVENOUS | Status: AC

## 2020-08-31 MED ORDER — ONDANSETRON HCL 4 MG/2ML IJ SOLN: 4.0000 mg | Freq: Four times a day (QID) | INTRAMUSCULAR | Status: DC | PRN

## 2020-08-31 MED ORDER — ONDANSETRON HCL 4 MG PO TABS: 4.0000 mg | ORAL_TABLET | Freq: Four times a day (QID) | ORAL | Status: DC | PRN

## 2020-08-31 MED ORDER — ASPIRIN 81 MG PO CHEW
81.0000 mg | CHEWABLE_TABLET | Freq: Every day | ORAL | Status: DC
  Administered 2020-09-01 – 2020-09-02 (×2): 81 mg via ORAL
  Filled 2020-08-31 (×2): qty 1

## 2020-08-31 MED ORDER — ENOXAPARIN SODIUM 40 MG/0.4ML IJ SOSY
40.0000 mg | PREFILLED_SYRINGE | INTRAMUSCULAR | Status: DC
  Administered 2020-08-31 – 2020-09-01 (×2): 40 mg via SUBCUTANEOUS
  Filled 2020-08-31 (×2): qty 0.4

## 2020-08-31 MED ORDER — CLOPIDOGREL BISULFATE 75 MG PO TABS
75.0000 mg | ORAL_TABLET | Freq: Every day | ORAL | Status: DC
  Administered 2020-09-01 – 2020-09-02 (×2): 75 mg via ORAL
  Filled 2020-08-31 (×2): qty 1

## 2020-08-31 MED ORDER — FLUTICASONE FUROATE-VILANTEROL 200-25 MCG/INH IN AEPB
1.0000 | INHALATION_SPRAY | Freq: Every day | RESPIRATORY_TRACT | Status: DC
  Administered 2020-09-01 – 2020-09-02 (×2): 1 via RESPIRATORY_TRACT
  Filled 2020-08-31: qty 28

## 2020-08-31 MED ORDER — ACETAMINOPHEN 325 MG PO TABS: 650.0000 mg | ORAL_TABLET | Freq: Four times a day (QID) | ORAL | Status: DC | PRN

## 2020-08-31 MED ORDER — ROSUVASTATIN CALCIUM 20 MG PO TABS
40.0000 mg | ORAL_TABLET | Freq: Every day | ORAL | Status: DC
  Administered 2020-09-01 – 2020-09-02 (×2): 40 mg via ORAL
  Filled 2020-08-31 (×2): qty 2

## 2020-08-31 MED ORDER — POLYETHYLENE GLYCOL 3350 17 G PO PACK: 17.0000 g | PACK | Freq: Every day | ORAL | Status: DC | PRN

## 2020-08-31 NOTE — ED Triage Notes (Signed)
Patient to ED via EMS with complaints of near syncope. Per EMS when found he was hypotensive and diaphoretic. Takes HTN medications at bedtime. Complaining of RLQ pain.

## 2020-08-31 NOTE — ED Provider Notes (Signed)
[facility] EMERGENCY DEPARTMENT Provider Note   CSN: [id number] Arrival date & time: 08/31/20  1249     History Chief Complaint  Patient presents with   Near Syncope    Gregory [name] is a 75 y.o. male.  75 year old male with history of coronary artery disease, hypertension, prior MI presents with complaint of feeling unwell earlier today.  Patient states that he was sitting under a tree with his daughter when he suddenly became diaphoretic and felt unwell.  Patient's daughter put a cool rag on his face and called 911.  Daughter is not at the bedside at this time however patient states that his daughter told him that he turned purple.  He feels like everything got blurry and felt like he was going to pass out.  EMS arrived him patient was found to be hypotensive and was given IV fluids and transferred to the hospital.  Patient reports feeling somewhat better at this time however feels generally weak.  He denies having any chest pain or shortness of breath throughout the event, denies nausea or vomiting.  Reports having his usual day and feeling at baseline prior to this episode.  No history of similar symptoms previously.  Does take medication for blood pressure at night.      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   Pneumothorax    Associated with pneumonia    Patient Active Problem List   Diagnosis Date Noted   Syncope 08/31/2020   NSTEMI (non-ST elevated myocardial infarction) (HCC) 12/06/2014   CAD, multiple vessel 12/06/2014   Dyslipidemia 12/06/2014   Essential hypertension 12/06/2014   Elevated troponin 12/04/2014   ACS (acute coronary syndrome) (HCC) 12/04/2014   History of inguinal hernia repair, 09/09/2012 09/28/2012    Past Surgical History:  Procedure Laterality Date   Arm surgery     CARDIAC CATHETERIZATION N/A 12/05/2014   Procedure: Left Heart Cath and Coronary  Angiography;  Surgeon: Lyn Records, MD;  Location: [facility] INVASIVE CV LAB;  Service: Cardiovascular;  Laterality: N/A;   CATARACT EXTRACTION W/PHACO Right 01/14/2013   Procedure: RIGHT EYE CATARACT EXTRACTION PHACO AND INTRAOCULAR LENS PLACEMENT ;  Surgeon: Gemma Payor, MD;  Location: [facility] ORS;  Service: Ophthalmology;  Laterality: Right;  CDE 54.97   CATARACT EXTRACTION W/PHACO Left 02/18/2013   Procedure: CATARACT EXTRACTION PHACO AND INTRAOCULAR LENS PLACEMENT (IOC);  Surgeon: Gemma Payor, MD;  Location: [facility] ORS;  Service: Ophthalmology;  Laterality: Left;  CDE 34.02   COLONOSCOPY WITH PROPOFOL N/A 07/13/2016   Procedure: COLONOSCOPY WITH PROPOFOL;  Surgeon: Scot Jun, MD;  Location: [facility] ENDOSCOPY;  Service: Endoscopy;  Laterality: N/A;   INGUINAL HERNIA REPAIR Right 09/09/2012   Procedure: HERNIA REPAIR INGUINAL INCARCERATED;  Surgeon: Kandis Cocking, MD;  Location: [facility] ORS;  Service: General;  Laterality: Right;   INSERTION OF MESH Right 09/09/2012   Procedure: INSERTION OF MESH;  Surgeon: Kandis Cocking, MD;  Location: [facility] ORS;  Service: General;  Laterality: Right;       Family History  Problem Relation Age of Onset   COPD Father    Stroke Brother     Social History   Tobacco Use   Smoking status: Former    Types: Cigarettes    Quit date: 01/28/1978    Years since quitting: 42.6   Smokeless tobacco: Never  Vaping Use   Vaping Use:  Never used  Substance Use Topics   Alcohol use: No    Alcohol/week: 0.0 standard drinks   Drug use: No    Home Medications Prior to Admission medications   Medication Sig Start Date End Date Taking? Authorizing Provider  amLODipine (NORVASC) 10 MG tablet Take 10 mg by mouth daily.   Yes [provider]  aspirin 81 MG chewable tablet Chew 1 tablet (81 mg total) by mouth daily. 12/06/14  Yes Kilroy, Luke K, PA-C  BREZTRI AEROSPHERE 160-9-4.8 MCG/ACT AERO Inhale 2 puffs into the lungs 2 (two) times daily. 08/17/20  Yes [provider]  celecoxib (CELEBREX) 200 MG capsule Take 200-400 mg by mouth daily as needed. 06/29/20  Yes [provider]  clopidogrel (PLAVIX) 75 MG tablet TAKE ONE TABLET BY MOUTH ONCE DAILY   **NEEDS OFFICE VISIT** Patient taking differently: Take 75 mg by mouth daily. 03/15/16  Yes Kilroy, Luke K, PA-C  diclofenac Sodium (VOLTAREN) 1 % GEL Apply 4 g topically 4 (four) times daily. 08/01/20  Yes Melene Plan, DO  fluticasone (FLONASE) 50 MCG/ACT nasal spray Place 1 spray into both nostrils 2 (two) times daily. 11/18/14  Yes [provider]  Fluticasone Propionate, Inhal, (FLOVENT DISKUS) 100 MCG/BLIST AEPB Inhale into the lungs.   Yes [provider]  furosemide (LASIX) 20 MG tablet Take 20 mg by mouth daily.   Yes [provider]  isosorbide mononitrate (IMDUR) 30 MG 24 hr tablet Take 1 tablet (30 mg total) by mouth daily. 12/06/14  Yes Kilroy, Luke K, PA-C  lisinopril (PRINIVIL,ZESTRIL) 40 MG tablet Take 40 mg by mouth daily.   Yes [provider]  metoprolol tartrate (LOPRESSOR) 25 MG tablet TAKE ONE-HALF TABLET BY MOUTH TWICE DAILY Patient taking differently: Take 12.5 mg by mouth 2 (two) times daily. 11/26/15  Yes Jodelle Gross, NP  nitroGLYCERIN (NITROSTAT) 0.4 MG SL tablet Place 1 tablet (0.4 mg total) under the tongue every 5 (five) minutes x 3 doses as needed for chest pain. 12/06/14  Yes Kilroy, Luke K, PA-C  rosuvastatin (CRESTOR) 40 MG tablet Take 40 mg by mouth daily. 06/29/20  Yes [provider]  spironolactone (ALDACTONE) 25 MG tablet Take 25 mg by mouth daily. 08/10/20  Yes [provider]  traZODone (DESYREL) 50 MG tablet Take 50 mg by mouth at bedtime.   Yes [provider]  atorvastatin (LIPITOR) 20 MG tablet Take 20 mg by mouth daily.  Patient not taking: Reported on 08/31/2020    [provider]  budesonide-formoterol (SYMBICORT) 160-4.5 MCG/ACT inhaler Inhale 2 puffs into the lungs 2 (two) times  daily. Patient not taking: Reported on 08/31/2020    [provider]  cetirizine (ZYRTEC) 10 MG tablet Take by mouth. Patient not taking: No sig reported    [provider]    Allergies    Patient has no known allergies.  Review of Systems   Review of Systems  Constitutional:  Positive for diaphoresis. Negative for chills and fever.  Eyes:  Negative for visual disturbance.  Respiratory:  Negative for shortness of breath.   Cardiovascular:  Negative for chest pain, palpitations and leg swelling.  Gastrointestinal:  Negative for abdominal pain, constipation, diarrhea, nausea and vomiting.  Musculoskeletal:  Negative for back pain and myalgias.  Skin:  Negative for rash and wound.  Neurological:  Positive for weakness. Negative for dizziness, syncope and speech difficulty.  Hematological:  Negative for adenopathy.  Psychiatric/Behavioral:  Negative for confusion.   All other systems  reviewed and are negative.  Physical Exam Updated Vital Signs BP 122/60   Pulse 77   Temp 98.8 F (37.1 C) (Oral)   Resp (!) 25   Ht 5\' 4"  (1.626 m)   Wt 94.3 kg   SpO2 98%   BMI 35.70 kg/m   Physical Exam Vitals and nursing note reviewed.  Constitutional:      General: He is not in acute distress.    Appearance: He is well-developed. He is not diaphoretic.  HENT:     Head: Normocephalic and atraumatic.     Mouth/Throat:     Mouth: Mucous membranes are moist.  Eyes:     Extraocular Movements: Extraocular movements intact.     Pupils: Pupils are equal, round, and reactive to light.  Cardiovascular:     Rate and Rhythm: Normal rate and regular rhythm.     Pulses: Normal pulses.     Heart sounds: Normal heart sounds.  Pulmonary:     Effort: Pulmonary effort is normal.     Breath sounds: Normal breath sounds.  Abdominal:     Palpations: Abdomen is soft.     Tenderness: There is no abdominal tenderness.  Musculoskeletal:        General: No swelling or tenderness.      Cervical back: Neck supple.     Right lower leg: No edema.     Left lower leg: No edema.     Comments: TTP right lateral hip  Skin:    General: Skin is warm and dry.  Neurological:     Mental Status: He is alert and oriented to person, place, and time.     Sensory: No sensory deficit.     Motor: No weakness.  Psychiatric:        Behavior: Behavior normal.    ED Results / Procedures / Treatments   Labs (all labs ordered are listed, but only abnormal results are displayed) Labs Reviewed  COMPREHENSIVE METABOLIC PANEL - Abnormal; Notable for the following components:      Result Value   CO2 20 (*)    Glucose, Bld 110 (*)    BUN 24 (*)    Creatinine, Ser 1.68 (*)    Total Bilirubin 1.3 (*)    GFR, Estimated 43 (*)    All other components within normal limits  CBC WITH DIFFERENTIAL/PLATELET - Abnormal; Notable for the following components:   WBC 11.1 (*)    Neutro Abs 8.2 (*)    All other components within normal limits  TROPONIN I (HIGH SENSITIVITY) - Abnormal; Notable for the following components:   Troponin I (High Sensitivity) 21 (*)    All other components within normal limits  TROPONIN I (HIGH SENSITIVITY) - Abnormal; Notable for the following components:   Troponin I (High Sensitivity) 22 (*)    All other components within normal limits  RESP PANEL BY RT-PCR (FLU A&B, COVID) [id number]    EKG EKG Interpretation  Date/Time:  Monday August 31 2020 12:58:44 EDT Ventricular Rate:  54 PR Interval:  188 QRS Duration: 105 QT Interval:  464 QTC Calculation: 440 R Axis:   -18 Text Interpretation: Sinus rhythm Borderline left axis deviation Low voltage, precordial leads Minimal ST elevation, inferior leads Baseline wander in lead(s) V1 No significant change since last tracing Confirmed by [id number] [phone]) on 08/31/2020 4:20:15 PM  Radiology 10/31/2020 Venous Img Lower Right (DVT Study)  Result Date: 08/31/2020 CLINICAL DATA:  Right lower extremity pain. EXAM: RIGHT LOWER EXTREMITY  VENOUS DOPPLER  ULTRASOUND TECHNIQUE: Gray-scale sonography with graded compression, as well as color Doppler and duplex ultrasound were performed to evaluate the lower extremity deep venous systems from the level of the common femoral vein and including the common femoral, femoral, profunda femoral, popliteal and calf veins including the posterior tibial, peroneal and gastrocnemius veins when visible. The superficial great saphenous vein was also interrogated. Spectral Doppler was utilized to evaluate flow at rest and with distal augmentation maneuvers in the common femoral, femoral and popliteal veins. COMPARISON:  None. FINDINGS: Contralateral Common Femoral Vein: Respiratory phasicity is normal and symmetric with the symptomatic side. No evidence of thrombus. Normal compressibility. Common Femoral Vein: No evidence of thrombus. Normal compressibility, respiratory phasicity and response to augmentation. Saphenofemoral Junction: No evidence of thrombus. Normal compressibility and flow on color Doppler imaging. Profunda Femoral Vein: No evidence of thrombus. Normal compressibility and flow on color Doppler imaging. Femoral Vein: No evidence of thrombus. Normal compressibility, respiratory phasicity and response to augmentation. Popliteal Vein: No evidence of thrombus. Normal compressibility, respiratory phasicity and response to augmentation. Calf Veins: No evidence of thrombus. Normal compressibility and flow on color Doppler imaging. Superficial Great Saphenous Vein: No evidence of thrombus. Normal compressibility. Venous Reflux:  None. Other Findings:  None. IMPRESSION: Negative for deep venous thrombosis in right lower extremity. Electronically Signed   By: Richarda Overlie M.D.   On: 08/31/2020 17:22   DG Chest Port 1 View  Result Date: 08/31/2020 CLINICAL DATA:  Near syncope EXAM: PORTABLE CHEST 1 VIEW COMPARISON:  Chest radiograph 03/07/2015 FINDINGS: The heart is mildly enlarged, unchanged. The mediastinal  contours are within normal limits. There is slight asymmetric elevation of the right hemidiaphragm, unchanged. There is no focal consolidation or pulmonary edema. There is no pleural effusion or pneumothorax. There is degenerative change of both shoulders. There is no acute osseous abnormality. IMPRESSION: Unchanged mild cardiomegaly. Otherwise, no radiographic evidence of acute cardiopulmonary process. Electronically Signed   By: Lesia Hausen MD   On: 08/31/2020 14:30    Procedures Procedures   Medications Ordered in ED Medications  sodium chloride 0.9 % bolus 500 mL (0 mLs Intravenous Stopped 08/31/20 1634)    ED Course  I have reviewed the triage vital signs and the nursing notes.  Pertinent labs & imaging results that were available during my care of the patient were reviewed by me and considered in my medical decision making (see chart for details).  Clinical Course as of 08/31/20 1752  Mon Aug 31, 2020  [id number] 75 year old male brought in by EMS as above for concern for near syncope, found to be hypotensive.  Patient has had some improvement with his blood pressure with IV fluids.  Reports feeling generally weak although somewhat improved.  States that he was scheduled to have an ultrasound of his right leg to rule out blood clot in the leg on August 16 due to having pain in his leg at the right lateral hip radiating to the right knee for the past 3 months or so which causes his knee to give out on him at times.  History and physical exam more consistent with musculoskeletal origin of his pain however will evaluate for DVT with his near syncopal event today. CBC and CMP without significant changes from baseline.  Initial troponin is 21, pending repeat.  Plan is to admit due to hypotension.  Case discussed with Dr. Lynelle Doctor, ER attending, agrees with plan of care. [LM]  1716 After is done at bedside, states that patient was sitting  in the chair under the tree with her today for about 45 minutes  talking with the neighbors when he scooted forward on the chair and leaned back and said that his leg hurt and that he was going to lay back a bit.  Patient then passed out and was "in and out of it."  Until EMS arrived. [LM]  1730 Discussed with Dr. Mariea ClontsEmokpae with [facility] hospitalist service will consult for admission. [LM]    Clinical Course User Index [LM] Alden HippMurphy, Jaliah Foody A, PA-C   MDM Rules/Calculators/A&P                           Final Clinical Impression(s) / ED Diagnoses Final diagnoses:  Near syncope  Hypotension, unspecified hypotension type  Dehydration    Rx / DC Orders ED Discharge Orders     None        Alden HippMurphy, Brittiney Dicostanzo A, PA-C 08/31/20 1752    Cheryll CockayneHong, Joshua S, MD 09/05/20 1510

## 2020-08-31 NOTE — H&P (Signed)
History and Physical    AUL MANGIERI IWL:[id number] DOB: 1945/03/07 DOA: 08/31/2020  PCP: Sherron Monday, MD   Patient coming from: Home  I have personally briefly reviewed patient's old medical records in [facility]  Chief Complaint: Syncope  HPI: Gregory [name] is a 75 y.o. male with medical history significant for coronary artery disease, hypertension, gout. Patient was brought to the ED via EMS reports of passing out.  Patient's daughter is at bedside, she was present with patient when this happened and she assisted with the history. She reports today patient was sitting outside in the street and admitted 3.  Patient was out for about 45 minutes when patient passed out.  She reports patient's face appeared blue.  Patient was out for about the second or so.  She reports this happened 3 times.  Before patient passed out patient reported his whole body ache.  Denies prior dizziness, no chest pain no difficulty breathing.  No vomiting no loose stools, he has maintained his oral intake at his baseline.  No prior episodes of passing out.  No drinking no seizure activity witnessed. Patient is on antihypertensives and Lasix, last took his medications last night.  Patient also reports right low back pain radiating down to his right lower extremity ongoing for 3 to 4 months and unchanged.  He denies abdominal pain.  On EMS arrival, patient was noted to be hypotensive, diaphoretic.  Daughter does not know blood pressure that was recorded by EMS.  Patient was given 500 mill bolus.  ED Course: Temperature 98.8.  Heart rate 52-77.  Blood pressure in the 90s on arrival to the ED, 500 mill bolus given, improvement in blood pressure to 120/63. Troponin 21 > 22.  COVID test negative.  EKG without significant abnormalities.  Portable chest x-ray unremarkable.  Right lower extremity venous Dopplers negative for DVT (had been planned as an outpatient procedure, so was done in ED).  Hospitalist  to  admit for syncope.   Review of Systems: As per HPI all other systems reviewed and negative.  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   Pneumothorax    Associated with pneumonia    Past Surgical History:  Procedure Laterality Date   Arm surgery     CARDIAC CATHETERIZATION N/A 12/05/2014   Procedure: Left Heart Cath and Coronary Angiography;  Surgeon: Lyn Records, MD;  Location: [facility] INVASIVE CV LAB;  Service: Cardiovascular;  Laterality: N/A;   CATARACT EXTRACTION W/PHACO Right 01/14/2013   Procedure: RIGHT EYE CATARACT EXTRACTION PHACO AND INTRAOCULAR LENS PLACEMENT ;  Surgeon: Gemma Payor, MD;  Location: [facility] ORS;  Service: Ophthalmology;  Laterality: Right;  CDE 54.97   CATARACT EXTRACTION W/PHACO Left 02/18/2013   Procedure: CATARACT EXTRACTION PHACO AND INTRAOCULAR LENS PLACEMENT (IOC);  Surgeon: Gemma Payor, MD;  Location: [facility] ORS;  Service: Ophthalmology;  Laterality: Left;  CDE 34.02   COLONOSCOPY WITH PROPOFOL N/A 07/13/2016   Procedure: COLONOSCOPY WITH PROPOFOL;  Surgeon: Scot Jun, MD;  Location: [facility] ENDOSCOPY;  Service: Endoscopy;  Laterality: N/A;   INGUINAL HERNIA REPAIR Right 09/09/2012   Procedure: HERNIA REPAIR INGUINAL INCARCERATED;  Surgeon: Kandis Cocking, MD;  Location: [facility] ORS;  Service: General;  Laterality: Right;   INSERTION OF MESH Right 09/09/2012   Procedure: INSERTION OF MESH;  Surgeon: Kandis Cocking, MD;  Location: [facility]S;  Service: General;  Laterality: Right;     reports that he quit smoking about 42 years ago. His smoking use included cigarettes. He has never used smokeless tobacco. He reports that he does not drink alcohol and does not use drugs.  No Known Allergies  Family History  Problem Relation Age of Onset   COPD Father    Stroke Brother    Prior to Admission medications   Medication Sig Start Date End Date Taking? Authorizing Provider   amLODipine (NORVASC) 10 MG tablet Take 10 mg by mouth daily.   Yes [provider]  aspirin 81 MG chewable tablet Chew 1 tablet (81 mg total) by mouth daily. 12/06/14  Yes Kilroy, Luke K, PA-C  BREZTRI AEROSPHERE 160-9-4.8 MCG/ACT AERO Inhale 2 puffs into the lungs 2 (two) times daily. 08/17/20  Yes [provider]  celecoxib (CELEBREX) 200 MG capsule Take 200-400 mg by mouth daily as needed. 06/29/20  Yes [provider]  clopidogrel (PLAVIX) 75 MG tablet TAKE ONE TABLET BY MOUTH ONCE DAILY   **NEEDS OFFICE VISIT** Patient taking differently: Take 75 mg by mouth daily. 03/15/16  Yes Kilroy, Luke K, PA-C  diclofenac Sodium (VOLTAREN) 1 % GEL Apply 4 g topically 4 (four) times daily. 08/01/20  Yes Melene PlanFloyd, Dan, DO  fluticasone (FLONASE) 50 MCG/ACT nasal spray Place 1 spray into both nostrils 2 (two) times daily. 11/18/14  Yes [provider]  Fluticasone Propionate, Inhal, (FLOVENT DISKUS) 100 MCG/BLIST AEPB Inhale into the lungs.   Yes [provider]  furosemide (LASIX) 20 MG tablet Take 20 mg by mouth daily.   Yes [provider]  isosorbide mononitrate (IMDUR) 30 MG 24 hr tablet Take 1 tablet (30 mg total) by mouth daily. 12/06/14  Yes Kilroy, Luke K, PA-C  lisinopril (PRINIVIL,ZESTRIL) 40 MG tablet Take 40 mg by mouth daily.   Yes [provider]  metoprolol tartrate (LOPRESSOR) 25 MG tablet TAKE ONE-HALF TABLET BY MOUTH TWICE DAILY Patient taking differently: Take 12.5 mg by mouth 2 (two) times daily. 11/26/15  Yes Jodelle GrossLawrence, Kathryn M, NP  nitroGLYCERIN (NITROSTAT) 0.4 MG SL tablet Place 1 tablet (0.4 mg total) under the tongue every 5 (five) minutes x 3 doses as needed for chest pain. 12/06/14  Yes Kilroy, Luke K, PA-C  rosuvastatin (CRESTOR) 40 MG tablet Take 40 mg by mouth daily. 06/29/20  Yes [provider]  spironolactone (ALDACTONE) 25 MG tablet Take 25 mg by mouth daily. 08/10/20  Yes [provider]  traZODone  (DESYREL) 50 MG tablet Take 50 mg by mouth at bedtime.   Yes [provider]  atorvastatin (LIPITOR) 20 MG tablet Take 20 mg by mouth daily.  Patient not taking: Reported on 08/31/2020    [provider]  budesonide-formoterol (SYMBICORT) 160-4.5 MCG/ACT inhaler Inhale 2 puffs into the lungs 2 (two) times daily. Patient not taking: Reported on 08/31/2020    [provider]  cetirizine (ZYRTEC) 10 MG tablet Take by mouth. Patient not taking: No sig reported    [provider]    Physical Exam: Vitals:   08/31/20 1530 08/31/20 1600 08/31/20 1630 08/31/20 1700  BP: 112/65 (!) 108/56 113/62 122/60  Pulse: (!) 54 67 (!) 58 77  Resp: 19 (!) 21 (!) 21 (!) 25  Temp:      TempSrc:      SpO2: 98% 97% 97% 98%  Weight:      Height:        Constitutional: NAD, calm, comfortable Vitals:  08/31/20 1530 08/31/20 1600 08/31/20 1630 08/31/20 1700  BP: 112/65 (!) 108/56 113/62 122/60  Pulse: (!) 54 67 (!) 58 77  Resp: 19 (!) 21 (!) 21 (!) 25  Temp:      TempSrc:      SpO2: 98% 97% 97% 98%  Weight:      Height:       Eyes: PERRL, lids and conjunctivae normal ENMT: Mucous membranes are moist.   Neck: normal, supple, no masses, no thyromegaly Respiratory: clear to auscultation bilaterally, no wheezing, no crackles. Normal respiratory effort. No accessory muscle use.  Cardiovascular: Regular rate and rhythm, no murmurs / rubs / gallops. No extremity edema. 2+ pedal pulses.  Abdomen: no tenderness, no masses palpated. No hepatosplenomegaly. Bowel sounds positive.  Musculoskeletal: no clubbing / cyanosis. No joint deformity upper and lower extremities. Good ROM, no contractures. Normal muscle tone.  Skin: no rashes, lesions, ulcers. No induration Neurologic: No apparent cranial abnormality, moving extremities spontaneously. Psychiatric: Normal judgment and insight. Alert and oriented x 3. Normal mood.   Labs on Admission: I have personally reviewed following labs  and imaging studies  CBC: Recent Labs  Lab 08/31/20 1340  WBC 11.1*  NEUTROABS 8.2*  HGB 14.2  HCT 43.5  MCV 92.8  PLT 270   Basic Metabolic Panel: Recent Labs  Lab 08/31/20 1340  NA 135  K 4.5  CL 110  CO2 20*  GLUCOSE 110*  BUN 24*  CREATININE 1.68*  CALCIUM 8.9    Liver Function Tests: Recent Labs  Lab 08/31/20 1340  AST 25  ALT 18  ALKPHOS 40  BILITOT 1.3*  PROT 7.6  ALBUMIN 4.2    Radiological Exams on Admission: US Venous Img Lower Right (DVT Study)  Result Date: 08/31/2020 CLINICAL DATA:  Right lower extremity pain. EXAM: RIGHT LOWER EXTREMITY VENOUS DOPPLER ULTRASOUND TECHNIQUE: Gray-scale sonography with graded compression, as well as color Doppler and duplex ultrasound were performed to evaluate the lower extremity deep venous systems from the level of the common femoral vein and including the common femoral, femoral, profunda femoral, popliteal and calf veins including the posterior tibial, peroneal and gastrocnemius veins when visible. The superficial great saphenous vein was also interrogated. Spectral Doppler was utilized to evaluate flow at rest and with distal augmentation maneuvers in the common femoral, femoral and popliteal veins. COMPARISON:  None. FINDINGS: Contralateral Common Femoral Vein: Respiratory phasicity is normal and symmetric with the symptomatic side. No evidence of thrombus. Normal compressibility. Common Femoral Vein: No evidence of thrombus. Normal compressibility, respiratory phasicity and response to augmentation. Saphenofemoral Junction: No evidence of thrombus. Normal compressibility and flow on color Doppler imaging. Profunda Femoral Vein: No evidence of thrombus. Normal compressibility and flow on color Doppler imaging. Femoral Vein: No evidence of thrombus. Normal compressibility, respiratory phasicity and response to augmentation. Popliteal Vein: No evidence of thrombus. Normal compressibility, respiratory phasicity and response to  augmentation. Calf Veins: No evidence of thrombus. Normal compressibility and flow on color Doppler imaging. Superficial Great Saphenous Vein: No evidence of thrombus. Normal compressibility. Venous Reflux:  None. Other Findings:  None. IMPRESSION: Negative for deep venous thrombosis in right lower extremity. Electronically Signed   By: Richarda Overlie M.D.   On: 08/31/2020 17:22   DG Chest Port 1 View  Result Date: 08/31/2020 CLINICAL DATA:  Near syncope EXAM: PORTABLE CHEST 1 VIEW COMPARISON:  Chest radiograph 03/07/2015 FINDINGS: The heart is mildly enlarged, unchanged. The mediastinal contours are within normal limits. There is slight asymmetric elevation of the  right hemidiaphragm, unchanged. There is no focal consolidation or pulmonary edema. There is no pleural effusion or pneumothorax. There is degenerative change of both shoulders. There is no acute osseous abnormality. IMPRESSION: Unchanged mild cardiomegaly. Otherwise, no radiographic evidence of acute cardiopulmonary process. Electronically Signed   By: Lesia Hausen MD   On: 08/31/2020 14:30    EKG: Independently reviewed.  Sinus rhythm, rate 54.  QTc 440.  No significant change from prior.  Assessment/Plan Principal Problem:   Syncope Active Problems:   CAD, multiple vessel   Dyslipidemia   Essential hypertension   Syncope- at this time etiology likely from hypotension, blood pressure on arrival to the ED down to 92/68,  after 500 mill bolus.  Orthostatic vitals in ED negative.  Likely from antihypertensives, and Lasix 20 mg daily.  Troponins unremarkable.  EKG unchanged.  Reports having an echo done a week ago, I do not see this in epic or Care Everywhere. -Check magnesium, trend troponin -Obtain echocardiogram - So far, total 1 L bolus given, continue N/s 100cc/hr x 15hrs -Hold home antihypertensives- Norvasc 10 mg, Imdur 30 mg, lisinopril 40 mg, metoprolol 5.5, and Lasix 20 mg, spironolactone 25 mg daily. -monitor on  telemetry  Likely AKI- mild, creatinine 1.68, baseline 0.8-1.3 last checked 2018.  Likely secondary to hypotension, antihypertensives. -IV fluids -Hold antihypertensives for now including lisinopril.  Coronary artery disease-stable.  Troponins EKG unremarkable.  Cardiac cath 2016 showed total occlusion of apical LAD, also stenosis involving other arteries.  Follow with a cardiologist at [facility]  No details in care everywhere. -Resume aspirin, Plavix, he reports compliance -Hold metoprolol, lisinopril, Imdur, Lasix 20 mg daily for now -Resume Crestor  Hypertension-hypotensive. -Hold home Norvasc 10 mg, Imdur 30 mg, lisinopril 40 mg, metoprolol 5.5, and Lasix 20 mg, spironolactone 25 mg daily.  DVT prophylaxis: Lovenox Code Status: Full code Family Communication: Daughter at bedside-Liz a Adriana Simas, who is primary decision-maker contact [phone] ( She reports her sister and patient's other daughter - Irving Burton is currently in the ED and is being admitted).  Per patient's syncopal episodes occurred before he knew about his daughter. Disposition Plan: ~  1- 2 days Consults called: None Admission status: Obs tele    Onnie Boer MD [facility]  08/31/2020, 9:15 PM

## 2020-09-01 ENCOUNTER — Observation Stay (HOSPITAL_BASED_OUTPATIENT_CLINIC_OR_DEPARTMENT_OTHER): Payer: Medicare Other

## 2020-09-01 DIAGNOSIS — I2510 Atherosclerotic heart disease of native coronary artery without angina pectoris: Secondary | ICD-10-CM | POA: Diagnosis not present

## 2020-09-01 DIAGNOSIS — R55 Syncope and collapse: Principal | ICD-10-CM

## 2020-09-01 DIAGNOSIS — I10 Essential (primary) hypertension: Secondary | ICD-10-CM

## 2020-09-01 DIAGNOSIS — E785 Hyperlipidemia, unspecified: Secondary | ICD-10-CM | POA: Diagnosis not present

## 2020-09-01 DIAGNOSIS — I959 Hypotension, unspecified: Secondary | ICD-10-CM | POA: Diagnosis not present

## 2020-09-01 LAB — ECHOCARDIOGRAM COMPLETE
AR max vel: 2.93 cm2
AV Area VTI: 2.64 cm2
AV Area mean vel: 2.72 cm2
AV Mean grad: 4 mmHg
AV Peak grad: 6.7 mmHg
Ao pk vel: 1.29 m/s
Area-P 1/2: 3.54 cm2
Height: 64 in
MV VTI: 3.01 cm2
S' Lateral: 2.41 cm
Weight: 3328 oz

## 2020-09-01 LAB — BASIC METABOLIC PANEL
Anion gap: 5 (ref 5–15)
BUN: 21 mg/dL (ref 8–23)
CO2: 22 mmol/L (ref 22–32)
Calcium: 9 mg/dL (ref 8.9–10.3)
Chloride: 111 mmol/L (ref 98–111)
Creatinine, Ser: 1.39 mg/dL — ABNORMAL HIGH (ref 0.61–1.24)
GFR, Estimated: 54 mL/min — ABNORMAL LOW (ref >60)
Glucose, Bld: 98 mg/dL (ref 70–99)
Potassium: 4.2 mmol/L (ref 3.5–5.1)
Sodium: 138 mmol/L (ref 135–145)

## 2020-09-01 LAB — CBC
HCT: 38.5 % — ABNORMAL LOW (ref 39.0–52.0)
Hemoglobin: 12.7 g/dL — ABNORMAL LOW (ref 13.0–17.0)
MCH: 30.7 pg (ref 26.0–34.0)
MCHC: 33 g/dL (ref 30.0–36.0)
MCV: 93 fL (ref 80.0–100.0)
Platelets: 244 10*3/uL (ref 150–400)
RBC: 4.14 MIL/uL — ABNORMAL LOW (ref 4.22–5.81)
RDW: 14.6 % (ref 11.5–15.5)
WBC: 9.5 10*3/uL (ref 4.0–10.5)
nRBC: 0 % (ref 0.0–0.2)

## 2020-09-01 LAB — D-DIMER, QUANTITATIVE: D-Dimer, Quant: 0.63 ug/mL-FEU — ABNORMAL HIGH (ref 0.00–0.50)

## 2020-09-01 NOTE — Progress Notes (Addendum)
[facility] PROGRESS NOTE   Gregory BUELOW GGE:[id number] DOB: 1945/12/15 DOA: 08/31/2020  PCP: Sherron Monday, MD  Brief History/Interval Summary: 75 year old Caucasian male with a past medical history of coronary artery disease, essential hypertension, gout presented to the ED with complaints of passing out episode.  He was apparently sitting on a chair under a tree when he suddenly passed out.  Did not have any premonition prior to this episode.  No other symptoms prior to this episode.  Denies any chest pain shortness of breath nausea or vomiting.  It was not hot when he was outside.  Denies any recent GI illness.   Consultants: Cardiology has been consulted  Procedures: Transthoracic echocardiogram is pending  Antibiotics: Anti-infectives (From admission, onward)    None       Subjective/Interval History: Patient denies any chest pain shortness of breath currently.  He denies any seizure activity with this episode.  His daughter was with him when this happened.  No acute illness recently.    Assessment/Plan:  Syncope History is suspicious for cardiac arrhythmia.  Telemetry shows shows certain artifacts which have been read as nonsustained VT but there is 1 particular rhythm strips which is suspicious for nonsustained ventricular tachycardia.  EKG does not show prolonged QT.  His blood pressure was low when he was evaluated by EMS.  No recent changes to his antihypertensives or cardiac medications.  Patient was given fluid boluses.  His antihypertensives were held.  Blood pressure remains on the lower side.  Echocardiogram is pending.  Lower extremity Doppler study was negative for DVT.  We will do a D-dimer.  If elevated we may consider doing a CT angiogram as well.  Cardiology has been consulted to assist with management.  He is followed by a cardiologist in [place], Dr. Welton Flakes. Start mobilizing.  ADDENDUM: D-dimer noted to be 0.63.  When adjusted for age this  falls below the cutoff of 0.72.  PE is thought to be low probability in this patient to begin with.  Will not initiate further work-up at this time.  Mild acute kidney injury Creatinine was 1.68 at presentation.  Baseline is normal.  Given IV fluids with improvement.  Avoid nephrotoxic agents.  His ACE inhibitor and Lasix are on hold.  History of coronary artery disease Follows with cardiology in [place], Dr. Welton Flakes.  Cardiac cath from 2016 showed total occlusion of LAD.  He is on aspirin and Plavix.  Also on statin.  Also on beta-blocker and ACE inhibitor which are currently on hold.  Patient is also noted to be on Imdur. Mildly abnormal troponin levels of unclear significance at this time considering no chest pain.  Hypotension all in a patient with history of essential hypertension As mentioned above his blood pressure noted to be low so his antihypertensives are on hold.  Obesity Estimated body mass index is 35.7 kg/m as calculated from the following:   Height as of this encounter: 5\' 4"  (1.626 m).   Weight as of this encounter: 94.3 kg.    DVT Prophylaxis: Lovenox Code Status: Full code Family Communication: Discussed with the patient Disposition Plan: Unclear for now but probably return home when improved  Status is: Observation  The patient may require care spanning > 2 midnights and if so he can be moved to inpatient because: Ongoing diagnostic testing needed not appropriate for outpatient work up  Dispo: The patient is from: Home  Anticipated d/c is to: Home              Patient currently is not medically stable to d/c.   Difficult to place patient No    Medications: Scheduled:  aspirin  81 mg Oral Daily   clopidogrel  75 mg Oral Daily   enoxaparin (LOVENOX) injection  40 mg Subcutaneous Q24H   fluticasone furoate-vilanterol  1 puff Inhalation Daily   rosuvastatin  40 mg Oral Daily   traZODone  50 mg Oral QHS   Continuous:  sodium chloride 100 mL/hr  at 08/31/20 2114   DGL:OVFIEPPIRJJOA **OR** acetaminophen, ondansetron **OR** ondansetron (ZOFRAN) IV, polyethylene glycol   Objective:  Vital Signs  Vitals:   09/01/20 0400 09/01/20 0615 09/01/20 0700 09/01/20 0730  BP: 104/61 (!) 97/58 (!) 111/55 106/86  Pulse: (!) 50 (!) 51 (!) 58 65  Resp: 16 16 18 20   Temp:      TempSrc:      SpO2: 94% 90% 95% 99%  Weight:      Height:        Intake/Output Summary (Last 24 hours) at 09/01/2020 0858 Last data filed at 08/31/2020 1634 Gross per 24 hour  Intake 500 ml  Output --  Net 500 ml   Filed Weights   08/31/20 1258  Weight: 94.3 kg    General appearance: Awake alert.  In no distress Resp: Clear to auscultation bilaterally.  Normal effort Cardio: S1-S2 is normal regular.  No S3-S4.  No rubs murmurs or bruit GI: Abdomen is soft.  Nontender nondistended.  Bowel sounds are present normal.  No masses organomegaly Extremities: No edema.  Full range of motion of lower extremities. Neurologic: Alert and oriented x3.  No focal neurological deficits.    Lab Results:  Data Reviewed: I have personally reviewed following labs and imaging studies  CBC: Recent Labs  Lab 08/31/20 1340 09/01/20 0421  WBC 11.1* 9.5  NEUTROABS 8.2*  --   HGB 14.2 12.7*  HCT 43.5 38.5*  MCV 92.8 93.0  PLT 270 244    Basic Metabolic Panel: Recent Labs  Lab 08/31/20 1340 08/31/20 2108 09/01/20 0421  NA 135  --  138  K 4.5  --  4.2  CL 110  --  111  CO2 20*  --  22  GLUCOSE 110*  --  98  BUN 24*  --  21  CREATININE 1.68*  --  1.39*  CALCIUM 8.9  --  9.0  MG  --  2.1  --     GFR: Estimated Creatinine Clearance: 49.7 mL/min (A) (by C-G formula based on SCr of 1.39 mg/dL (H)).  Liver Function Tests: Recent Labs  Lab 08/31/20 1340  AST 25  ALT 18  ALKPHOS 40  BILITOT 1.3*  PROT 7.6  ALBUMIN 4.2     Recent Results (from the past 240 hour(s))  Resp Panel by RT-PCR (Flu A&B, Covid) Nasopharyngeal Swab     Status: None   Collection  Time: 08/31/20  4:23 PM   Specimen: Nasopharyngeal Swab; Nasopharyngeal(NP) swabs in vial transport medium  Result Value Ref Range Status   SARS Coronavirus 2 by RT PCR NEGATIVE NEGATIVE Final    Comment: (NOTE) SARS-CoV-2 target nucleic acids are NOT DETECTED.  The SARS-CoV-2 RNA is generally detectable in upper respiratory specimens during the acute phase of infection. The lowest concentration of SARS-CoV-2 viral copies this assay can detect is 138 copies/mL. A negative result does not preclude SARS-Cov-2 infection and should not be used  as the sole basis for treatment or other patient management decisions. A negative result may occur with  improper specimen collection/handling, submission of specimen other than nasopharyngeal swab, presence of viral mutation(s) within the areas targeted by this assay, and inadequate number of viral copies(<138 copies/mL). A negative result must be combined with clinical observations, patient history, and epidemiological information. The expected result is Negative.  Fact Sheet for Patients:  BloggerCourse.comhttps://www.fda.gov/media/152166/download  Fact Sheet for Healthcare Providers:  SeriousBroker.ithttps://www.fda.gov/media/152162/download  This test is no t yet approved or cleared by the [place] FDA and  has been authorized for detection and/or diagnosis of SARS-CoV-2 by FDA under an Emergency Use Authorization (EUA). This EUA will remain  in effect (meaning this test can be used) for the duration of the COVID-19 declaration under Section 564(b)(1) of the Act, 21 U.S.C.section 360bbb-3(b)(1), unless the authorization is terminated  or revoked sooner.       Influenza A by PCR NEGATIVE NEGATIVE Final   Influenza B by PCR NEGATIVE NEGATIVE Final    Comment: (NOTE) The Xpert Xpress SARS-CoV-2/FLU/RSV plus assay is intended as an aid in the diagnosis of influenza from Nasopharyngeal swab specimens and should not be used as a sole basis for treatment. Nasal washings  and aspirates are unacceptable for Xpert Xpress SARS-CoV-2/FLU/RSV testing.  Fact Sheet for Patients: BloggerCourse.comhttps://www.fda.gov/media/152166/download  Fact Sheet for Healthcare Providers: SeriousBroker.ithttps://www.fda.gov/media/152162/download  This test is not yet approved or cleared by the [place] FDA and has been authorized for detection and/or diagnosis of SARS-CoV-2 by FDA under an Emergency Use Authorization (EUA). This EUA will remain in effect (meaning this test can be used) for the duration of the COVID-19 declaration under Section 564(b)(1) of the Act, 21 U.S.C. section 360bbb-3(b)(1), unless the authorization is terminated or revoked.  Performed at [facility], [address] [place], [place] [address]       Radiology Studies: US Venous Img Lower Right (DVT Study)  Result Date: 08/31/2020 CLINICAL DATA:  Right lower extremity pain. EXAM: RIGHT LOWER EXTREMITY VENOUS DOPPLER ULTRASOUND TECHNIQUE: Gray-scale sonography with graded compression, as well as color Doppler and duplex ultrasound were performed to evaluate the lower extremity deep venous systems from the level of the common femoral vein and including the common femoral, femoral, profunda femoral, popliteal and calf veins including the posterior tibial, peroneal and gastrocnemius veins when visible. The superficial great saphenous vein was also interrogated. Spectral Doppler was utilized to evaluate flow at rest and with distal augmentation maneuvers in the common femoral, femoral and popliteal veins. COMPARISON:  None. FINDINGS: Contralateral Common Femoral Vein: Respiratory phasicity is normal and symmetric with the symptomatic side. No evidence of thrombus. Normal compressibility. Common Femoral Vein: No evidence of thrombus. Normal compressibility, respiratory phasicity and response to augmentation. Saphenofemoral Junction: No evidence of thrombus. Normal compressibility and flow on color Doppler imaging. Profunda Femoral Vein: No  evidence of thrombus. Normal compressibility and flow on color Doppler imaging. Femoral Vein: No evidence of thrombus. Normal compressibility, respiratory phasicity and response to augmentation. Popliteal Vein: No evidence of thrombus. Normal compressibility, respiratory phasicity and response to augmentation. Calf Veins: No evidence of thrombus. Normal compressibility and flow on color Doppler imaging. Superficial Great Saphenous Vein: No evidence of thrombus. Normal compressibility. Venous Reflux:  None. Other Findings:  None. IMPRESSION: Negative for deep venous thrombosis in right lower extremity. Electronically Signed   By: Richarda OverlieAdam  Henn M.D.   On: 08/31/2020 17:22   DG Chest Port 1 View  Result Date: 08/31/2020 CLINICAL DATA:  Near  syncope EXAM: PORTABLE CHEST 1 VIEW COMPARISON:  Chest radiograph 03/07/2015 FINDINGS: The heart is mildly enlarged, unchanged. The mediastinal contours are within normal limits. There is slight asymmetric elevation of the right hemidiaphragm, unchanged. There is no focal consolidation or pulmonary edema. There is no pleural effusion or pneumothorax. There is degenerative change of both shoulders. There is no acute osseous abnormality. IMPRESSION: Unchanged mild cardiomegaly. Otherwise, no radiographic evidence of acute cardiopulmonary process. Electronically Signed   By: Lesia Hausen MD   On: 08/31/2020 14:30       LOS: 0 days   Osvaldo Shipper  [facility] Pager on [email or web address]  09/01/2020, 8:58 AM

## 2020-09-01 NOTE — Care Management Obs Status (Signed)
MEDICARE OBSERVATION STATUS NOTIFICATION   Patient Details  Name: Gregory [name] MRN: [id number] Date of Birth: [birth year]   Medicare Observation Status Notification Given:  Yes    Corey Harold 09/01/2020, 3:37 PM

## 2020-09-01 NOTE — Progress Notes (Signed)
  Echocardiogram 2D Echocardiogram has been performed.  Gregory [name] 09/01/2020, 10:29 AM

## 2020-09-01 NOTE — Consult Note (Addendum)
Cardiology Consultation:   [name] ID: Gregory [name] MRN: [id number]; DOB: 27-Sep-1945  Admit date: 08/31/2020 Date of Consult: 09/01/2020  PCP:  Sherron Monday, MD   [facility] Providers cardiologist Dr. Lynn Ito Cardiologist:  Laurier Nancy, MD        [name] Profile:   Gregory [name] is a 75 y.o. male with a hx of CAD who is being seen 09/01/2020 for the evaluation of syncope at the request of Dr. Rito Ehrlich.  History of Present Illness:   Gregory [name] is a 75 yo male [name] with hx CAD S/P NSTEMI 2016 with total occlusion of apical LAD, mod diffuse disease prox to total occlusion, branch of Dx1 90%, too small to intervene, eccentric 50-80%, high grade stenosis continuation of RCA before origin small LV branch, low normal LV with apical mod HK. Echo 12/05/14 normal LVEF 60-65% grade 1 DD. Also has HTN, HLD. Only f/u once in our office after that.   [name] was sitting outside with his daugter for about 45 min yest when he passed out-was out for a second or 2 and happened 3 times. EMS found him to be hypotensive and diaphoretic, given 500 mill bolus fluids. Not orthostatic in ED. Is on lasix 20 mg daily and norvasc 10 mg, imdur 30 mg, lisinopril 40 mg, metoprolol 12.5 mg bid, and spironolactone 25 mg daily, Plavix and ASA.   [name] has been fasting for about a year and lost 28 lbs. Doesn't eat all day until 6:30 pm except for some cantaloupe and oatmeal cookies. He had been followed by cardiologist Dr. Welton Flakes in [place]. Has complained of right hip down to his leg pain. Had stress test and echo last week and is supposed to have a "dye test" to look for a blockage 09/08/20. Denies chest pain, palpitations, dizziness or presyncope prior to this. Not active, not diabetic, compliant with meds. Occasional ankle swelling but not recently.    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    Pneumothorax    Associated with pneumonia    Past Surgical History:  Procedure Laterality Date   Arm surgery     CARDIAC CATHETERIZATION N/A 12/05/2014   Procedure: Left Heart Cath and Coronary Angiography;  Surgeon: Lyn Records, MD;  Location: [facility] INVASIVE CV LAB;  Service: Cardiovascular;  Laterality: N/A;   CATARACT EXTRACTION W/PHACO Right 01/14/2013   Procedure: RIGHT EYE CATARACT EXTRACTION PHACO AND INTRAOCULAR LENS PLACEMENT ;  Surgeon: Gemma Payor, MD;  Location: [facility] ORS;  Service: Ophthalmology;  Laterality: Right;  CDE 54.97   CATARACT EXTRACTION W/PHACO Left 02/18/2013   Procedure: CATARACT EXTRACTION PHACO AND INTRAOCULAR LENS PLACEMENT (IOC);  Surgeon: Gemma Payor, MD;  Location: [facility] ORS;  Service: Ophthalmology;  Laterality: Left;  CDE 34.02   COLONOSCOPY WITH PROPOFOL N/A 07/13/2016   Procedure: COLONOSCOPY WITH PROPOFOL;  Surgeon: Scot Jun, MD;  Location: [facility] ENDOSCOPY;  Service: Endoscopy;  Laterality: N/A;   INGUINAL HERNIA REPAIR Right 09/09/2012   Procedure: HERNIA REPAIR INGUINAL INCARCERATED;  Surgeon: Kandis Cocking, MD;  Location: [facility] ORS;  Service: General;  Laterality: Right;   INSERTION OF MESH Right 09/09/2012   Procedure: INSERTION OF MESH;  Surgeon: Kandis Cocking, MD;  Location: [facility] ORS;  Service: General;  Laterality: Right;     Home Medications:  Prior to Admission medications   Medication Sig Start Date End Date  Taking? Authorizing Provider  amLODipine (NORVASC) 10 MG tablet Take 10 mg by mouth daily.   Yes [provider]  aspirin 81 MG chewable tablet Chew 1 tablet (81 mg total) by mouth daily. 12/06/14  Yes Kilroy, Luke K, PA-C  BREZTRI AEROSPHERE 160-9-4.8 MCG/ACT AERO Inhale 2 puffs into the lungs 2 (two) times daily. 08/17/20  Yes [provider]  celecoxib (CELEBREX) 200 MG capsule Take 200-400 mg by mouth daily as needed. 06/29/20  Yes [provider]  clopidogrel (PLAVIX) 75 MG tablet TAKE ONE TABLET BY MOUTH ONCE DAILY    **NEEDS OFFICE VISIT** [name] taking differently: Take 75 mg by mouth daily. 03/15/16  Yes Kilroy, Luke K, PA-C  diclofenac Sodium (VOLTAREN) 1 % GEL Apply 4 g topically 4 (four) times daily. 08/01/20  Yes Melene Plan, DO  fluticasone (FLONASE) 50 MCG/ACT nasal spray Place 1 spray into both nostrils 2 (two) times daily. 11/18/14  Yes [provider]  Fluticasone Propionate, Inhal, (FLOVENT DISKUS) 100 MCG/BLIST AEPB Inhale into the lungs.   Yes [provider]  furosemide (LASIX) 20 MG tablet Take 20 mg by mouth daily.   Yes [provider]  isosorbide mononitrate (IMDUR) 30 MG 24 hr tablet Take 1 tablet (30 mg total) by mouth daily. 12/06/14  Yes Kilroy, Luke K, PA-C  lisinopril (PRINIVIL,ZESTRIL) 40 MG tablet Take 40 mg by mouth daily.   Yes [provider]  metoprolol tartrate (LOPRESSOR) 25 MG tablet TAKE ONE-HALF TABLET BY MOUTH TWICE DAILY [name] taking differently: Take 12.5 mg by mouth 2 (two) times daily. 11/26/15  Yes Jodelle Gross, NP  nitroGLYCERIN (NITROSTAT) 0.4 MG SL tablet Place 1 tablet (0.4 mg total) under the tongue every 5 (five) minutes x 3 doses as needed for chest pain. 12/06/14  Yes Kilroy, Luke K, PA-C  rosuvastatin (CRESTOR) 40 MG tablet Take 40 mg by mouth daily. 06/29/20  Yes [provider]  spironolactone (ALDACTONE) 25 MG tablet Take 25 mg by mouth daily. 08/10/20  Yes [provider]  traZODone (DESYREL) 50 MG tablet Take 50 mg by mouth at bedtime.   Yes [provider]  atorvastatin (LIPITOR) 20 MG tablet Take 20 mg by mouth daily.  [name] not taking: Reported on 08/31/2020    [provider]  budesonide-formoterol (SYMBICORT) 160-4.5 MCG/ACT inhaler Inhale 2 puffs into the lungs 2 (two) times daily. [name] not taking: Reported on 08/31/2020    [provider]  cetirizine (ZYRTEC) 10 MG tablet Take by mouth. [name] not taking: No sig reported    [provider]    Inpatient  Medications: Scheduled Meds:  aspirin  81 mg Oral Daily   clopidogrel  75 mg Oral Daily   enoxaparin (LOVENOX) injection  40 mg Subcutaneous Q24H   fluticasone furoate-vilanterol  1 puff Inhalation Daily   rosuvastatin  40 mg Oral Daily   traZODone  50 mg Oral QHS   Continuous Infusions:  sodium chloride 100 mL/hr at 08/31/20 2114   PRN Meds: acetaminophen **OR** acetaminophen, ondansetron **OR** ondansetron (ZOFRAN) IV, polyethylene glycol  Allergies:   No Known Allergies  Social History:   Social History   Socioeconomic History   Marital status: Married    Spouse name: Not on file   Number of children: Not on file   Years of education: Not on file   Highest education level: Not on file  Occupational History   Not on file  Tobacco Use   Smoking status: Former    Types:  Cigarettes    Quit date: 01/28/1978    Years since quitting: 42.6   Smokeless tobacco: Never  Vaping Use   Vaping Use: Never used  Substance and Sexual Activity   Alcohol use: No    Alcohol/week: 0.0 standard drinks   Drug use: No   Sexual activity: Not on file  Other Topics Concern   Not on file  Social History Narrative   Not on file   Social Determinants of Health   Financial Resource Strain: Not on file  Food Insecurity: Not on file  Transportation Needs: Not on file  Physical Activity: Not on file  Stress: Not on file  Social Connections: Not on file  Intimate Partner Violence: Not on file    Family History:     Family History  Problem Relation Age of Onset   COPD Father    Stroke Brother      ROS:  Please see the history of present illness.  Review of Systems  Constitutional: Negative.  HENT: Negative.    Cardiovascular:  Positive for claudication, leg swelling and syncope.  Respiratory: Negative.    Endocrine: Negative.   Hematologic/Lymphatic: Negative.   Musculoskeletal: Negative.   Gastrointestinal: Negative.   Genitourinary: Negative.    All other ROS reviewed and  negative.     Physical Exam/Data:   Vitals:   09/01/20 0400 09/01/20 0615 09/01/20 0700 09/01/20 0730  BP: 104/61 (!) 97/58 (!) 111/55 106/86  Pulse: (!) 50 (!) 51 (!) 58 65  Resp: 16 16 18 20   Temp:      TempSrc:      SpO2: 94% 90% 95% 99%  Weight:      Height:        Intake/Output Summary (Last 24 hours) at 09/01/2020 0941 Last data filed at 08/31/2020 1634 Gross per 24 hour  Intake 500 ml  Output --  Net 500 ml   Last 3 Weights 08/31/2020 10/20/2016 07/13/2016  Weight (lbs) 208 lb 212 lb 210 lb  Weight (kg) 94.348 kg 96.163 kg 95.255 kg     Body mass index is 35.7 kg/m.  General:  Obese, in no acute distress  HEENT: normal Lymph: no adenopathy Neck: no JVD Endocrine:  No thryomegaly Vascular: No carotid bruits; FA pulses 2+ bilaterally without bruits  Cardiac:  normal S1, S2; RRR; no murmur   Lungs:  clear to auscultation bilaterally, no wheezing, rhonchi or rales  Abd: soft, nontender, no hepatomegaly  Ext: no edema Musculoskeletal:  No deformities, BUE and BLE strength normal and equal Skin: warm and dry  Neuro:  CNs 2-12 intact, no focal abnormalities noted Psych:  Normal affect   EKG:  The EKG was personally reviewed and demonstrates:  sinus bradycardia 54/m low voltage, nonspecific ST changes Telemetry:  Telemetry was personally reviewed and demonstrates:  sinus bradycardia  Relevant CV Studies:  Cath 12/05/2014 Total occlusion of the apical LAD. This represents the culprit vessel. Moderate diffuse disease slightly proximal to the total occlusion. A branch of the first diagonal contains 90% stenosis. 2 small to intervene upon. Eccentric 50-80% distal RCA prior to the origin of the PDA. FFR negative with a value of 0.92. High-grade stenosis in the continuation of the right coronary before the origin of a small left ventricular branch. Luminal irregularities in the circumflex Low normal LV function with apical moderate hypokinesis.     Recommendations:   Dual  antiplatelet therapy for 12 months given the non-ST elevation myocardial infarction. Beta blocker therapy Lipid therapy Ambulate  and consider cardiac rehabilitation 24-48 hour discharge option exists Echo 11/2014 Study Conclusions   - Left ventricle: The cavity size was normal. Wall thickness was    normal. Systolic function was normal. The estimated ejection    fraction was in the range of 60% to 65%. Wall motion was normal;    there were no regional wall motion abnormalities. Doppler    parameters are consistent with abnormal left ventricular    relaxation (grade 1 diastolic dysfunction).  Laboratory Data:  High Sensitivity Troponin:   Recent Labs  Lab 08/31/20 1340 08/31/20 1513 08/31/20 2108  TROPONINIHS 21* 22* 21*     Chemistry Recent Labs  Lab 08/31/20 1340 09/01/20 0421  NA 135 138  K 4.5 4.2  CL 110 111  CO2 20* 22  GLUCOSE 110* 98  BUN 24* 21  CREATININE 1.68* 1.39*  CALCIUM 8.9 9.0  GFRNONAA 43* 54*  ANIONGAP 5 5    Recent Labs  Lab 08/31/20 1340  PROT 7.6  ALBUMIN 4.2  AST 25  ALT 18  ALKPHOS 40  BILITOT 1.3*   Hematology Recent Labs  Lab 08/31/20 1340 09/01/20 0421  WBC 11.1* 9.5  RBC 4.69 4.14*  HGB 14.2 12.7*  HCT 43.5 38.5*  MCV 92.8 93.0  MCH 30.3 30.7  MCHC 32.6 33.0  RDW 14.5 14.6  PLT 270 244   BNPNo results for input(s): BNP, PROBNP in the last 168 hours.  DDimer No results for input(s): DDIMER in the last 168 hours.   Radiology/Studies:  US Venous Img Lower Right (DVT Study)  Result Date: 08/31/2020 CLINICAL DATA:  Right lower extremity pain. EXAM: RIGHT LOWER EXTREMITY VENOUS DOPPLER ULTRASOUND TECHNIQUE: Gray-scale sonography with graded compression, as well as color Doppler and duplex ultrasound were performed to evaluate the lower extremity deep venous systems from the level of the common femoral vein and including the common femoral, femoral, profunda femoral, popliteal and calf veins including the posterior tibial,  peroneal and gastrocnemius veins when visible. The superficial great saphenous vein was also interrogated. Spectral Doppler was utilized to evaluate flow at rest and with distal augmentation maneuvers in the common femoral, femoral and popliteal veins. COMPARISON:  None. FINDINGS: Contralateral Common Femoral Vein: Respiratory phasicity is normal and symmetric with the symptomatic side. No evidence of thrombus. Normal compressibility. Common Femoral Vein: No evidence of thrombus. Normal compressibility, respiratory phasicity and response to augmentation. Saphenofemoral Junction: No evidence of thrombus. Normal compressibility and flow on color Doppler imaging. Profunda Femoral Vein: No evidence of thrombus. Normal compressibility and flow on color Doppler imaging. Femoral Vein: No evidence of thrombus. Normal compressibility, respiratory phasicity and response to augmentation. Popliteal Vein: No evidence of thrombus. Normal compressibility, respiratory phasicity and response to augmentation. Calf Veins: No evidence of thrombus. Normal compressibility and flow on color Doppler imaging. Superficial Great Saphenous Vein: No evidence of thrombus. Normal compressibility. Venous Reflux:  None. Other Findings:  None. IMPRESSION: Negative for deep venous thrombosis in right lower extremity. Electronically Signed   By: Richarda OverlieAdam  Henn M.D.   On: 08/31/2020 17:22   DG Chest Port 1 View  Result Date: 08/31/2020 CLINICAL DATA:  Near syncope EXAM: PORTABLE CHEST 1 VIEW COMPARISON:  Chest radiograph 03/07/2015 FINDINGS: The heart is mildly enlarged, unchanged. The mediastinal contours are within normal limits. There is slight asymmetric elevation of the right hemidiaphragm, unchanged. There is no focal consolidation or pulmonary edema. There is no pleural effusion or pneumothorax. There is degenerative change of both shoulders. There is no acute  osseous abnormality. IMPRESSION: Unchanged mild cardiomegaly. Otherwise, no  radiographic evidence of acute cardiopulmonary process. Electronically Signed   By: Lesia Hausen MD   On: 08/31/2020 14:30     Assessment and Plan:   Syncope while sitting in a chair outside-very brief(seconds) happened 3 times associated with hypotension and all over muscle aches. No chest pain, palpitations. Recent fasting and 28 lb weight loss, workup by Cardiologist in [place] last week included stress test and echo, scheduled for "dye test" to look for blockage or blood clot in leg per [name]. Will try to get records, await echo being done now. Rule out arrhythmia but could be related to fasting and weight loss. At discharge f/u with Dr. Welton Flakes in [place].  Addendum: records received from Dr. Milta Deiters office. 2Decho 08/18/20 EF 58%, grade 1 DD, normal RV, mild TR. ABI's Right 1.53, left 1.50. scheduled for CTA. NST 08/18/20 EF 89% small mild reversible mid inferolateraland basal inferolateral wall defects, normal wall motion. Possible ischemia in Cfx, normal LVEF, consider CCTA.  CAD NSTEMI 2016 with 100 LAD culprit too small for intervention, residual nonobstructive disease, see above for details-no angina still on Plavix/ASA/lipitor, no cath since 2016 followed by Dr. Lynn Ito  HTN on multiple agents as above on hold  HLD on lipitor    Signed, Gregory Reedy, PA-C  09/01/2020 9:41 AM  [name] examined chart reviewed. Admitted with vasovagal sounding event Telemetry with no arrhythmia R/O no acute ECG changes no chest pain  Echo being done Known CAD. Just had abnormal myovue with Dr Welton Flakes appears low risk with small area of mild ischemia in circumflex. He already has 3 f/u appointments with him this month including ? Cardiac CTA and CTA LE for PVD.  Preliminary echo at bedside with low normal EF apical hypokinesis known from previous cath. If no arrhythmias over night can be d/c for F/U with Dr Welton Flakes in [place]. Exam with obese male clear lungs decreased distal pulses Discussed care  with [name] and daughter at bedside  Charlton Haws MD [facility]

## 2020-09-02 DIAGNOSIS — I10 Essential (primary) hypertension: Secondary | ICD-10-CM | POA: Diagnosis not present

## 2020-09-02 DIAGNOSIS — I2510 Atherosclerotic heart disease of native coronary artery without angina pectoris: Secondary | ICD-10-CM | POA: Diagnosis not present

## 2020-09-02 LAB — BASIC METABOLIC PANEL
Anion gap: 6 (ref 5–15)
BUN: 18 mg/dL (ref 8–23)
CO2: 24 mmol/L (ref 22–32)
Calcium: 9.1 mg/dL (ref 8.9–10.3)
Chloride: 107 mmol/L (ref 98–111)
Creatinine, Ser: 1.35 mg/dL — ABNORMAL HIGH (ref 0.61–1.24)
GFR, Estimated: 56 mL/min — ABNORMAL LOW (ref >60)
Glucose, Bld: 108 mg/dL — ABNORMAL HIGH (ref 70–99)
Potassium: 4 mmol/L (ref 3.5–5.1)
Sodium: 137 mmol/L (ref 135–145)

## 2020-09-02 LAB — CBC
HCT: 40.5 % (ref 39.0–52.0)
Hemoglobin: 13.5 g/dL (ref 13.0–17.0)
MCH: 30.9 pg (ref 26.0–34.0)
MCHC: 33.3 g/dL (ref 30.0–36.0)
MCV: 92.7 fL (ref 80.0–100.0)
Platelets: 257 10*3/uL (ref 150–400)
RBC: 4.37 MIL/uL (ref 4.22–5.81)
RDW: 14.4 % (ref 11.5–15.5)
WBC: 10.2 10*3/uL (ref 4.0–10.5)
nRBC: 0 % (ref 0.0–0.2)

## 2020-09-02 NOTE — Discharge Summary (Signed)
Physician Discharge Summary  Gregory [name] QJJ:[id number] DOB: Nov 05, 1945 DOA: 08/31/2020  PCP: Sherron Monday, MD  Admit date: 08/31/2020 Discharge date: 09/02/2020  Admitted From: Home Disposition:  Home  Recommendations for Outpatient Follow-up:  Follow up with PCP in 1-2 weeks Follow up with Cardiology as scheduled  Avoid driving until cleared by your Cardiologist  Discharge Condition:Stable CODE STATUS:Full Diet recommendation: Heart healthy   Brief/Interim Summary: 75 year old Caucasian male with a past medical history of coronary artery disease, essential hypertension, gout presented to the ED with complaints of passing out episode.  He was apparently sitting on a chair under a tree when he suddenly passed out.  Did not have any premonition prior to this episode.  No other symptoms prior to this episode.  Denies any chest pain shortness of breath nausea or vomiting.  Discharge Diagnoses:  Principal Problem:   Syncope Active Problems:   CAD, multiple vessel   Dyslipidemia   Essential hypertension  Syncope History is suspicious for cardiac arrhythmia.  No recent changes to his antihypertensives or cardiac medications.  Patient was given fluid boluses.  His antihypertensives were held.  Blood pressure remains on the lower side.  Echocardiogram with wall motion corresponding to known prior heart disease.  Lower extremity Doppler study was negative for DVT.  Cardiology was consulted. Recommendation to monitor overnight and can d/c if no further events overnight. Pt to follow up with his primary cardiologist in [place], Dr. Welton Flakes.   Mild acute kidney injury Creatinine was 1.68 at presentation.  Baseline is normal.  Given IV fluids with improvement.  Cr improved  History of coronary artery disease Follows with cardiology in [place], Dr. Welton Flakes.  Cardiac cath from 2016 showed total occlusion of LAD.  He is on aspirin and Plavix.  Also on statin.  Also on ACE inhibitor which  was on hold.  Patient is also noted to be on Imdur. BP improved with low dose metoprolol resumed at time of d/c  Hypotension all in a patient with history of essential hypertension As mentioned above his blood pressure noted to be low so his antihypertensives were placed on hold. Low dose metoprolol was resumed at time of d/c   Obesity Estimated body mass index is 35.7 kg/m as calculated from the following:   Height as of this encounter: 5\' 4"  (1.626 m).   Weight as of this encounter: 94.3 kg.   Discharge Instructions   Allergies as of 09/02/2020   No Known Allergies      Medication List     STOP taking these medications    amLODipine 10 MG tablet Commonly known as: NORVASC   atorvastatin 20 MG tablet Commonly known as: LIPITOR   budesonide-formoterol 160-4.5 MCG/ACT inhaler Commonly known as: SYMBICORT   cetirizine 10 MG tablet Commonly known as: ZYRTEC   furosemide 20 MG tablet Commonly known as: LASIX   isosorbide mononitrate 30 MG 24 hr tablet Commonly known as: IMDUR   lisinopril 40 MG tablet Commonly known as: ZESTRIL   spironolactone 25 MG tablet Commonly known as: ALDACTONE       TAKE these medications    aspirin 81 MG chewable tablet Chew 1 tablet (81 mg total) by mouth daily.   Breztri Aerosphere 160-9-4.8 MCG/ACT Aero Generic drug: Budeson-Glycopyrrol-Formoterol Inhale 2 puffs into the lungs 2 (two) times daily.   celecoxib 200 MG capsule Commonly known as: CELEBREX Take 200-400 mg by mouth daily as needed.   clopidogrel 75 MG tablet Commonly known as: PLAVIX TAKE ONE  TABLET BY MOUTH ONCE DAILY   **NEEDS OFFICE VISIT** What changed: See the new instructions.   diclofenac Sodium 1 % Gel Commonly known as: VOLTAREN Apply 4 g topically 4 (four) times daily.   Flovent Diskus 100 MCG/BLIST Aepb Generic drug: Fluticasone Propionate (Inhal) Inhale into the lungs.   fluticasone 50 MCG/ACT nasal spray Commonly known as: FLONASE Place 1  spray into both nostrils 2 (two) times daily.   metoprolol tartrate 25 MG tablet Commonly known as: LOPRESSOR TAKE ONE-HALF TABLET BY MOUTH TWICE DAILY   nitroGLYCERIN 0.4 MG SL tablet Commonly known as: NITROSTAT Place 1 tablet (0.4 mg total) under the tongue every 5 (five) minutes x 3 doses as needed for chest pain.   rosuvastatin 40 MG tablet Commonly known as: CRESTOR Take 40 mg by mouth daily.   traZODone 50 MG tablet Commonly known as: DESYREL Take 50 mg by mouth at bedtime.        Follow-up Information     Laurier Nancy, MD. Schedule an appointment as soon as possible for a visit in 2 week(s).   Specialty: Cardiology Contact information: [address] [place] [address] [phone]         Sherron Monday, MD Follow up in 2 week(s).   Specialty: Internal Medicine Why: Hospital follow up Contact information: [address] [place] [address] [phone]         Laurier Nancy, MD .   Specialty: Cardiology Contact information: [address] [place] [address] [phone]                No Known Allergies  Consultations: Cardiology  Procedures/Studies: US Venous Img Lower Right (DVT Study)  Result Date: 08/31/2020 CLINICAL DATA:  Right lower extremity pain. EXAM: RIGHT LOWER EXTREMITY VENOUS DOPPLER ULTRASOUND TECHNIQUE: Gray-scale sonography with graded compression, as well as color Doppler and duplex ultrasound were performed to evaluate the lower extremity deep venous systems from the level of the common femoral vein and including the common femoral, femoral, profunda femoral, popliteal and calf veins including the posterior tibial, peroneal and gastrocnemius veins when visible. The superficial great saphenous vein was also interrogated. Spectral Doppler was utilized to evaluate flow at rest and with distal augmentation maneuvers in the common femoral, femoral and popliteal veins. COMPARISON:  None. FINDINGS:  Contralateral Common Femoral Vein: Respiratory phasicity is normal and symmetric with the symptomatic side. No evidence of thrombus. Normal compressibility. Common Femoral Vein: No evidence of thrombus. Normal compressibility, respiratory phasicity and response to augmentation. Saphenofemoral Junction: No evidence of thrombus. Normal compressibility and flow on color Doppler imaging. Profunda Femoral Vein: No evidence of thrombus. Normal compressibility and flow on color Doppler imaging. Femoral Vein: No evidence of thrombus. Normal compressibility, respiratory phasicity and response to augmentation. Popliteal Vein: No evidence of thrombus. Normal compressibility, respiratory phasicity and response to augmentation. Calf Veins: No evidence of thrombus. Normal compressibility and flow on color Doppler imaging. Superficial Great Saphenous Vein: No evidence of thrombus. Normal compressibility. Venous Reflux:  None. Other Findings:  None. IMPRESSION: Negative for deep venous thrombosis in right lower extremity. Electronically Signed   By: Richarda Overlie M.D.   On: 08/31/2020 17:22   DG Chest Port 1 View  Result Date: 08/31/2020 CLINICAL DATA:  Near syncope EXAM: PORTABLE CHEST 1 VIEW COMPARISON:  Chest radiograph 03/07/2015 FINDINGS: The heart is mildly enlarged, unchanged. The mediastinal contours are within normal limits. There is slight asymmetric elevation of the right hemidiaphragm, unchanged. There is no focal consolidation  or pulmonary edema. There is no pleural effusion or pneumothorax. There is degenerative change of both shoulders. There is no acute osseous abnormality. IMPRESSION: Unchanged mild cardiomegaly. Otherwise, no radiographic evidence of acute cardiopulmonary process. Electronically Signed   By: Lesia Hausen MD   On: 08/31/2020 14:30   ECHOCARDIOGRAM COMPLETE  Result Date: 09/01/2020    ECHOCARDIOGRAM REPORT   Patient Name:   Gregory [name] Date of Exam: 09/01/2020 Medical Rec #:  [id number]         Height:       64.0 in Accession #:    [id number]       Weight:       208.0 lb Date of Birth:  Jan 30, 1945         BSA:          1.989 m Patient Age:    74 years         BP:           106/86 mmHg Patient Gender: M                HR:           59 bpm. [facility] Procedure: 2D Echo, Cardiac Doppler and Color Doppler Indications:    Syncope  History:        Patient has prior history of Echocardiogram examinations, most                 recent 12/05/2014. Previous Myocardial Infarction and CAD, COPD,                 Arrythmias:LBBB and Atrial Fibrillation, Signs/Symptoms:Syncope;                 Risk Factors:Hypertension and Dyslipidemia.  Sonographer:    Mikki Harbor Referring Phys: [phone] Heloise Beecham EMOKPAE IMPRESSIONS  1. Distal septal / apical hypokinesis . Left ventricular ejection fraction, by estimation, is 50 to 55%. The left ventricle has low normal function. The left ventricle demonstrates regional wall motion abnormalities (see scoring diagram/findings for description). There is mild asymmetric left ventricular hypertrophy of the basal and septal segments. Left ventricular diastolic parameters were normal.  2. Right ventricular systolic function is normal. The right ventricular size is normal.  3. Left atrial size was mildly dilated.  4. Right atrial size was mildly dilated.  5. The mitral valve is normal in structure. No evidence of mitral valve regurgitation. No evidence of mitral stenosis.  6. The aortic valve is tricuspid. There is mild calcification of the aortic valve. Aortic valve regurgitation is not visualized. Mild aortic valve sclerosis is present, with no evidence of aortic valve stenosis.  7. The inferior vena cava is normal in size with greater than 50% respiratory variability, suggesting right atrial pressure of 3 mmHg. FINDINGS  Left Ventricle: Distal septal / apical hypokinesis. Left ventricular ejection fraction, by estimation, is 50 to 55%. The left ventricle has low normal  function. The left ventricle demonstrates regional wall motion abnormalities. The left ventricular internal cavity size was normal in size. There is mild asymmetric left ventricular hypertrophy of the basal and septal segments. Left ventricular diastolic parameters were normal. Right Ventricle: The right ventricular size is normal. No increase in right ventricular wall thickness. Right ventricular systolic function is normal. Left Atrium: Left atrial size was mildly dilated. Right Atrium: Right atrial size was mildly dilated. Pericardium: There is no evidence of pericardial effusion. Mitral Valve: The mitral valve is normal in structure. No evidence  of mitral valve regurgitation. No evidence of mitral valve stenosis. MV peak gradient, 4.2 mmHg. The mean mitral valve gradient is 2.0 mmHg. Tricuspid Valve: The tricuspid valve is normal in structure. Tricuspid valve regurgitation is not demonstrated. No evidence of tricuspid stenosis. Aortic Valve: The aortic valve is tricuspid. There is mild calcification of the aortic valve. Aortic valve regurgitation is not visualized. Mild aortic valve sclerosis is present, with no evidence of aortic valve stenosis. Aortic valve mean gradient measures 4.0 mmHg. Aortic valve peak gradient measures 6.7 mmHg. Aortic valve area, by VTI measures 2.64 cm. Pulmonic Valve: The pulmonic valve was normal in structure. Pulmonic valve regurgitation is not visualized. No evidence of pulmonic stenosis. Aorta: The aortic root is normal in size and structure. Venous: The inferior vena cava is normal in size with greater than 50% respiratory variability, suggesting right atrial pressure of 3 mmHg. IAS/Shunts: No atrial level shunt detected by color flow Doppler.  LEFT VENTRICLE PLAX 2D LVIDd:         4.39 cm  Diastology LVIDs:         2.41 cm  LV e' medial:    9.11 cm/s LV PW:         1.25 cm  LV E/e' medial:  9.1 LV IVS:        1.25 cm  LV e' lateral:   10.30 cm/s LVOT diam:     2.10 cm  LV E/e'  lateral: 8.1 LV SV:         87 LV SV Index:   44 LVOT Area:     3.46 cm  RIGHT VENTRICLE RV S prime:     21.70 cm/s TAPSE (M-mode): 2.9 cm LEFT ATRIUM             Index       RIGHT ATRIUM           Index LA diam:        4.20 cm 2.11 cm/m  RA Area:     11.90 cm LA Vol (A2C):   30.3 ml 15.23 ml/m RA Volume:   25.40 ml  12.77 ml/m LA Vol (A4C):   36.5 ml 18.35 ml/m LA Biplane Vol: 33.2 ml 16.69 ml/m  AORTIC VALVE AV Area (Vmax):    2.93 cm AV Area (Vmean):   2.72 cm AV Area (VTI):     2.64 cm AV Vmax:           129.00 cm/s AV Vmean:          88.400 cm/s AV VTI:            0.330 m AV Peak Grad:      6.7 mmHg AV Mean Grad:      4.0 mmHg LVOT Vmax:         109.00 cm/s LVOT Vmean:        69.500 cm/s LVOT VTI:          0.252 m LVOT/AV VTI ratio: 0.76  AORTA Ao Root diam: 2.90 cm MITRAL VALVE MV Area (PHT): 3.54 cm    SHUNTS MV Area VTI:   3.01 cm    Systemic VTI:  0.25 m MV Peak grad:  4.2 mmHg    Systemic Diam: 2.10 cm MV Mean grad:  2.0 mmHg MV Vmax:       1.02 m/s MV Vmean:      67.6 cm/s MV Decel Time: 214 msec MV E velocity: 83.10 cm/s MV A velocity: 84.80 cm/s MV E/A ratio:  0.98 Charlton Haws MD Electronically signed by Charlton Haws MD Signature Date/Time: 09/01/2020/12:47:27 PM    Final     Subjective: Eager to go home  Discharge Exam: Vitals:   09/02/20 0734 09/02/20 1445  BP:  130/73  Pulse:  77  Resp:  18  Temp:  98.6 F (37 C)  SpO2: 97% 96%   Vitals:   09/01/20 2035 09/02/20 0507 09/02/20 0734 09/02/20 1445  BP: 112/69 127/68  130/73  Pulse: 68 (!) 52  77  Resp: Temp: 98.4 F (36.9 C) 98.2 F (36.8 C)  98.6 F (37 C)  TempSrc: Oral Oral  Oral  SpO2: 97% 97% 97% 96%  Weight:      Height:       General exam: Awake, laying in bed, in nad Respiratory system: Normal respiratory effort, no wheezing Cardiovascular system: regular rate, s1, s2 Gastrointestinal system: Soft, nondistended, positive BS Central nervous system: CN2-12 grossly intact, strength  intact Extremities: Perfused, no clubbing Skin: Normal skin turgor, no notable skin lesions seen Psychiatry: Mood normal // no visual hallucinations   The results of significant diagnostics from this hospitalization (including imaging, microbiology, ancillary and laboratory) are listed below for reference.     Microbiology: Recent Results (from the past 240 hour(s))  Resp Panel by RT-PCR (Flu A&B, Covid) Nasopharyngeal Swab     Status: None   Collection Time: 08/31/20  4:23 PM   Specimen: Nasopharyngeal Swab; Nasopharyngeal(NP) swabs in vial transport medium  Result Value Ref Range Status   SARS Coronavirus 2 by RT PCR NEGATIVE NEGATIVE Final    Comment: (NOTE) SARS-CoV-2 target nucleic acids are NOT DETECTED.  The SARS-CoV-2 RNA is generally detectable in upper respiratory specimens during the acute phase of infection. The lowest concentration of SARS-CoV-2 viral copies this assay can detect is 138 copies/mL. A negative result does not preclude SARS-Cov-2 infection and should not be used as the sole basis for treatment or other patient management decisions. A negative result may occur with  improper specimen collection/handling, submission of specimen other than nasopharyngeal swab, presence of viral mutation(s) within the areas targeted by this assay, and inadequate number of viral copies(<138 copies/mL). A negative result must be combined with clinical observations, patient history, and epidemiological information. The expected result is Negative.  Fact Sheet for Patients:  BloggerCourse.com  Fact Sheet for Healthcare Providers:  SeriousBroker.it  This test is no t yet approved or cleared by the [place] FDA and  has been authorized for detection and/or diagnosis of SARS-CoV-2 by FDA under an Emergency Use Authorization (EUA). This EUA will remain  in effect (meaning this test can be used) for the duration of  the COVID-19 declaration under Section 564(b)(1) of the Act, 21 U.S.C.section 360bbb-3(b)(1), unless the authorization is terminated  or revoked sooner.       Influenza A by PCR NEGATIVE NEGATIVE Final   Influenza B by PCR NEGATIVE NEGATIVE Final    Comment: (NOTE) The Xpert Xpress SARS-CoV-2/FLU/RSV plus assay is intended as an aid in the diagnosis of influenza from Nasopharyngeal swab specimens and should not be used as a sole basis for treatment. Nasal washings and aspirates are unacceptable for Xpert Xpress SARS-CoV-2/FLU/RSV testing.  Fact Sheet for Patients: BloggerCourse.com  Fact Sheet for Healthcare Providers: SeriousBroker.it  This test is not yet approved or cleared by the [place] FDA and has been authorized for detection and/or diagnosis of SARS-CoV-2 by FDA under an Emergency Use Authorization (EUA). This EUA will  remain in effect (meaning this test can be used) for the duration of the COVID-19 declaration under Section 564(b)(1) of the Act, 21 U.S.C. section 360bbb-3(b)(1), unless the authorization is terminated or revoked.  Performed at [facility], [address], [place], [place] [address]      Labs: BNP (last 3 results) No results for input(s): BNP in the last 8760 hours. Basic Metabolic Panel: Recent Labs  Lab 08/31/20 1340 08/31/20 2108 09/01/20 0421 09/02/20 0436  NA 135  --  138 137  K 4.5  --  4.2 4.0  CL 110  --  111 107  CO2 20*  --  22 24  GLUCOSE 110*  --  98 108*  BUN 24*  --  21 18  CREATININE 1.68*  --  1.39* 1.35*  CALCIUM 8.9  --  9.0 9.1  MG  --  2.1  --   --    Liver Function Tests: Recent Labs  Lab 08/31/20 1340  AST 25  ALT 18  ALKPHOS 40  BILITOT 1.3*  PROT 7.6  ALBUMIN 4.2   No results for input(s): LIPASE, AMYLASE in the last 168 hours. No results for input(s): AMMONIA in the last 168 hours. CBC: Recent Labs  Lab 08/31/20 1340 09/01/20 0421  09/02/20 0436  WBC 11.1* 9.5 10.2  NEUTROABS 8.2*  --   --   HGB 14.2 12.7* 13.5  HCT 43.5 38.5* 40.5  MCV 92.8 93.0 92.7  PLT 270 244 257   Cardiac Enzymes: No results for input(s): CKTOTAL, CKMB, CKMBINDEX, TROPONINI in the last 168 hours. BNP: Invalid input(s): POCBNP CBG: No results for input(s): GLUCAP in the last 168 hours. D-Dimer Recent Labs    09/01/20 0947  DDIMER 0.63*   Hgb A1c No results for input(s): HGBA1C in the last 72 hours. Lipid Profile No results for input(s): CHOL, HDL, LDLCALC, TRIG, CHOLHDL, LDLDIRECT in the last 72 hours. Thyroid function studies No results for input(s): TSH, T4TOTAL, T3FREE, THYROIDAB in the last 72 hours.  Invalid input(s): FREET3 Anemia work up No results for input(s): VITAMINB12, FOLATE, FERRITIN, TIBC, IRON, RETICCTPCT in the last 72 hours. Urinalysis    Component Value Date/Time   COLORURINE YELLOW 03/07/2015 1651   APPEARANCEUR CLEAR 03/07/2015 1651   LABSPEC <1.005 (L) 03/07/2015 1651   PHURINE 6.0 03/07/2015 1651   GLUCOSEU NEGATIVE 03/07/2015 1651   HGBUR TRACE (A) 03/07/2015 1651   BILIRUBINUR NEGATIVE 03/07/2015 1651   KETONESUR NEGATIVE 03/07/2015 1651   PROTEINUR NEGATIVE 03/07/2015 1651   UROBILINOGEN 0.2 09/08/2012 1833   NITRITE NEGATIVE 03/07/2015 1651   LEUKOCYTESUR NEGATIVE 03/07/2015 1651   Sepsis Labs Invalid input(s): PROCALCITONIN,  WBC,  LACTICIDVEN Microbiology Recent Results (from the past 240 hour(s))  Resp Panel by RT-PCR (Flu A&B, Covid) Nasopharyngeal Swab     Status: None   Collection Time: 08/31/20  4:23 PM   Specimen: Nasopharyngeal Swab; Nasopharyngeal(NP) swabs in vial transport medium  Result Value Ref Range Status   SARS Coronavirus 2 by RT PCR NEGATIVE NEGATIVE Final    Comment: (NOTE) SARS-CoV-2 target nucleic acids are NOT DETECTED.  The SARS-CoV-2 RNA is generally detectable in upper respiratory specimens during the acute phase of infection. The lowest concentration of  SARS-CoV-2 viral copies this assay can detect is 138 copies/mL. A negative result does not preclude SARS-Cov-2 infection and should not be used as the sole basis for treatment or other patient management decisions. A negative result may occur with  improper specimen collection/handling, submission of specimen other  than nasopharyngeal swab, presence of viral mutation(s) within the areas targeted by this assay, and inadequate number of viral copies(<138 copies/mL). A negative result must be combined with clinical observations, patient history, and epidemiological information. The expected result is Negative.  Fact Sheet for Patients:  BloggerCourse.com  Fact Sheet for Healthcare Providers:  SeriousBroker.it  This test is no t yet approved or cleared by the [place] FDA and  has been authorized for detection and/or diagnosis of SARS-CoV-2 by FDA under an Emergency Use Authorization (EUA). This EUA will remain  in effect (meaning this test can be used) for the duration of the COVID-19 declaration under Section 564(b)(1) of the Act, 21 U.S.C.section 360bbb-3(b)(1), unless the authorization is terminated  or revoked sooner.       Influenza A by PCR NEGATIVE NEGATIVE Final   Influenza B by PCR NEGATIVE NEGATIVE Final    Comment: (NOTE) The Xpert Xpress SARS-CoV-2/FLU/RSV plus assay is intended as an aid in the diagnosis of influenza from Nasopharyngeal swab specimens and should not be used as a sole basis for treatment. Nasal washings and aspirates are unacceptable for Xpert Xpress SARS-CoV-2/FLU/RSV testing.  Fact Sheet for Patients: BloggerCourse.com  Fact Sheet for Healthcare Providers: SeriousBroker.it  This test is not yet approved or cleared by the [place] FDA and has been authorized for detection and/or diagnosis of SARS-CoV-2 by FDA under an Emergency Use  Authorization (EUA). This EUA will remain in effect (meaning this test can be used) for the duration of the COVID-19 declaration under Section 564(b)(1) of the Act, 21 U.S.C. section 360bbb-3(b)(1), unless the authorization is terminated or revoked.  Performed at [facility], [address] [place], [place] [address]    Time spent: 30 min  SIGNED:   Rickey Barbara, MD  [facility] 09/02/2020, 3:57 PM  If 7PM-7AM, please contact night-coverage

## 2020-09-02 NOTE — Discharge Instructions (Signed)
Do not drive until cleared by your Cardiologist or PCP

## 2020-09-02 NOTE — Progress Notes (Signed)
Nsg Discharge Note  Admit Date:  08/31/2020 Discharge date: 09/02/2020   Gregory [name] to be D/C'd Home per MD order.  AVS completed.  Copy for chart, and copy for patient signed, and dated. Patient/caregiver able to verbalize understanding.  Discharge Medication: Allergies as of 09/02/2020   No Known Allergies      Medication List     STOP taking these medications    amLODipine 10 MG tablet Commonly known as: NORVASC   atorvastatin 20 MG tablet Commonly known as: LIPITOR   budesonide-formoterol 160-4.5 MCG/ACT inhaler Commonly known as: SYMBICORT   cetirizine 10 MG tablet Commonly known as: ZYRTEC   furosemide 20 MG tablet Commonly known as: LASIX   isosorbide mononitrate 30 MG 24 hr tablet Commonly known as: IMDUR   lisinopril 40 MG tablet Commonly known as: ZESTRIL   spironolactone 25 MG tablet Commonly known as: ALDACTONE       TAKE these medications    aspirin 81 MG chewable tablet Chew 1 tablet (81 mg total) by mouth daily.   Breztri Aerosphere 160-9-4.8 MCG/ACT Aero Generic drug: Budeson-Glycopyrrol-Formoterol Inhale 2 puffs into the lungs 2 (two) times daily.   celecoxib 200 MG capsule Commonly known as: CELEBREX Take 200-400 mg by mouth daily as needed.   clopidogrel 75 MG tablet Commonly known as: PLAVIX TAKE ONE TABLET BY MOUTH ONCE DAILY   **NEEDS OFFICE VISIT** What changed: See the new instructions.   diclofenac Sodium 1 % Gel Commonly known as: VOLTAREN Apply 4 g topically 4 (four) times daily.   Flovent Diskus 100 MCG/BLIST Aepb Generic drug: Fluticasone Propionate (Inhal) Inhale into the lungs.   fluticasone 50 MCG/ACT nasal spray Commonly known as: FLONASE Place 1 spray into both nostrils 2 (two) times daily.   metoprolol tartrate 25 MG tablet Commonly known as: LOPRESSOR TAKE ONE-HALF TABLET BY MOUTH TWICE DAILY   nitroGLYCERIN 0.4 MG SL tablet Commonly known as: NITROSTAT Place 1 tablet (0.4 mg total) under the  tongue every 5 (five) minutes x 3 doses as needed for chest pain.   rosuvastatin 40 MG tablet Commonly known as: CRESTOR Take 40 mg by mouth daily.   traZODone 50 MG tablet Commonly known as: DESYREL Take 50 mg by mouth at bedtime.        Discharge Assessment: Vitals:   09/02/20 0734 09/02/20 1445  BP:  130/73  Pulse:  77  Resp:  18  Temp:  98.6 F (37 C)  SpO2: 97% 96%   Skin clean, dry and intact without evidence of skin break down, no evidence of skin tears noted. IV catheter discontinued intact. Site without signs and symptoms of complications - no redness or edema noted at insertion site, patient denies c/o pain - only slight tenderness at site.  Dressing with slight pressure applied.  D/c Instructions-Education: Discharge instructions given to patient/family with verbalized understanding. D/c education completed with patient/family including follow up instructions, medication list, d/c activities limitations if indicated, with other d/c instructions as indicated by MD - patient able to verbalize understanding, all questions fully answered. Patient instructed to return to ED, call 911, or call MD for any changes in condition.  Patient escorted via WC, and D/C home via private auto.  Demetrio Lapping, LPN 1/[id number]/7371 0:62 PM

## 2020-09-08 DIAGNOSIS — I2510 Atherosclerotic heart disease of native coronary artery without angina pectoris: Secondary | ICD-10-CM | POA: Diagnosis not present

## 2020-09-08 DIAGNOSIS — R0602 Shortness of breath: Secondary | ICD-10-CM | POA: Diagnosis not present

## 2020-09-08 DIAGNOSIS — E785 Hyperlipidemia, unspecified: Secondary | ICD-10-CM | POA: Diagnosis not present

## 2020-09-08 DIAGNOSIS — R609 Edema, unspecified: Secondary | ICD-10-CM | POA: Diagnosis not present

## 2020-09-08 DIAGNOSIS — I340 Nonrheumatic mitral (valve) insufficiency: Secondary | ICD-10-CM | POA: Diagnosis not present

## 2020-09-08 DIAGNOSIS — I10 Essential (primary) hypertension: Secondary | ICD-10-CM | POA: Diagnosis not present

## 2020-09-08 DIAGNOSIS — G4709 Other insomnia: Secondary | ICD-10-CM | POA: Diagnosis not present

## 2020-09-08 DIAGNOSIS — M13831 Other specified arthritis, right wrist: Secondary | ICD-10-CM | POA: Diagnosis not present

## 2020-09-08 DIAGNOSIS — R55 Syncope and collapse: Secondary | ICD-10-CM | POA: Diagnosis not present

## 2020-09-14 DIAGNOSIS — I2510 Atherosclerotic heart disease of native coronary artery without angina pectoris: Secondary | ICD-10-CM | POA: Diagnosis not present

## 2020-09-14 DIAGNOSIS — I10 Essential (primary) hypertension: Secondary | ICD-10-CM | POA: Diagnosis not present

## 2020-09-14 DIAGNOSIS — I340 Nonrheumatic mitral (valve) insufficiency: Secondary | ICD-10-CM | POA: Diagnosis not present

## 2020-09-14 DIAGNOSIS — E785 Hyperlipidemia, unspecified: Secondary | ICD-10-CM | POA: Diagnosis not present

## 2020-10-12 DIAGNOSIS — G4709 Other insomnia: Secondary | ICD-10-CM | POA: Diagnosis not present

## 2020-10-12 DIAGNOSIS — Z23 Encounter for immunization: Secondary | ICD-10-CM | POA: Diagnosis not present

## 2020-10-12 DIAGNOSIS — M13831 Other specified arthritis, right wrist: Secondary | ICD-10-CM | POA: Diagnosis not present

## 2020-10-12 DIAGNOSIS — R55 Syncope and collapse: Secondary | ICD-10-CM | POA: Diagnosis not present

## 2020-10-12 DIAGNOSIS — I2510 Atherosclerotic heart disease of native coronary artery without angina pectoris: Secondary | ICD-10-CM | POA: Diagnosis not present

## 2020-10-12 DIAGNOSIS — J301 Allergic rhinitis due to pollen: Secondary | ICD-10-CM | POA: Diagnosis not present

## 2020-10-12 DIAGNOSIS — E785 Hyperlipidemia, unspecified: Secondary | ICD-10-CM | POA: Diagnosis not present

## 2020-10-12 DIAGNOSIS — J449 Chronic obstructive pulmonary disease, unspecified: Secondary | ICD-10-CM | POA: Diagnosis not present

## 2020-10-12 DIAGNOSIS — I10 Essential (primary) hypertension: Secondary | ICD-10-CM | POA: Diagnosis not present

## 2020-10-12 DIAGNOSIS — R609 Edema, unspecified: Secondary | ICD-10-CM | POA: Diagnosis not present

## 2020-10-12 DIAGNOSIS — M109 Gout, unspecified: Secondary | ICD-10-CM | POA: Diagnosis not present

## 2020-10-27 DIAGNOSIS — E785 Hyperlipidemia, unspecified: Secondary | ICD-10-CM | POA: Diagnosis not present

## 2020-10-27 DIAGNOSIS — J301 Allergic rhinitis due to pollen: Secondary | ICD-10-CM | POA: Diagnosis not present

## 2020-10-27 DIAGNOSIS — I2510 Atherosclerotic heart disease of native coronary artery without angina pectoris: Secondary | ICD-10-CM | POA: Diagnosis not present

## 2020-10-27 DIAGNOSIS — M13831 Other specified arthritis, right wrist: Secondary | ICD-10-CM | POA: Diagnosis not present

## 2020-10-27 DIAGNOSIS — R609 Edema, unspecified: Secondary | ICD-10-CM | POA: Diagnosis not present

## 2020-10-27 DIAGNOSIS — G4709 Other insomnia: Secondary | ICD-10-CM | POA: Diagnosis not present

## 2020-10-27 DIAGNOSIS — I10 Essential (primary) hypertension: Secondary | ICD-10-CM | POA: Diagnosis not present

## 2020-12-15 DIAGNOSIS — E785 Hyperlipidemia, unspecified: Secondary | ICD-10-CM | POA: Diagnosis not present

## 2020-12-15 DIAGNOSIS — I2510 Atherosclerotic heart disease of native coronary artery without angina pectoris: Secondary | ICD-10-CM | POA: Diagnosis not present

## 2020-12-15 DIAGNOSIS — I10 Essential (primary) hypertension: Secondary | ICD-10-CM | POA: Diagnosis not present

## 2020-12-15 DIAGNOSIS — R0602 Shortness of breath: Secondary | ICD-10-CM | POA: Diagnosis not present

## 2021-01-22 DIAGNOSIS — E785 Hyperlipidemia, unspecified: Secondary | ICD-10-CM | POA: Diagnosis not present

## 2021-01-22 DIAGNOSIS — I10 Essential (primary) hypertension: Secondary | ICD-10-CM | POA: Diagnosis not present

## 2021-01-27 ENCOUNTER — Ambulatory Visit
Admission: RE | Admit: 2021-01-27 | Discharge: 2021-01-27 | Disposition: A | Discharge status: Home / Self Care | Payer: Medicare Other | Source: Ambulatory Visit | Attending: Internal Medicine | Admitting: Internal Medicine

## 2021-01-27 ENCOUNTER — Ambulatory Visit
Admission: RE | Admit: 2021-01-27 | Discharge: 2021-01-27 | Disposition: A | Discharge status: Home / Self Care | Payer: Medicare Other | Attending: Internal Medicine | Admitting: Internal Medicine

## 2021-01-27 ENCOUNTER — Other Ambulatory Visit: Payer: Self-pay | Admitting: Internal Medicine

## 2021-01-27 ENCOUNTER — Other Ambulatory Visit: Payer: Self-pay

## 2021-01-27 DIAGNOSIS — E785 Hyperlipidemia, unspecified: Secondary | ICD-10-CM | POA: Diagnosis not present

## 2021-01-27 DIAGNOSIS — M7062 Trochanteric bursitis, left hip: Secondary | ICD-10-CM

## 2021-01-27 DIAGNOSIS — I10 Essential (primary) hypertension: Secondary | ICD-10-CM | POA: Diagnosis not present

## 2021-01-27 DIAGNOSIS — M25552 Pain in left hip: Secondary | ICD-10-CM | POA: Diagnosis not present

## 2021-01-27 DIAGNOSIS — I2510 Atherosclerotic heart disease of native coronary artery without angina pectoris: Secondary | ICD-10-CM | POA: Diagnosis not present

## 2021-01-27 DIAGNOSIS — G4709 Other insomnia: Secondary | ICD-10-CM | POA: Diagnosis not present

## 2021-01-27 DIAGNOSIS — M109 Gout, unspecified: Secondary | ICD-10-CM | POA: Diagnosis not present

## 2021-01-27 DIAGNOSIS — J301 Allergic rhinitis due to pollen: Secondary | ICD-10-CM | POA: Diagnosis not present

## 2021-01-27 DIAGNOSIS — M13831 Other specified arthritis, right wrist: Secondary | ICD-10-CM | POA: Diagnosis not present

## 2021-02-24 ENCOUNTER — Emergency Department (HOSPITAL_COMMUNITY): Payer: Medicare Other

## 2021-02-24 ENCOUNTER — Encounter (HOSPITAL_COMMUNITY): Payer: Self-pay | Admitting: *Deleted

## 2021-02-24 ENCOUNTER — Emergency Department (HOSPITAL_COMMUNITY)
Admission: EM | Admit: 2021-02-24 | Discharge: 2021-02-24 | Disposition: A | Discharge status: Home / Self Care | Payer: Medicare Other | Attending: Emergency Medicine | Admitting: Emergency Medicine

## 2021-02-24 ENCOUNTER — Other Ambulatory Visit: Payer: Self-pay

## 2021-02-24 DIAGNOSIS — M160 Bilateral primary osteoarthritis of hip: Secondary | ICD-10-CM | POA: Diagnosis not present

## 2021-02-24 DIAGNOSIS — Z79899 Other long term (current) drug therapy: Secondary | ICD-10-CM | POA: Insufficient documentation

## 2021-02-24 DIAGNOSIS — M25561 Pain in right knee: Secondary | ICD-10-CM

## 2021-02-24 DIAGNOSIS — I10 Essential (primary) hypertension: Secondary | ICD-10-CM | POA: Insufficient documentation

## 2021-02-24 DIAGNOSIS — M47816 Spondylosis without myelopathy or radiculopathy, lumbar region: Secondary | ICD-10-CM | POA: Diagnosis not present

## 2021-02-24 DIAGNOSIS — Z7982 Long term (current) use of aspirin: Secondary | ICD-10-CM | POA: Diagnosis not present

## 2021-02-24 DIAGNOSIS — M1711 Unilateral primary osteoarthritis, right knee: Secondary | ICD-10-CM | POA: Insufficient documentation

## 2021-02-24 DIAGNOSIS — Z7902 Long term (current) use of antithrombotics/antiplatelets: Secondary | ICD-10-CM | POA: Diagnosis not present

## 2021-02-24 DIAGNOSIS — M1611 Unilateral primary osteoarthritis, right hip: Secondary | ICD-10-CM | POA: Diagnosis not present

## 2021-02-24 MED ORDER — PREDNISONE 10 MG PO TABS: ORAL_TABLET | ORAL | 0 refills | Status: DC

## 2021-02-24 NOTE — ED Notes (Signed)
Pharmacy tech called at this time to review PTA meds per PA.

## 2021-02-24 NOTE — ED Notes (Signed)
Patient transported to X-ray 

## 2021-02-24 NOTE — ED Triage Notes (Signed)
Pt c/o right leg pain from hip to knee since last week. Pain is worse in the knee. Denies injury.

## 2021-02-24 NOTE — Discharge Instructions (Signed)
You are being placed on a prednisone taper which should resolve your pain, especially if this is an early gout flare.  Elevation and warm compresses may also help with your pain.  Continue to take your other home medications.  Plan to follow-up with either your primary provider, if your symptoms persist you may also benefit from an orthopedic consult.  Dr. Romeo Apple is our orthopedist, call his office for an appointment if your symptoms are not improving with this treatment plan.

## 2021-02-24 NOTE — ED Provider Notes (Signed)
[facility] EMERGENCY DEPARTMENT Provider Note   CSN: [id number] Arrival date & time: 02/24/21  1251     History  Chief Complaint  Patient presents with   Leg Pain    Gregory [name] is a 76 y.o. male with a past medical history including ACS, hypertension, dyslipidemia and history of gout presenting with right knee pain.  He also describes pain in his low right lateral hip, both symptoms have been present for about 1 week.  He denies any injuries or overuse, family member at the bedside states his greatest activity is walking from the living room to the bathroom.  He does endorse a history of gout, but states it generally is in his feet, his right knee however does remind him of a gouty flare.  Pain is worse with weight bearing, movement of the joint but is also present at rest.  He has had no fevers or chills, he has had no swelling of the knee.  Denies injuries recently, reports a distal injury where he had a piece of metal lodged in the knee region.  He has taken Tylenol prior to arrival with no improvement in his symptoms.  The history is provided by the patient and the spouse.      Home Medications Prior to Admission medications   Medication Sig Start Date End Date Taking? Authorizing Provider  acetaminophen (TYLENOL) 500 MG tablet Take 1,500 mg by mouth every 6 (six) hours as needed for moderate pain.   Yes [provider]  aspirin 81 MG chewable tablet Chew 1 tablet (81 mg total) by mouth daily. 12/06/14  Yes Kilroy, Luke K, PA-C  BREZTRI AEROSPHERE 160-9-4.8 MCG/ACT AERO Inhale 2 puffs into the lungs 2 (two) times daily. 08/17/20  Yes [provider]  celecoxib (CELEBREX) 200 MG capsule Take 200-400 mg by mouth daily as needed. 06/29/20  Yes [provider]  cetirizine (ZYRTEC) 10 MG tablet Take 10 mg by mouth every morning. 01/27/21  Yes [provider]  clopidogrel (PLAVIX) 75 MG tablet TAKE ONE TABLET BY MOUTH ONCE DAILY   **NEEDS OFFICE  VISIT** Patient taking differently: Take 75 mg by mouth daily. 03/15/16  Yes Kilroy, Luke K, PA-C  COMBIVENT RESPIMAT 20-100 MCG/ACT AERS respimat Inhale 1 puff into the lungs every 6 (six) hours as needed for shortness of breath. 01/28/21  Yes [provider]  fluticasone (FLONASE) 50 MCG/ACT nasal spray Place 1 spray into both nostrils 2 (two) times daily. 11/18/14  Yes [provider]  isosorbide mononitrate (IMDUR) 30 MG 24 hr tablet Take 30 mg by mouth daily. 02/16/21  Yes [provider]  lisinopril (ZESTRIL) 40 MG tablet Take 40 mg by mouth daily. 01/03/21  Yes [provider]  metoprolol tartrate (LOPRESSOR) 25 MG tablet TAKE ONE-HALF TABLET BY MOUTH TWICE DAILY 11/26/15  Yes Jodelle Gross, NP  predniSONE (DELTASONE) 10 MG tablet 6, 5, 4, 3, 2 then 1 tablet by mouth daily for 6 days total. 02/24/21  Yes Talha Iser, Raynelle Fanning, PA-C  rosuvastatin (CRESTOR) 40 MG tablet Take 40 mg by mouth daily. 06/29/20  Yes [provider]  traZODone (DESYREL) 50 MG tablet Take 50 mg by mouth at bedtime.   Yes [provider]  diclofenac Sodium (VOLTAREN) 1 % GEL Apply 4 g topically 4 (four) times daily. Patient not taking: Reported on 02/24/2021 08/01/20   Melene Plan, DO  nitroGLYCERIN (NITROSTAT) 0.4 MG SL tablet Place 1 tablet (0.4 mg total) under the tongue every 5 (five)  minutes x 3 doses as needed for chest pain. Patient not taking: Reported on 02/24/2021 12/06/14   Abelino DerrickKilroy, Luke K, PA-C      Allergies    Patient has no known allergies.    Review of Systems   Review of Systems  Constitutional:  Negative for chills and fever.  Musculoskeletal:  Positive for arthralgias. Negative for joint swelling and myalgias.  Skin: Negative.   Neurological:  Negative for weakness and numbness.  All other systems reviewed and are negative.  Physical Exam Updated Vital Signs BP (!) 147/73 (BP Location: Left Arm)    Pulse (!) 57    Temp 98.1 F (36.7 C) (Oral)    Resp 16     Ht 5\' 4"  (1.626 m)    Wt 89.8 kg    SpO2 96%    BMI 33.99 kg/m  Physical Exam Constitutional:      Appearance: He is well-developed.  HENT:     Head: Atraumatic.  Cardiovascular:     Comments: Pulses equal bilaterally Musculoskeletal:        General: Tenderness present.     Cervical back: Normal range of motion.     Right hip: Tenderness present. No bony tenderness or crepitus. Normal range of motion.     Right knee: No swelling, deformity, effusion or erythema. Decreased range of motion. Tenderness present. No MCL, LCL, ACL or PCL tenderness.     Comments: Patient is tender to palpation along the anterior medial and lateral joint space.  He is also tender to just light touch of his skin over his knee joint.  There is no appreciable effusion, there is no erythema.  He can range the knee joint adequately without crepitus but he does have significant pain with this maneuver.  Is also tender to palpation at his right lateral greater trochanter of the hip, no pain with internal or external rotation of the hip.  Dorsalis pedal pulses right full.  Skin:    General: Skin is warm and dry.  Neurological:     Mental Status: He is alert.     Sensory: No sensory deficit.     Motor: No weakness.     Deep Tendon Reflexes: Reflexes normal.    ED Results / Procedures / Treatments   Labs (all labs ordered are listed, but only abnormal results are displayed) Labs Reviewed - No data to display  EKG None  Radiology DG Knee Complete 4 Views Right  Result Date: 02/24/2021 CLINICAL DATA:  Pain EXAM: RIGHT KNEE - COMPLETE 4+ VIEW COMPARISON:  None. FINDINGS: There is no evidence of acute fracture or dislocation. There is no joint effusion. There is a 5 mm hyperdensity, either dense calcium or metallic fragment overlying and the anterior aspect of Hoffa's fat pad. Vascular calcifications. Mild patellofemoral predominant osteoarthritis. IMPRESSION: No acute osseous abnormality. Mild patellofemoral  predominant osteoarthritis. Dense calcification or metallic fragment overlying the anterior inferior aspect of Hoffa's fat pad of uncertain etiology and of questionable clinical significance. No priors for comparison. Electronically Signed   By: Caprice RenshawJacob  Kahn M.D.   On: 02/24/2021 15:16   DG Hip Unilat W or Wo Pelvis 2-3 Views Right  Result Date: 02/24/2021 CLINICAL DATA:  Pain EXAM: DG HIP (WITH OR WITHOUT PELVIS) 2-3V RIGHT COMPARISON:  01/27/2021 FINDINGS: There is no evidence of acute fracture or dislocation. Mild bilateral hip osteoarthritis. Lower lumbar spine degenerative changes. IMPRESSION: No acute osseous abnormality.  Mild osteoarthritis of the hips. Electronically Signed   By: Gerilyn PilgrimJacob  Park Breed M.D.   On: 02/24/2021 15:18    Procedures Procedures    Medications Ordered in ED Medications - No data to display  ED Course/ Medical Decision Making/ A&P                           Medical Decision Making Patient with a right knee pain and right lateral hip pain, no injuries, nature of his pain remind him of prior gouty flare.  Amount and/or Complexity of Data Reviewed Radiology: ordered and independent interpretation performed.    Details: X-rays reviewed and discussed with patient.  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  I do not feel this is associated with today's pain.  Hip and knee show mild osteoarthritis.  Risk OTC drugs. Prescription drug management. Risk Details: Patient with right knee and hip pain, he endorses that his knee pain remind him of his last gouty flare.  He will be treated for this with a prednisone taper, also recommended rest and heat therapy.  There is no evidence for septic knee joint on today's exam.  Imaging is reassuring.  Advised follow-up with his PCP and/or orthopedic follow-up if symptoms persist or worsen, he was given a referral for this.           Final Clinical Impression(s) / ED Diagnoses Final diagnoses:   Acute pain of right knee    Rx / DC Orders ED Discharge Orders          Ordered    predniSONE (DELTASONE) 10 MG tablet        02/24/21 1706              Burgess Amor, PA-C 02/24/21 1737    Gloris Manchester, MD 02/26/21 [phone]

## 2021-02-24 NOTE — ED Notes (Signed)
Pt verbalized understanding of discharge instructions sign pad not available.

## 2021-04-15 DIAGNOSIS — I739 Peripheral vascular disease, unspecified: Secondary | ICD-10-CM | POA: Diagnosis not present

## 2021-04-15 DIAGNOSIS — R609 Edema, unspecified: Secondary | ICD-10-CM | POA: Diagnosis not present

## 2021-04-15 DIAGNOSIS — I509 Heart failure, unspecified: Secondary | ICD-10-CM | POA: Diagnosis not present

## 2021-04-15 DIAGNOSIS — R55 Syncope and collapse: Secondary | ICD-10-CM | POA: Diagnosis not present

## 2021-04-15 DIAGNOSIS — E785 Hyperlipidemia, unspecified: Secondary | ICD-10-CM | POA: Diagnosis not present

## 2021-04-15 DIAGNOSIS — I10 Essential (primary) hypertension: Secondary | ICD-10-CM | POA: Diagnosis not present

## 2021-04-15 DIAGNOSIS — I340 Nonrheumatic mitral (valve) insufficiency: Secondary | ICD-10-CM | POA: Diagnosis not present

## 2021-04-15 DIAGNOSIS — I2510 Atherosclerotic heart disease of native coronary artery without angina pectoris: Secondary | ICD-10-CM | POA: Diagnosis not present

## 2021-04-16 DIAGNOSIS — G4709 Other insomnia: Secondary | ICD-10-CM | POA: Diagnosis not present

## 2021-04-16 DIAGNOSIS — M7062 Trochanteric bursitis, left hip: Secondary | ICD-10-CM | POA: Diagnosis not present

## 2021-04-16 DIAGNOSIS — E785 Hyperlipidemia, unspecified: Secondary | ICD-10-CM | POA: Diagnosis not present

## 2021-04-16 DIAGNOSIS — M109 Gout, unspecified: Secondary | ICD-10-CM | POA: Diagnosis not present

## 2021-04-16 DIAGNOSIS — M13831 Other specified arthritis, right wrist: Secondary | ICD-10-CM | POA: Diagnosis not present

## 2021-04-27 DIAGNOSIS — E785 Hyperlipidemia, unspecified: Secondary | ICD-10-CM | POA: Diagnosis not present

## 2021-04-27 DIAGNOSIS — M109 Gout, unspecified: Secondary | ICD-10-CM | POA: Diagnosis not present

## 2021-04-28 DIAGNOSIS — M13831 Other specified arthritis, right wrist: Secondary | ICD-10-CM | POA: Diagnosis not present

## 2021-04-28 DIAGNOSIS — J301 Allergic rhinitis due to pollen: Secondary | ICD-10-CM | POA: Diagnosis not present

## 2021-04-28 DIAGNOSIS — M609 Myositis, unspecified: Secondary | ICD-10-CM | POA: Diagnosis not present

## 2021-04-28 DIAGNOSIS — E785 Hyperlipidemia, unspecified: Secondary | ICD-10-CM | POA: Diagnosis not present

## 2021-04-28 DIAGNOSIS — I2510 Atherosclerotic heart disease of native coronary artery without angina pectoris: Secondary | ICD-10-CM | POA: Diagnosis not present

## 2021-04-28 DIAGNOSIS — M7062 Trochanteric bursitis, left hip: Secondary | ICD-10-CM | POA: Diagnosis not present

## 2021-04-28 DIAGNOSIS — G4709 Other insomnia: Secondary | ICD-10-CM | POA: Diagnosis not present

## 2021-07-30 DIAGNOSIS — M109 Gout, unspecified: Secondary | ICD-10-CM | POA: Diagnosis not present

## 2021-07-30 DIAGNOSIS — E785 Hyperlipidemia, unspecified: Secondary | ICD-10-CM | POA: Diagnosis not present

## 2021-08-02 DIAGNOSIS — M109 Gout, unspecified: Secondary | ICD-10-CM | POA: Diagnosis not present

## 2021-08-02 DIAGNOSIS — M7062 Trochanteric bursitis, left hip: Secondary | ICD-10-CM | POA: Diagnosis not present

## 2021-08-02 DIAGNOSIS — J301 Allergic rhinitis due to pollen: Secondary | ICD-10-CM | POA: Diagnosis not present

## 2021-08-02 DIAGNOSIS — I2510 Atherosclerotic heart disease of native coronary artery without angina pectoris: Secondary | ICD-10-CM | POA: Diagnosis not present

## 2021-08-02 DIAGNOSIS — M13831 Other specified arthritis, right wrist: Secondary | ICD-10-CM | POA: Diagnosis not present

## 2021-08-02 DIAGNOSIS — Z0001 Encounter for general adult medical examination with abnormal findings: Secondary | ICD-10-CM | POA: Diagnosis not present

## 2021-08-02 DIAGNOSIS — I10 Essential (primary) hypertension: Secondary | ICD-10-CM | POA: Diagnosis not present

## 2021-08-02 DIAGNOSIS — E785 Hyperlipidemia, unspecified: Secondary | ICD-10-CM | POA: Diagnosis not present

## 2021-08-02 DIAGNOSIS — G4709 Other insomnia: Secondary | ICD-10-CM | POA: Diagnosis not present

## 2021-08-16 DIAGNOSIS — R0602 Shortness of breath: Secondary | ICD-10-CM | POA: Diagnosis not present

## 2021-08-16 DIAGNOSIS — R55 Syncope and collapse: Secondary | ICD-10-CM | POA: Diagnosis not present

## 2021-08-16 DIAGNOSIS — R609 Edema, unspecified: Secondary | ICD-10-CM | POA: Diagnosis not present

## 2021-08-16 DIAGNOSIS — I10 Essential (primary) hypertension: Secondary | ICD-10-CM | POA: Diagnosis not present

## 2021-08-16 DIAGNOSIS — E785 Hyperlipidemia, unspecified: Secondary | ICD-10-CM | POA: Diagnosis not present

## 2021-08-16 DIAGNOSIS — I2510 Atherosclerotic heart disease of native coronary artery without angina pectoris: Secondary | ICD-10-CM | POA: Diagnosis not present

## 2021-08-23 DIAGNOSIS — I10 Essential (primary) hypertension: Secondary | ICD-10-CM | POA: Diagnosis not present

## 2021-08-23 DIAGNOSIS — M109 Gout, unspecified: Secondary | ICD-10-CM | POA: Diagnosis not present

## 2021-08-23 DIAGNOSIS — I2510 Atherosclerotic heart disease of native coronary artery without angina pectoris: Secondary | ICD-10-CM | POA: Diagnosis not present

## 2021-08-25 DIAGNOSIS — R0602 Shortness of breath: Secondary | ICD-10-CM | POA: Diagnosis not present

## 2021-08-30 DIAGNOSIS — R0602 Shortness of breath: Secondary | ICD-10-CM | POA: Diagnosis not present

## 2021-08-31 ENCOUNTER — Ambulatory Visit: Payer: Self-pay | Admitting: *Deleted

## 2021-08-31 NOTE — Patient Outreach (Signed)
  Care Coordination   08/31/2021 Name: Gregory [name] MRN: [id number] DOB: 10-Jun-1945   Care Coordination Outreach Attempts:  An unsuccessful telephone outreach was attempted today to offer the patient information about available care coordination services as a benefit of their health plan.   Follow Up Plan:  Additional outreach attempts will be made to offer the patient care coordination information and services.   Encounter Outcome:  No Answer  Care Coordination Interventions Activated:  No   Care Coordination Interventions:  No, not indicated    Rhae Lerner RN, MSN RN Care Management Coordinator  [facility] [phone] [email or web address]

## 2021-09-07 DIAGNOSIS — E785 Hyperlipidemia, unspecified: Secondary | ICD-10-CM | POA: Diagnosis not present

## 2021-09-07 DIAGNOSIS — R0602 Shortness of breath: Secondary | ICD-10-CM | POA: Diagnosis not present

## 2021-09-07 DIAGNOSIS — I10 Essential (primary) hypertension: Secondary | ICD-10-CM | POA: Diagnosis not present

## 2021-09-07 DIAGNOSIS — R55 Syncope and collapse: Secondary | ICD-10-CM | POA: Diagnosis not present

## 2021-09-07 DIAGNOSIS — I2510 Atherosclerotic heart disease of native coronary artery without angina pectoris: Secondary | ICD-10-CM | POA: Diagnosis not present

## 2021-09-07 DIAGNOSIS — R609 Edema, unspecified: Secondary | ICD-10-CM | POA: Diagnosis not present

## 2021-09-07 DIAGNOSIS — I340 Nonrheumatic mitral (valve) insufficiency: Secondary | ICD-10-CM | POA: Diagnosis not present

## 2021-09-14 DIAGNOSIS — R609 Edema, unspecified: Secondary | ICD-10-CM | POA: Diagnosis not present

## 2021-09-14 DIAGNOSIS — R0789 Other chest pain: Secondary | ICD-10-CM | POA: Diagnosis not present

## 2021-09-14 DIAGNOSIS — E785 Hyperlipidemia, unspecified: Secondary | ICD-10-CM | POA: Diagnosis not present

## 2021-09-14 DIAGNOSIS — I2510 Atherosclerotic heart disease of native coronary artery without angina pectoris: Secondary | ICD-10-CM | POA: Diagnosis not present

## 2021-09-14 DIAGNOSIS — I340 Nonrheumatic mitral (valve) insufficiency: Secondary | ICD-10-CM | POA: Diagnosis not present

## 2021-09-14 DIAGNOSIS — I10 Essential (primary) hypertension: Secondary | ICD-10-CM | POA: Diagnosis not present

## 2021-09-14 DIAGNOSIS — R55 Syncope and collapse: Secondary | ICD-10-CM | POA: Diagnosis not present

## 2021-09-16 ENCOUNTER — Ambulatory Visit: Payer: Self-pay

## 2021-09-20 ENCOUNTER — Ambulatory Visit: Payer: Self-pay | Admitting: Cardiovascular Disease

## 2021-09-20 DIAGNOSIS — R55 Syncope and collapse: Secondary | ICD-10-CM | POA: Diagnosis not present

## 2021-09-20 DIAGNOSIS — R609 Edema, unspecified: Secondary | ICD-10-CM | POA: Diagnosis not present

## 2021-09-20 DIAGNOSIS — R0789 Other chest pain: Secondary | ICD-10-CM | POA: Diagnosis not present

## 2021-09-20 DIAGNOSIS — I10 Essential (primary) hypertension: Secondary | ICD-10-CM | POA: Diagnosis not present

## 2021-09-20 DIAGNOSIS — I200 Unstable angina: Secondary | ICD-10-CM

## 2021-09-20 DIAGNOSIS — I340 Nonrheumatic mitral (valve) insufficiency: Secondary | ICD-10-CM | POA: Diagnosis not present

## 2021-09-20 DIAGNOSIS — I2510 Atherosclerotic heart disease of native coronary artery without angina pectoris: Secondary | ICD-10-CM | POA: Diagnosis not present

## 2021-09-20 DIAGNOSIS — E785 Hyperlipidemia, unspecified: Secondary | ICD-10-CM | POA: Diagnosis not present

## 2021-09-20 MED ORDER — SODIUM CHLORIDE 0.9% FLUSH: 3.0000 mL | Freq: Two times a day (BID) | INTRAVENOUS | Status: DC

## 2021-09-21 ENCOUNTER — Other Ambulatory Visit: Payer: Self-pay

## 2021-09-21 ENCOUNTER — Encounter
Admission: RE | Disposition: A | Discharge status: Home / Self Care | Payer: Self-pay | Source: Ambulatory Visit | Attending: Cardiovascular Disease

## 2021-09-21 ENCOUNTER — Ambulatory Visit
Admission: RE | Admit: 2021-09-21 | Discharge: 2021-09-21 | Disposition: A | Discharge status: Home / Self Care | Payer: Medicare Other | Source: Ambulatory Visit | Attending: Cardiovascular Disease | Admitting: Cardiovascular Disease

## 2021-09-21 ENCOUNTER — Encounter: Payer: Self-pay | Admitting: Cardiovascular Disease

## 2021-09-21 DIAGNOSIS — I200 Unstable angina: Secondary | ICD-10-CM | POA: Insufficient documentation

## 2021-09-21 HISTORY — PX: LEFT HEART CATH AND CORONARY ANGIOGRAPHY: CATH118249

## 2021-09-21 SURGERY — LEFT HEART CATH AND CORONARY ANGIOGRAPHY
Anesthesia: Moderate Sedation | Laterality: Right

## 2021-09-21 MED ORDER — SODIUM CHLORIDE 0.9 % WEIGHT BASED INFUSION: 1.0000 mL/kg/h | INTRAVENOUS | Status: DC

## 2021-09-21 MED ORDER — SODIUM CHLORIDE 0.9% FLUSH: 3.0000 mL | INTRAVENOUS | Status: DC | PRN

## 2021-09-21 MED ORDER — ASPIRIN 81 MG PO CHEW
81.0000 mg | CHEWABLE_TABLET | ORAL | Status: AC
  Administered 2021-09-21: 81 mg via ORAL

## 2021-09-21 MED ORDER — ACETAMINOPHEN 325 MG PO TABS
650.0000 mg | ORAL_TABLET | ORAL | Status: DC | PRN
Start: 2021-09-21 — End: 2021-09-21

## 2021-09-21 MED ORDER — IOHEXOL 300 MG/ML  SOLN
INTRAMUSCULAR | Status: DC | PRN
  Administered 2021-09-21: 42 mL

## 2021-09-21 MED ORDER — ACETAMINOPHEN 325 MG PO TABS: 650.0000 mg | ORAL_TABLET | ORAL | Status: DC | PRN

## 2021-09-21 MED ORDER — LIDOCAINE HCL 1 % IJ SOLN
INTRAMUSCULAR | Status: AC
  Filled 2021-09-21: qty 20

## 2021-09-21 MED ORDER — ONDANSETRON HCL 4 MG/2ML IJ SOLN: 4.0000 mg | Freq: Four times a day (QID) | INTRAMUSCULAR | Status: DC | PRN

## 2021-09-21 MED ORDER — SODIUM CHLORIDE 0.9 % IV SOLN: 250.0000 mL | INTRAVENOUS | Status: DC | PRN

## 2021-09-21 MED ORDER — HYDRALAZINE HCL 20 MG/ML IJ SOLN: 10.0000 mg | INTRAMUSCULAR | Status: DC | PRN

## 2021-09-21 MED ORDER — MIDAZOLAM HCL 2 MG/2ML IJ SOLN
INTRAMUSCULAR | Status: DC | PRN
  Administered 2021-09-21: 1 mg via INTRAVENOUS

## 2021-09-21 MED ORDER — HEPARIN SODIUM (PORCINE) 1000 UNIT/ML IJ SOLN
INTRAMUSCULAR | Status: DC | PRN
  Administered 2021-09-21: 4600 [IU] via INTRAVENOUS

## 2021-09-21 MED ORDER — ASPIRIN 81 MG PO CHEW
CHEWABLE_TABLET | ORAL | Status: AC
  Filled 2021-09-21: qty 1

## 2021-09-21 MED ORDER — VERAPAMIL HCL 2.5 MG/ML IV SOLN
INTRAVENOUS | Status: DC | PRN
  Administered 2021-09-21: 2.5 mg via INTRA_ARTERIAL

## 2021-09-21 MED ORDER — FENTANYL CITRATE (PF) 100 MCG/2ML IJ SOLN
INTRAMUSCULAR | Status: DC | PRN
  Administered 2021-09-21: 50 ug via INTRAVENOUS

## 2021-09-21 MED ORDER — HEPARIN (PORCINE) IN NACL 1000-0.9 UT/500ML-% IV SOLN
INTRAVENOUS | Status: AC
  Filled 2021-09-21: qty 1000

## 2021-09-21 MED ORDER — VERAPAMIL HCL 2.5 MG/ML IV SOLN
INTRAVENOUS | Status: AC
  Filled 2021-09-21: qty 2

## 2021-09-21 MED ORDER — LABETALOL HCL 5 MG/ML IV SOLN: 10.0000 mg | INTRAVENOUS | Status: DC | PRN

## 2021-09-21 MED ORDER — HEPARIN SODIUM (PORCINE) 1000 UNIT/ML IJ SOLN
INTRAMUSCULAR | Status: AC
  Filled 2021-09-21: qty 10

## 2021-09-21 MED ORDER — SODIUM CHLORIDE 0.9 % WEIGHT BASED INFUSION
3.0000 mL/kg/h | INTRAVENOUS | Status: DC
  Administered 2021-09-21: 3 mL/kg/h via INTRAVENOUS

## 2021-09-21 MED ORDER — SODIUM CHLORIDE 0.9% FLUSH: 3.0000 mL | Freq: Two times a day (BID) | INTRAVENOUS | Status: DC

## 2021-09-21 MED ORDER — HEPARIN (PORCINE) IN NACL 1000-0.9 UT/500ML-% IV SOLN
INTRAVENOUS | Status: DC | PRN
  Administered 2021-09-21 (×2): 500 mL

## 2021-09-21 MED ORDER — LIDOCAINE HCL (PF) 1 % IJ SOLN
INTRAMUSCULAR | Status: DC | PRN
  Administered 2021-09-21: 2 mL

## 2021-09-21 MED ORDER — FENTANYL CITRATE (PF) 100 MCG/2ML IJ SOLN
INTRAMUSCULAR | Status: AC
  Filled 2021-09-21: qty 2

## 2021-09-21 MED ORDER — MIDAZOLAM HCL 2 MG/2ML IJ SOLN
INTRAMUSCULAR | Status: AC
  Filled 2021-09-21: qty 2

## 2021-09-21 MED ORDER — SODIUM CHLORIDE 0.9 % IV SOLN
INTRAVENOUS | Status: DC | PRN
  Administered 2021-09-21: 250 mL via INTRAVENOUS

## 2021-09-21 SURGICAL SUPPLY — 11 items
BAND ZEPHYR COMPRESS 30 LONG (HEMOSTASIS) IMPLANT
CATH 5FR JL3.5 JR4 ANG PIG MP (CATHETERS) IMPLANT
CATH INFINITI 5FR JL4 (CATHETERS) IMPLANT
DRAPE BRACHIAL (DRAPES) IMPLANT
GLIDESHEATH SLEND SS 6F .021 (SHEATH) IMPLANT
GUIDEWIRE INQWIRE 1.5J.035X260 (WIRE) IMPLANT
INQWIRE 1.5J .035X260CM (WIRE) ×1
PACK CARDIAC CATH (CUSTOM PROCEDURE TRAY) ×1 IMPLANT
PROTECTION STATION PRESSURIZED (MISCELLANEOUS) ×1
SET ATX SIMPLICITY (MISCELLANEOUS) IMPLANT
STATION PROTECTION PRESSURIZED (MISCELLANEOUS) IMPLANT

## 2021-09-21 NOTE — Progress Notes (Signed)
No orders for TR band deflation. Messaged Dr. Welton Flakes. Await orders.

## 2021-09-21 NOTE — Progress Notes (Signed)
Received orders for TR band protocol.

## 2021-09-22 ENCOUNTER — Encounter: Payer: Self-pay | Admitting: Cardiovascular Disease

## 2021-09-28 DIAGNOSIS — I10 Essential (primary) hypertension: Secondary | ICD-10-CM | POA: Diagnosis not present

## 2021-09-28 DIAGNOSIS — I340 Nonrheumatic mitral (valve) insufficiency: Secondary | ICD-10-CM | POA: Diagnosis not present

## 2021-09-28 DIAGNOSIS — I2510 Atherosclerotic heart disease of native coronary artery without angina pectoris: Secondary | ICD-10-CM | POA: Diagnosis not present

## 2021-09-28 DIAGNOSIS — R55 Syncope and collapse: Secondary | ICD-10-CM | POA: Diagnosis not present

## 2021-09-28 DIAGNOSIS — R609 Edema, unspecified: Secondary | ICD-10-CM | POA: Diagnosis not present

## 2021-09-28 DIAGNOSIS — R0602 Shortness of breath: Secondary | ICD-10-CM | POA: Diagnosis not present

## 2021-09-28 DIAGNOSIS — E785 Hyperlipidemia, unspecified: Secondary | ICD-10-CM | POA: Diagnosis not present

## 2021-10-23 DIAGNOSIS — I2510 Atherosclerotic heart disease of native coronary artery without angina pectoris: Secondary | ICD-10-CM | POA: Diagnosis not present

## 2021-10-23 DIAGNOSIS — I10 Essential (primary) hypertension: Secondary | ICD-10-CM | POA: Diagnosis not present

## 2021-11-05 DIAGNOSIS — E785 Hyperlipidemia, unspecified: Secondary | ICD-10-CM | POA: Diagnosis not present

## 2021-11-05 DIAGNOSIS — I10 Essential (primary) hypertension: Secondary | ICD-10-CM | POA: Diagnosis not present

## 2021-11-05 DIAGNOSIS — M109 Gout, unspecified: Secondary | ICD-10-CM | POA: Diagnosis not present

## 2021-11-05 DIAGNOSIS — Z0001 Encounter for general adult medical examination with abnormal findings: Secondary | ICD-10-CM | POA: Diagnosis not present

## 2021-11-08 DIAGNOSIS — M7062 Trochanteric bursitis, left hip: Secondary | ICD-10-CM | POA: Diagnosis not present

## 2021-11-08 DIAGNOSIS — G4709 Other insomnia: Secondary | ICD-10-CM | POA: Diagnosis not present

## 2021-11-08 DIAGNOSIS — M109 Gout, unspecified: Secondary | ICD-10-CM | POA: Diagnosis not present

## 2021-11-08 DIAGNOSIS — M13831 Other specified arthritis, right wrist: Secondary | ICD-10-CM | POA: Diagnosis not present

## 2021-11-08 DIAGNOSIS — Z23 Encounter for immunization: Secondary | ICD-10-CM | POA: Diagnosis not present

## 2021-11-08 DIAGNOSIS — E785 Hyperlipidemia, unspecified: Secondary | ICD-10-CM | POA: Diagnosis not present

## 2021-11-29 DIAGNOSIS — I10 Essential (primary) hypertension: Secondary | ICD-10-CM | POA: Diagnosis not present

## 2021-11-29 DIAGNOSIS — Z0001 Encounter for general adult medical examination with abnormal findings: Secondary | ICD-10-CM | POA: Diagnosis not present

## 2021-11-29 DIAGNOSIS — M109 Gout, unspecified: Secondary | ICD-10-CM | POA: Diagnosis not present

## 2021-11-29 DIAGNOSIS — J449 Chronic obstructive pulmonary disease, unspecified: Secondary | ICD-10-CM | POA: Diagnosis not present

## 2021-11-29 DIAGNOSIS — E785 Hyperlipidemia, unspecified: Secondary | ICD-10-CM | POA: Diagnosis not present

## 2021-11-29 DIAGNOSIS — I2510 Atherosclerotic heart disease of native coronary artery without angina pectoris: Secondary | ICD-10-CM | POA: Diagnosis not present

## 2021-11-29 DIAGNOSIS — M13831 Other specified arthritis, right wrist: Secondary | ICD-10-CM | POA: Diagnosis not present

## 2021-11-29 DIAGNOSIS — M7062 Trochanteric bursitis, left hip: Secondary | ICD-10-CM | POA: Diagnosis not present

## 2022-01-22 DIAGNOSIS — I10 Essential (primary) hypertension: Secondary | ICD-10-CM | POA: Diagnosis not present

## 2022-01-22 DIAGNOSIS — I2510 Atherosclerotic heart disease of native coronary artery without angina pectoris: Secondary | ICD-10-CM | POA: Diagnosis not present

## 2022-01-31 DIAGNOSIS — R55 Syncope and collapse: Secondary | ICD-10-CM | POA: Diagnosis not present

## 2022-01-31 DIAGNOSIS — I340 Nonrheumatic mitral (valve) insufficiency: Secondary | ICD-10-CM | POA: Diagnosis not present

## 2022-01-31 DIAGNOSIS — E785 Hyperlipidemia, unspecified: Secondary | ICD-10-CM | POA: Diagnosis not present

## 2022-01-31 DIAGNOSIS — I2510 Atherosclerotic heart disease of native coronary artery without angina pectoris: Secondary | ICD-10-CM | POA: Diagnosis not present

## 2022-01-31 DIAGNOSIS — I10 Essential (primary) hypertension: Secondary | ICD-10-CM | POA: Diagnosis not present

## 2022-01-31 DIAGNOSIS — M109 Gout, unspecified: Secondary | ICD-10-CM | POA: Diagnosis not present

## 2022-02-04 DIAGNOSIS — J301 Allergic rhinitis due to pollen: Secondary | ICD-10-CM | POA: Diagnosis not present

## 2022-02-04 DIAGNOSIS — G4709 Other insomnia: Secondary | ICD-10-CM | POA: Diagnosis not present

## 2022-02-04 DIAGNOSIS — M13831 Other specified arthritis, right wrist: Secondary | ICD-10-CM | POA: Diagnosis not present

## 2022-02-04 DIAGNOSIS — E785 Hyperlipidemia, unspecified: Secondary | ICD-10-CM | POA: Diagnosis not present

## 2022-02-04 DIAGNOSIS — J449 Chronic obstructive pulmonary disease, unspecified: Secondary | ICD-10-CM | POA: Diagnosis not present

## 2022-02-04 DIAGNOSIS — M7062 Trochanteric bursitis, left hip: Secondary | ICD-10-CM | POA: Diagnosis not present

## 2022-03-09 ENCOUNTER — Telehealth: Payer: Self-pay

## 2022-03-09 NOTE — Patient Outreach (Signed)
  Care Coordination   Initial Visit Note   03/09/2022 Name: Gregory [name] MRN: [id number] DOB: 1945/04/12  Gregory [name] is a 77 y.o. year old male who sees Gregory Marble, MD for primary care. I spoke with  Gregory [name] by phone today.  What matters to the patients health and wellness today?  Patient verbalized no nursing or community resource needs.      Goals Addressed             This Visit's Progress    care coordination activities- no follow up needed       Care coordination program/ services discussed  Social determinants of health survey completed Annual wellness visit discussed and patient advised to contact provider office to schedule Patient advised to contact primary care provider office  if care coordination services needed in the future         SDOH assessments and interventions completed:  Yes  SDOH Interventions Today    Flowsheet Row Most Recent Value  SDOH Interventions   Food Insecurity Interventions Intervention Not Indicated  Housing Interventions Intervention Not Indicated  Transportation Interventions Intervention Not Indicated        Care Coordination Interventions:  Yes, provided   Follow up plan: No further intervention required.   Encounter Outcome:  Pt. Visit Completed   Gregory Plowman RN,BSN,CCM [place] [phone] direct line

## 2022-03-24 ENCOUNTER — Other Ambulatory Visit: Payer: Self-pay | Admitting: Internal Medicine

## 2022-03-24 DIAGNOSIS — I10 Essential (primary) hypertension: Secondary | ICD-10-CM | POA: Diagnosis not present

## 2022-03-24 DIAGNOSIS — I2510 Atherosclerotic heart disease of native coronary artery without angina pectoris: Secondary | ICD-10-CM | POA: Diagnosis not present

## 2022-03-24 DIAGNOSIS — M109 Gout, unspecified: Secondary | ICD-10-CM | POA: Diagnosis not present

## 2022-04-18 ENCOUNTER — Other Ambulatory Visit: Payer: Self-pay | Admitting: Cardiovascular Disease

## 2022-04-30 ENCOUNTER — Other Ambulatory Visit: Payer: Self-pay | Admitting: Internal Medicine

## 2022-05-01 ENCOUNTER — Other Ambulatory Visit: Payer: Self-pay | Admitting: Internal Medicine

## 2022-05-01 ENCOUNTER — Other Ambulatory Visit: Payer: Self-pay | Admitting: Cardiovascular Disease

## 2022-05-06 ENCOUNTER — Ambulatory Visit: Payer: 59 | Admitting: Internal Medicine

## 2022-05-22 ENCOUNTER — Other Ambulatory Visit: Payer: Self-pay | Admitting: Internal Medicine

## 2022-05-22 ENCOUNTER — Other Ambulatory Visit: Payer: Self-pay | Admitting: Cardiovascular Disease

## 2022-05-24 ENCOUNTER — Ambulatory Visit (INDEPENDENT_AMBULATORY_CARE_PROVIDER_SITE_OTHER): Payer: 59 | Admitting: Cardiovascular Disease

## 2022-05-24 ENCOUNTER — Encounter: Payer: Self-pay | Admitting: Cardiovascular Disease

## 2022-05-24 VITALS — BP 104/70 | HR 68 | Ht 64.0 in | Wt 202.0 lb

## 2022-05-24 DIAGNOSIS — I10 Essential (primary) hypertension: Secondary | ICD-10-CM

## 2022-05-24 DIAGNOSIS — R0602 Shortness of breath: Secondary | ICD-10-CM

## 2022-05-24 DIAGNOSIS — I340 Nonrheumatic mitral (valve) insufficiency: Secondary | ICD-10-CM | POA: Insufficient documentation

## 2022-05-24 DIAGNOSIS — E785 Hyperlipidemia, unspecified: Secondary | ICD-10-CM | POA: Diagnosis not present

## 2022-05-24 DIAGNOSIS — I2510 Atherosclerotic heart disease of native coronary artery without angina pectoris: Secondary | ICD-10-CM | POA: Diagnosis not present

## 2022-05-24 MED ORDER — SPIRONOLACTONE 25 MG PO TABS: 25.0000 mg | ORAL_TABLET | Freq: Every day | ORAL | 2 refills | Status: DC

## 2022-05-24 NOTE — Assessment & Plan Note (Signed)
Patient complains of shortness of breath on exertion. Grade I DD on echo 08/2021. Will add spironolactone. 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 If shortness of breath continues, will consider cardiac cath.

## 2022-05-24 NOTE — Assessment & Plan Note (Signed)
Well controlled. Continue same medications. 

## 2022-05-24 NOTE — Assessment & Plan Note (Signed)
Denies chest pain,shortness of breath on exertion. CCTA 08/2021 RCA is ok. LAD calcified with minor irregularities. 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

## 2022-05-24 NOTE — Progress Notes (Signed)
Cardiology Office Note   Date:  05/24/2022   ID:  Gregory, [name] 1945-06-19, MRN [id number]  PCP:  Sherron Monday, MD  Cardiologist:  Adrian Blackwater, MD      History of Present Illness: Gregory [name] is a 77 y.o. male who presents for  Chief Complaint  Patient presents with   Follow-up    3 month follow up    Patient in office for routine cardiac exam. Denies chest pain, edema, palpitations. Complains of shortness of breath on exertion.     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   Pneumothorax    Associated with pneumonia     Past Surgical History:  Procedure Laterality Date   Arm surgery     CARDIAC CATHETERIZATION N/A 12/05/2014   Procedure: Left Heart Cath and Coronary Angiography;  Surgeon: Lyn Records, MD;  Location: [facility] INVASIVE CV LAB;  Service: Cardiovascular;  Laterality: N/A;   CATARACT EXTRACTION W/PHACO Right 01/14/2013   Procedure: RIGHT EYE CATARACT EXTRACTION PHACO AND INTRAOCULAR LENS PLACEMENT ;  Surgeon: Gemma Payor, MD;  Location: [facility];  Service: Ophthalmology;  Laterality: Right;  CDE 54.97   CATARACT EXTRACTION W/PHACO Left 02/18/2013   Procedure: CATARACT EXTRACTION PHACO AND INTRAOCULAR LENS PLACEMENT (IOC);  Surgeon: Gemma Payor, MD;  Location: [facility];  Service: Ophthalmology;  Laterality: Left;  CDE 34.02   COLONOSCOPY WITH PROPOFOL N/A 07/13/2016   Procedure: COLONOSCOPY WITH PROPOFOL;  Surgeon: Scot Jun, MD;  Location: [facility] ENDOSCOPY;  Service: Endoscopy;  Laterality: N/A;   INGUINAL HERNIA REPAIR Right 09/09/2012   Procedure: HERNIA REPAIR INGUINAL INCARCERATED;  Surgeon: Kandis Cocking, MD;  Location: [facility];  Service: General;  Laterality: Right;   INSERTION OF MESH Right 09/09/2012   Procedure: INSERTION OF MESH;  Surgeon: Kandis Cocking, MD;  Location: [facility];  Service: General;  Laterality: Right;   LEFT HEART CATH AND  CORONARY ANGIOGRAPHY Right 09/21/2021   Procedure: LEFT HEART CATH AND CORONARY ANGIOGRAPHY with intervention;  Surgeon: Laurier Nancy, MD;  Location: [facility] INVASIVE CV LAB;  Service: Cardiovascular;  Laterality: Right;     Current Outpatient Medications  Medication Sig Dispense Refill   acetaminophen (TYLENOL) 500 MG tablet Take 1,500 mg by mouth every 6 (six) hours as needed for moderate pain.     allopurinol (ZYLOPRIM) 100 MG tablet Take 1 tablet by mouth once daily 90 tablet 0   aspirin 81 MG chewable tablet Chew 1 tablet (81 mg total) by mouth daily.     BELSOMRA 10 MG TABS Take 1 tablet by mouth at bedtime.     BREZTRI AEROSPHERE 160-9-4.8 MCG/ACT AERO Inhale 2 puffs into the lungs 2 (two) times daily.     celecoxib (CELEBREX) 200 MG capsule Take 200-400 mg by mouth daily as needed.     cetirizine (ZYRTEC) 10 MG tablet Take 10 mg by mouth every morning.     clopidogrel (PLAVIX) 75 MG tablet Take 1 tablet by mouth once daily 90 tablet 0   Colchicine 0.6 MG CAPS Take 1 capsule by mouth daily.     COMBIVENT RESPIMAT 20-100 MCG/ACT AERS respimat Inhale 1 puff into the lungs every 6 (six) hours as needed for shortness of breath.     fluticasone (FLONASE) 50 MCG/ACT nasal spray Place 1 spray into both nostrils 2 (two) times daily.  furosemide (LASIX) 20 MG tablet Take 1 tablet by mouth once daily 90 tablet 0   isosorbide mononitrate (IMDUR) 30 MG 24 hr tablet Take 1 tablet by mouth once daily 90 tablet 0   lisinopril (ZESTRIL) 40 MG tablet Take 1 tablet by mouth once daily 90 tablet 0   metoprolol tartrate (LOPRESSOR) 25 MG tablet TAKE 1/2 (ONE-HALF) TABLET BY MOUTH TWICE DAILY AS DIRECTED 90 tablet 0   predniSONE (DELTASONE) 10 MG tablet 6, 5, 4, 3, 2 then 1 tablet by mouth daily for 6 days total. 21 tablet 0   rosuvastatin (CRESTOR) 40 MG tablet Take 1 tablet by mouth in the evening 90 tablet 0   spironolactone (ALDACTONE) 25 MG tablet Take 1 tablet (25 mg total) by mouth daily. 30  tablet 2   traZODone (DESYREL) 50 MG tablet Take 1 tablet (50 mg total) by mouth at bedtime as needed for sleep. 30 tablet 2   No current facility-administered medications for this visit.    Allergies:   Patient has no known allergies.    Social History:   reports that he quit smoking about 44 years ago. His smoking use included cigarettes. He has never used smokeless tobacco. He reports that he does not drink alcohol and does not use drugs.   Family History:  family history includes COPD in his father; Stroke in his brother.    ROS:     Review of Systems  Constitutional: Negative.   HENT: Negative.    Eyes: Negative.   Respiratory: Negative.    Cardiovascular: Negative.   Gastrointestinal: Negative.   Genitourinary: Negative.   Musculoskeletal: Negative.   Skin: Negative.   Neurological: Negative.   Endo/Heme/Allergies: Negative.   Psychiatric/Behavioral: Negative.    All other systems reviewed and are negative.   All other systems are reviewed and negative.   PHYSICAL EXAM: VS:  BP 104/70   Pulse 68   Ht 5\' 4"  (1.626 m)   Wt 202 lb (91.6 kg)   SpO2 96%   BMI 34.67 kg/m  , BMI Body mass index is 34.67 kg/m. Last weight:  Wt Readings from Last 3 Encounters:  05/24/22 202 lb (91.6 kg)  09/21/21 208 lb (94.3 kg)  02/24/21 198 lb (89.8 kg)   Physical Exam Vitals reviewed.  Constitutional:      Appearance: Normal appearance. He is normal weight.  HENT:     Head: Normocephalic.     Nose: Nose normal.     Mouth/Throat:     Mouth: Mucous membranes are moist.  Eyes:     Pupils: Pupils are equal, round, and reactive to light.  Cardiovascular:     Rate and Rhythm: Normal rate and regular rhythm.     Pulses: Normal pulses.     Heart sounds: Normal heart sounds.  Pulmonary:     Effort: Pulmonary effort is normal.  Abdominal:     General: Abdomen is flat. Bowel sounds are normal.  Musculoskeletal:        General: Normal range of motion.     Cervical back: Normal  range of motion.  Skin:    General: Skin is warm.  Neurological:     General: No focal deficit present.     Mental Status: He is alert.  Psychiatric:        Mood and Affect: Mood normal.     EKG: none today  Recent Labs: No results found for requested labs within last 365 days.    Lipid Panel  Component Value Date/Time   CHOL 117 12/05/2014 0709   TRIG 105 12/05/2014 0709   HDL 30 (L) 12/05/2014 0709   CHOLHDL 3.9 12/05/2014 0709   VLDL 21 12/05/2014 0709   LDLCALC 66 12/05/2014 0709      Other studies Reviewed: Patient: [id number] - Arneta Cliche DOB:  01/16/1946  Date:  09/14/2021 09:30 Provider: Adrian Blackwater MD Encounter: ALL ANGIOGRAMS (CTA BRAIN, CAROTIDS, RENAL ARTERIES, PE)   Page 1 REASON FOR VISIT  Referred by Dr.Greer Wainright [place].   TESTS  Imaging: Computed Tomographic Angiography:  Cardiac multidetector CT was performed paying particular attention to the coronary arteries for the diagnosis of: CAD. Diagnostic Drugs:  Administered iohexol (Omnipaque) through an antecubital vein and images from the examination were analyzed for the presence and extent of coronary artery disease, using 3D image processing software. 100 mL of non-ionic contrast (Omnipaque) was used.     TEST CONCLUSIONS  Quality of study: Fair.  1-Calcium score: 1168.5  2-Right dominant system.  3-RCA is ok. LAD calcified with minor irregularities. 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   Adrian Blackwater MD  Electronically signed by: Adrian Blackwater     Date: 09/21/2021 09:06  Patient: [id number] - Arneta Cliche DOB:  [birth year]  Date:  08/30/2021 11:30 Provider: Adrian Blackwater MD Encounter: ECHO   Page 2 REASON FOR VISIT  Visit for: Echocardiogram/R06.02  Sex:   Male   wt=213    lbs.  BP=122/74  Height= 64   inches.   TESTS  Imaging: Echocardiogram:  An echocardiogram in (2-d) mode was performed and in Doppler mode with color flow velocity mapping was performed. The  aortic valve cusps are abnormal 1.4  cm, flow velocity 1.4   m/s, and systolic calculated mean flow gradient 5 mmHg. Mitral valve diastolic peak flow velocity E .637  m/s and E/A ratio 0.6. Aortic root diameter 3.7  cm. The LVOT internal diameter 3.3  cm and flow velocity was abnormal .85   m/s. LV systolic dimension 2.24   cm, diastolic 3.66 cm, posterior wall thickness 1.55    cm, fractional shortening 38.8  %, and EF 70 %. IVS thickness 1.41  cm. LA dimension 4.5 cm. Mitral Valve has Mild Regurgitation. Tricuspid Valve has Trace Regurgitation.  ASSESSMENT  Technically adequate study.  Normal chamber sizes.  Normal left ventricular systolic function.  Mild left ventricular hypertrophy with GRADE 1 (relaxation abnormality) diastolic dysfunction.  Normal right ventricular systolic function.  Normal right ventricular diastolic function.  Normal left ventricular wall motion.  Normal right ventricular wall motion.  Trace tricuspid regurgitation.  Normal pulmonary artery pressure.  Mild mitral regurgitation.  No pericardial effusion.  Mildly dilated Left atrium   Moderately dilated Right and Left ventricle  Mild LVH.   THERAPY   Referring physician: Laurier Nancy  Sonographer: Adrian Blackwater.   Adrian Blackwater MD  Electronically signed by: Adrian Blackwater     Date: 08/31/2021 09:03  Patient: [id number] - Arneta Cliche DOB:  29-Dec-1945  Date:  08/25/2021 08:00 Provider: Adrian Blackwater MD Encounter: NUCLEAR STRESS TEST   Page 1 TESTS    [facility] [address] [place], [place] [address] [phone] STUDY:  Gated Stress / Rest Myocardial Perfusion Imaging Tomographic (SPECT) Including attenuation correction Wall Motion, Left Ventricular Ejection Fraction By Gated Technique.Persantine Stress Test. SEX: Male   WEIGHT: 213 lbs  HEIGHT: 64 in    ARMS UP: YES/NO  REFERRING PHYSICIAN: Dr.Mikela Senn [place]                                                                                                                                                                                                                       INDICATION FOR STUDY: SOB                                                                                                                                                                                                                    TECHNIQUE:  Approximately 20 minutes following the intravenous administration of 10.5 mCi of Tc-61m Sestamibi after stress testing in a reclined supine position with arms above their head if able to do so, gated SPECT imaging of the heart was performed. After about a 2hr break, the patient was injected intravenously with 32.6 mCi of Tc-56m Sestamibi.  Approximately 45 minutes later in the same position as stress imaging SPECT rest imaging of the heart was performed.  STRESS BY:  Adrian Blackwater, MD PROTOCOL:  Persantine   DOSE ADMIN: 10 CC   ROUTE OF ADMINISTRATION: IV  MAX PRED HR: 147                     85%: 125               75%: 110                                                                                                                   RESTING BP: 130/70   RESTING HR: 55  PEAK BP: 118/68  PEAK HR: 83                                                                    EXERCISE DURATION:    4 min injection                                            REASON FOR TEST TERMINATION:    Protocol end                                                                                                                              SYMPTOMS:   None  EKG RESULTS: Baseline. NSR. 83/min. No significant ST changes with persantine.                                                              IMAGE QUALITY: Good                                                                                                                                                                                                                                                                                                                                  PERFUSION/WALL MOTION FINDINGS: EF = 89%. Small mild reversible basal and mid anteroseptal, basal, mid and apical inferior and apex (17) wall defects, normal wall motion.                                                                           IMPRESSION:  Ischemia in the RCA/LAD territories with normal LVEF, advise CCTA.  Adrian Blackwater, MD Stress Interpreting Physician / Nuclear Interpreting Physician  Adrian Blackwater MD  Electronically signed by: Adrian Blackwater     Date: 09/02/2021 11:05   ASSESSMENT AND PLAN:    ICD-10-CM   1. SOB (shortness of breath)  R06.02 spironolactone (ALDACTONE) 25 MG tablet    2. CAD, multiple vessel  I25.10     3. Essential hypertension  I10     4. Dyslipidemia  E78.5     5. Nonrheumatic mitral valve regurgitation  I34.0        Problem List Items Addressed This Visit       Cardiovascular and Mediastinum   CAD, multiple vessel    Denies chest pain,shortness of breath on exertion. CCTA 08/2021 RCA is ok. LAD calcified with minor irregularities. 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      Relevant Medications   spironolactone (ALDACTONE) 25 MG tablet    Essential hypertension    Well controlled. Continue same medications.       Relevant Medications   spironolactone (ALDACTONE) 25 MG tablet   Nonrheumatic mitral valve regurgitation   Relevant Medications   spironolactone (ALDACTONE) 25 MG tablet     Other   Dyslipidemia   SOB (shortness of breath) - Primary    Patient complains of shortness of breath on exertion. Grade I DD on echo 08/2021. Will add spironolactone. 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 If shortness of breath continues, will consider cardiac cath.       Relevant Medications   spironolactone (ALDACTONE) 25 MG tablet    Disposition:   Return in about 4 weeks (around 06/21/2022).    Total time spent: 30 minutes  Signed,  Adrian Blackwater, MD  05/24/2022 1:30 PM    [facility]

## 2022-05-25 ENCOUNTER — Telehealth: Payer: Self-pay | Admitting: Internal Medicine

## 2022-05-25 NOTE — Telephone Encounter (Signed)
Marchelle Folks from [facility] left VM stating that the spironolactone that was sent in is showing up to have a drug interaction with the lisinopril the patient is on. She said that he needs to have his potassium monitored while taking these together and prior to filling the spironolactone.

## 2022-05-30 ENCOUNTER — Encounter: Payer: Self-pay | Admitting: Internal Medicine

## 2022-05-30 ENCOUNTER — Ambulatory Visit (INDEPENDENT_AMBULATORY_CARE_PROVIDER_SITE_OTHER): Payer: 59 | Admitting: Internal Medicine

## 2022-05-30 VITALS — BP 104/68 | HR 94 | Ht 64.0 in | Wt 202.0 lb

## 2022-05-30 DIAGNOSIS — I10 Essential (primary) hypertension: Secondary | ICD-10-CM

## 2022-05-30 DIAGNOSIS — E785 Hyperlipidemia, unspecified: Secondary | ICD-10-CM

## 2022-05-30 DIAGNOSIS — R5381 Other malaise: Secondary | ICD-10-CM | POA: Diagnosis not present

## 2022-05-30 DIAGNOSIS — I2510 Atherosclerotic heart disease of native coronary artery without angina pectoris: Secondary | ICD-10-CM

## 2022-05-30 NOTE — Progress Notes (Signed)
Established Patient Office Visit  Subjective:  Patient ID: Gregory [name], male    DOB: 04/23/1945  Age: 77 y.o. MRN: [id number]  Chief Complaint  Patient presents with   Follow-up    3 month follow up    No new complaints however states he'Jae Bruck more anxious than usual. Also c/o having difficulty performing ADLs and requests [facility] aide.     No other concerns at this time.   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   Pneumothorax    Associated with pneumonia    Past Surgical History:  Procedure Laterality Date   Arm surgery     CARDIAC CATHETERIZATION N/A 12/05/2014   Procedure: Left Heart Cath and Coronary Angiography;  Surgeon: Lyn Records, MD;  Location: [facility] INVASIVE CV LAB;  Service: Cardiovascular;  Laterality: N/A;   CATARACT EXTRACTION W/PHACO Right 01/14/2013   Procedure: RIGHT EYE CATARACT EXTRACTION PHACO AND INTRAOCULAR LENS PLACEMENT ;  Surgeon: Gemma Payor, MD;  Location: [facility];  Service: Ophthalmology;  Laterality: Right;  CDE 54.97   CATARACT EXTRACTION W/PHACO Left 02/18/2013   Procedure: CATARACT EXTRACTION PHACO AND INTRAOCULAR LENS PLACEMENT (IOC);  Surgeon: Gemma Payor, MD;  Location: [facility];  Service: Ophthalmology;  Laterality: Left;  CDE 34.02   COLONOSCOPY WITH PROPOFOL N/A 07/13/2016   Procedure: COLONOSCOPY WITH PROPOFOL;  Surgeon: Scot Jun, MD;  Location: [facility] ENDOSCOPY;  Service: Endoscopy;  Laterality: N/A;   INGUINAL HERNIA REPAIR Right 09/09/2012   Procedure: HERNIA REPAIR INGUINAL INCARCERATED;  Surgeon: Kandis Cocking, MD;  Location: [facility];  Service: General;  Laterality: Right;   INSERTION OF MESH Right 09/09/2012   Procedure: INSERTION OF MESH;  Surgeon: Kandis Cocking, MD;  Location: [facility];  Service: General;  Laterality: Right;   LEFT HEART CATH AND CORONARY ANGIOGRAPHY Right 09/21/2021   Procedure: LEFT HEART CATH AND CORONARY ANGIOGRAPHY  with intervention;  Surgeon: Laurier Nancy, MD;  Location: [facility] INVASIVE CV LAB;  Service: Cardiovascular;  Laterality: Right;    Social History   Socioeconomic History   Marital status: Widowed    Spouse name: Not on file   Number of children: 2   Years of education: Not on file   Highest education level: Not on file  Occupational History   Not on file  Tobacco Use   Smoking status: Former    Types: Cigarettes    Quit date: 01/28/1978    Years since quitting: 44.3   Smokeless tobacco: Never  Vaping Use   Vaping Use: Never used  Substance and Sexual Activity   Alcohol use: No    Alcohol/week: 0.0 standard drinks of alcohol   Drug use: No   Sexual activity: Not on file  Other Topics Concern   Not on file  Social History Narrative   Wife passed approx. 4 years ago. Has 2 daughters. Lives by himself now.    Social Determinants of Health   Financial Resource Strain: Not on file  Food Insecurity: No Food Insecurity (03/09/2022)   Hunger Vital Sign    Worried About Running Out of Food in the Last Year: Never true    Ran Out of Food in the Last Year: Never true  Transportation Needs: No Transportation Needs (03/09/2022)   PRAPARE - Administrator, Civil Service (Medical): No    Lack of Transportation (Non-Medical): No  Physical Activity: Not on file  Stress: Not on file  Social Connections: Not on file  Intimate Partner Violence: Not on file    Family History  Problem Relation Age of Onset   COPD Father    Stroke Brother     No Known Allergies  Review of Systems  Constitutional: Negative.   HENT: Negative.    Eyes: Negative.   Respiratory: Negative.    Cardiovascular: Negative.   Gastrointestinal: Negative.   Genitourinary: Negative.   Musculoskeletal:  Positive for back pain and joint pain.  Skin: Negative.   Neurological: Negative.   Endo/Heme/Allergies: Negative.        Objective:   BP 104/68   Pulse 94   Ht 5\' 4"  (1.626 m)   Wt 202 lb  (91.6 kg)   SpO2 97%   BMI 34.67 kg/m   Vitals:   05/30/22 1446  BP: 104/68  Pulse: 94  Height: 5\' 4"  (1.626 m)  Weight: 202 lb (91.6 kg)  SpO2: 97%  BMI (Calculated): 34.66    Physical Exam Vitals reviewed.  Constitutional:      Appearance: Normal appearance.  HENT:     Head: Normocephalic.     Left Ear: There is no impacted cerumen.     Nose: Nose normal.     Mouth/Throat:     Mouth: Mucous membranes are moist.     Pharynx: No posterior oropharyngeal erythema.  Eyes:     Extraocular Movements: Extraocular movements intact.     Pupils: Pupils are equal, round, and reactive to light.  Cardiovascular:     Rate and Rhythm: Regular rhythm.     Chest Wall: PMI is not displaced.     Pulses: Normal pulses.     Heart sounds: Normal heart sounds. No murmur heard. Pulmonary:     Effort: Pulmonary effort is normal.     Breath sounds: Normal air entry. No rhonchi or rales.  Abdominal:     General: Abdomen is flat. Bowel sounds are normal. There is no distension.     Palpations: Abdomen is soft. There is no hepatomegaly, splenomegaly or mass.     Tenderness: There is no abdominal tenderness.  Musculoskeletal:        General: Normal range of motion.     Cervical back: Normal range of motion and neck supple.     Right lower leg: No edema.     Left lower leg: No edema.  Skin:    General: Skin is warm and dry.  Neurological:     General: No focal deficit present.     Mental Status: He is alert and oriented to person, place, and time.     Cranial Nerves: No cranial nerve deficit.     Motor: No weakness.  Psychiatric:        Mood and Affect: Mood normal.        Behavior: Behavior normal.      No results found for any visits on 05/30/22.  No results found for this or any previous visit (from the past 2160 hour(Oaklynn Stierwalt)).    Assessment & Plan:   Problem List Items Addressed This Visit   None   No follow-ups on file.   Total time spent: 20 minutes  Luna Fuse, MD  05/30/2022

## 2022-06-01 ENCOUNTER — Telehealth: Payer: Self-pay

## 2022-06-01 DIAGNOSIS — R5381 Other malaise: Secondary | ICD-10-CM

## 2022-06-01 NOTE — Telephone Encounter (Signed)
Can you put in an order for the home health aide for the patient and make sure its documented in his last office note

## 2022-06-10 NOTE — Addendum Note (Signed)
Addended by: Aundria Mems AHMAD on: 06/10/2022 04:51 PM   Modules accepted: Orders

## 2022-06-23 ENCOUNTER — Ambulatory Visit (INDEPENDENT_AMBULATORY_CARE_PROVIDER_SITE_OTHER): Payer: 59 | Admitting: Cardiovascular Disease

## 2022-06-23 ENCOUNTER — Encounter: Payer: Self-pay | Admitting: Cardiovascular Disease

## 2022-06-23 ENCOUNTER — Other Ambulatory Visit: Payer: Self-pay | Admitting: Internal Medicine

## 2022-06-23 VITALS — BP 104/68 | HR 73 | Ht 64.0 in | Wt 198.0 lb

## 2022-06-23 DIAGNOSIS — I340 Nonrheumatic mitral (valve) insufficiency: Secondary | ICD-10-CM

## 2022-06-23 DIAGNOSIS — I10 Essential (primary) hypertension: Secondary | ICD-10-CM

## 2022-06-23 DIAGNOSIS — E785 Hyperlipidemia, unspecified: Secondary | ICD-10-CM | POA: Diagnosis not present

## 2022-06-23 DIAGNOSIS — R0602 Shortness of breath: Secondary | ICD-10-CM | POA: Diagnosis not present

## 2022-06-23 DIAGNOSIS — I2510 Atherosclerotic heart disease of native coronary artery without angina pectoris: Secondary | ICD-10-CM | POA: Diagnosis not present

## 2022-06-23 NOTE — Progress Notes (Signed)
Cardiology Office Note   Date:  06/23/2022   ID:  Macauley, [place] 1945-03-29, MRN [id number]  PCP:  Sherron Monday, MD  Cardiologist:  Adrian Blackwater, MD      History of Present Illness: Gregory [name] is a 77 y.o. male who presents for  Chief Complaint  Patient presents with   Follow-up    1 month follow up    SOB, getting better., met c as on aldactone, having it next week  Shortness of Breath This is a chronic problem. The current episode started more than 1 month ago. The problem has been waxing and waning.      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   Pneumothorax    Associated with pneumonia     Past Surgical History:  Procedure Laterality Date   Arm surgery     CARDIAC CATHETERIZATION N/A 12/05/2014   Procedure: Left Heart Cath and Coronary Angiography;  Surgeon: Lyn Records, MD;  Location: [facility] INVASIVE CV LAB;  Service: Cardiovascular;  Laterality: N/A;   CATARACT EXTRACTION W/PHACO Right 01/14/2013   Procedure: RIGHT EYE CATARACT EXTRACTION PHACO AND INTRAOCULAR LENS PLACEMENT ;  Surgeon: Gemma Payor, MD;  Location: [facility];  Service: Ophthalmology;  Laterality: Right;  CDE 54.97   CATARACT EXTRACTION W/PHACO Left 02/18/2013   Procedure: CATARACT EXTRACTION PHACO AND INTRAOCULAR LENS PLACEMENT (IOC);  Surgeon: Gemma Payor, MD;  Location: [facility];  Service: Ophthalmology;  Laterality: Left;  CDE 34.02   COLONOSCOPY WITH PROPOFOL N/A 07/13/2016   Procedure: COLONOSCOPY WITH PROPOFOL;  Surgeon: Scot Jun, MD;  Location: [facility] ENDOSCOPY;  Service: Endoscopy;  Laterality: N/A;   INGUINAL HERNIA REPAIR Right 09/09/2012   Procedure: HERNIA REPAIR INGUINAL INCARCERATED;  Surgeon: Kandis Cocking, MD;  Location: [facility];  Service: General;  Laterality: Right;   INSERTION OF MESH Right 09/09/2012   Procedure: INSERTION OF MESH;  Surgeon: Kandis Cocking, MD;   Location: [facility];  Service: General;  Laterality: Right;   LEFT HEART CATH AND CORONARY ANGIOGRAPHY Right 09/21/2021   Procedure: LEFT HEART CATH AND CORONARY ANGIOGRAPHY with intervention;  Surgeon: Laurier Nancy, MD;  Location: [facility] INVASIVE CV LAB;  Service: Cardiovascular;  Laterality: Right;     Current Outpatient Medications  Medication Sig Dispense Refill   acetaminophen (TYLENOL) 500 MG tablet Take 1,500 mg by mouth every 6 (six) hours as needed for moderate pain.     allopurinol (ZYLOPRIM) 100 MG tablet Take 1 tablet by mouth once daily 90 tablet 0   aspirin 81 MG chewable tablet Chew 1 tablet (81 mg total) by mouth daily.     BELSOMRA 10 MG TABS Take 1 tablet by mouth at bedtime.     BREZTRI AEROSPHERE 160-9-4.8 MCG/ACT AERO Inhale 2 puffs into the lungs 2 (two) times daily.     celecoxib (CELEBREX) 200 MG capsule Take 200-400 mg by mouth daily as needed.     cetirizine (ZYRTEC) 10 MG tablet Take 10 mg by mouth every morning.     clopidogrel (PLAVIX) 75 MG tablet Take 1 tablet by mouth once daily 90 tablet 0   Colchicine 0.6 MG CAPS Take 1 capsule by mouth daily.     COMBIVENT RESPIMAT 20-100 MCG/ACT AERS respimat Inhale 1 puff into the lungs every 6 (six) hours as needed for shortness of breath.  fluticasone (FLONASE) 50 MCG/ACT nasal spray Place 1 spray into both nostrils 2 (two) times daily.     furosemide (LASIX) 20 MG tablet Take 1 tablet by mouth once daily 90 tablet 0   isosorbide mononitrate (IMDUR) 30 MG 24 hr tablet Take 1 tablet by mouth once daily 90 tablet 0   lisinopril (ZESTRIL) 40 MG tablet Take 1 tablet by mouth once daily 90 tablet 0   metoprolol tartrate (LOPRESSOR) 25 MG tablet TAKE 1/2 (ONE-HALF) TABLET BY MOUTH TWICE DAILY AS DIRECTED 90 tablet 0   rosuvastatin (CRESTOR) 40 MG tablet Take 1 tablet by mouth in the evening 90 tablet 0   spironolactone (ALDACTONE) 25 MG tablet Take 1 tablet (25 mg total) by mouth daily. 30 tablet 2   traZODone (DESYREL) 50 MG  tablet Take 1 tablet (50 mg total) by mouth at bedtime as needed for sleep. 30 tablet 2   No current facility-administered medications for this visit.    Allergies:   Patient has no known allergies.    Social History:   reports that he quit smoking about 44 years ago. His smoking use included cigarettes. He has never used smokeless tobacco. He reports that he does not drink alcohol and does not use drugs.   Family History:  family history includes COPD in his father; Stroke in his brother.    ROS:     Review of Systems  Constitutional: Negative.   HENT: Negative.    Eyes: Negative.   Respiratory:  Positive for shortness of breath.   Gastrointestinal: Negative.   Genitourinary: Negative.   Musculoskeletal: Negative.   Skin: Negative.   Neurological: Negative.   Endo/Heme/Allergies: Negative.   Psychiatric/Behavioral: Negative.    All other systems reviewed and are negative.     All other systems are reviewed and negative.    PHYSICAL EXAM: VS:  BP 104/68   Pulse 73   Ht 5\' 4"  (1.626 m)   Wt 198 lb (89.8 kg)   SpO2 97%   BMI 33.99 kg/m  , BMI Body mass index is 33.99 kg/m. Last weight:  Wt Readings from Last 3 Encounters:  06/23/22 198 lb (89.8 kg)  05/30/22 202 lb (91.6 kg)  05/24/22 202 lb (91.6 kg)     Physical Exam Vitals reviewed.  Constitutional:      Appearance: Normal appearance. He is normal weight.  HENT:     Head: Normocephalic.     Nose: Nose normal.     Mouth/Throat:     Mouth: Mucous membranes are moist.  Eyes:     Pupils: Pupils are equal, round, and reactive to light.  Cardiovascular:     Rate and Rhythm: Normal rate and regular rhythm.     Pulses: Normal pulses.     Heart sounds: Normal heart sounds.  Pulmonary:     Effort: Pulmonary effort is normal.  Abdominal:     General: Abdomen is flat. Bowel sounds are normal.  Musculoskeletal:        General: Normal range of motion.     Cervical back: Normal range of motion.  Skin:     General: Skin is warm.  Neurological:     General: No focal deficit present.     Mental Status: He is alert.  Psychiatric:        Mood and Affect: Mood normal.       EKG:   Recent Labs: No results found for requested labs within last 365 days.    Lipid Panel  Component Value Date/Time   CHOL 117 12/05/2014 0709   TRIG 105 12/05/2014 0709   HDL 30 (L) 12/05/2014 0709   CHOLHDL 3.9 12/05/2014 0709   VLDL 21 12/05/2014 0709   LDLCALC 66 12/05/2014 0709      Other studies Reviewed: Additional studies/ records that were reviewed today include:  Review of the above records demonstrates:       No data to display            ASSESSMENT AND PLAN:    ICD-10-CM   1. SOB (shortness of breath)  R06.02    has less SOB after starting aldactone, labs next week. Has HEpEF.    2. CAD, multiple vessel  I25.10     3. Essential hypertension  I10     4. Nonrheumatic mitral valve regurgitation  I34.0     5. Dyslipidemia  E78.5        Problem List Items Addressed This Visit       Cardiovascular and Mediastinum   CAD, multiple vessel   Essential hypertension   Nonrheumatic mitral valve regurgitation     Other   Dyslipidemia   SOB (shortness of breath) - Primary       Disposition:   Return in about 3 months (around 09/23/2022).    Total time spent: 30 minutes  Signed,  Adrian Blackwater, MD  06/23/2022 10:25 AM    [facility]

## 2022-07-08 ENCOUNTER — Other Ambulatory Visit: Payer: Self-pay | Admitting: Internal Medicine

## 2022-07-08 ENCOUNTER — Other Ambulatory Visit: Payer: 59

## 2022-07-08 DIAGNOSIS — E785 Hyperlipidemia, unspecified: Secondary | ICD-10-CM | POA: Diagnosis not present

## 2022-07-08 DIAGNOSIS — M109 Gout, unspecified: Secondary | ICD-10-CM | POA: Diagnosis not present

## 2022-07-09 LAB — CMP14+EGFR
ALT: 39 IU/L (ref 0–44)
AST: 43 IU/L — ABNORMAL HIGH (ref 0–40)
Albumin: 4.4 g/dL (ref 3.8–4.8)
Alkaline Phosphatase: 72 IU/L (ref 44–121)
BUN/Creatinine Ratio: 16 (ref 10–24)
BUN: 45 mg/dL — ABNORMAL HIGH (ref 8–27)
Bilirubin Total: 1.2 mg/dL (ref 0.0–1.2)
CO2: 17 mmol/L — ABNORMAL LOW (ref 20–29)
Calcium: 10.1 mg/dL (ref 8.6–10.2)
Chloride: 104 mmol/L (ref 96–106)
Creatinine, Ser: 2.88 mg/dL — ABNORMAL HIGH (ref 0.76–1.27)
Globulin, Total: 2.9 g/dL (ref 1.5–4.5)
Glucose: 90 mg/dL (ref 70–99)
Potassium: 6.1 mmol/L — ABNORMAL HIGH (ref 3.5–5.2)
Sodium: 135 mmol/L (ref 134–144)
Total Protein: 7.3 g/dL (ref 6.0–8.5)
eGFR: 22 mL/min/{1.73_m2} — ABNORMAL LOW (ref >59)

## 2022-07-09 LAB — URIC ACID: Uric Acid: 6.4 mg/dL (ref 3.8–8.4)

## 2022-07-09 LAB — LIPID PANEL W/O CHOL/HDL RATIO
Cholesterol, Total: 102 mg/dL (ref 100–199)
HDL: 37 mg/dL — ABNORMAL LOW (ref >39)
LDL Chol Calc (NIH): 35 mg/dL (ref 0–99)
Triglycerides: 183 mg/dL — ABNORMAL HIGH (ref 0–149)
VLDL Cholesterol Cal: 30 mg/dL (ref 5–40)

## 2022-07-10 ENCOUNTER — Other Ambulatory Visit: Payer: Self-pay | Admitting: Internal Medicine

## 2022-07-10 ENCOUNTER — Other Ambulatory Visit: Payer: Self-pay | Admitting: Cardiovascular Disease

## 2022-07-11 ENCOUNTER — Ambulatory Visit (INDEPENDENT_AMBULATORY_CARE_PROVIDER_SITE_OTHER): Payer: 59 | Admitting: Internal Medicine

## 2022-07-11 VITALS — BP 115/60 | HR 75 | Ht 64.0 in | Wt 194.6 lb

## 2022-07-11 DIAGNOSIS — I10 Essential (primary) hypertension: Secondary | ICD-10-CM | POA: Diagnosis not present

## 2022-07-11 DIAGNOSIS — E785 Hyperlipidemia, unspecified: Secondary | ICD-10-CM | POA: Diagnosis not present

## 2022-07-11 DIAGNOSIS — E875 Hyperkalemia: Secondary | ICD-10-CM

## 2022-07-11 DIAGNOSIS — N184 Chronic kidney disease, stage 4 (severe): Secondary | ICD-10-CM | POA: Insufficient documentation

## 2022-07-11 LAB — POCT URINALYSIS DIPSTICK
Bilirubin, UA: NEGATIVE
Glucose, UA: NEGATIVE
Ketones, UA: NEGATIVE
Leukocytes, UA: NEGATIVE
Nitrite, UA: NEGATIVE
Protein, UA: NEGATIVE
Spec Grav, UA: 1.015 (ref 1.010–1.025)
Urobilinogen, UA: 0.2 E.U./dL
pH, UA: 6 (ref 5.0–8.0)

## 2022-07-11 LAB — POC CREATINE & ALBUMIN,URINE
Creatinine, POC: 50 mg/dL
Microalbumin Ur, POC: 30 mg/L

## 2022-07-11 MED ORDER — SODIUM BICARBONATE 650 MG PO TABS: 650.0000 mg | ORAL_TABLET | Freq: Two times a day (BID) | ORAL | 1 refills | Status: DC

## 2022-07-11 MED ORDER — LOKELMA 10 G PO PACK: 10.0000 g | PACK | Freq: Three times a day (TID) | ORAL | 0 refills | Status: AC

## 2022-07-11 NOTE — Progress Notes (Signed)
Established Patient Office Visit  Subjective:  Patient ID: Gregory [name], male    DOB: [birth year]  Age: 77 y.o. MRN: [id number]  Chief Complaint  Patient presents with   Follow-up    6 WEEKS FOLLOW UP    No new complaints, here for lab review and medication refills. Labs reviewed and notable for deterioration in renal function and hyperkalemia with acidosis.  LDL and TC well controlled on lab review. Triglycerides high however.     No other concerns at this time.   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   Pneumothorax    Associated with pneumonia    Past Surgical History:  Procedure Laterality Date   Arm surgery     CARDIAC CATHETERIZATION N/A 12/05/2014   Procedure: Left Heart Cath and Coronary Angiography;  Surgeon: Lyn Records, MD;  Location: [facility] INVASIVE CV LAB;  Service: Cardiovascular;  Laterality: N/A;   CATARACT EXTRACTION W/PHACO Right 01/14/2013   Procedure: RIGHT EYE CATARACT EXTRACTION PHACO AND INTRAOCULAR LENS PLACEMENT ;  Surgeon: Gemma Payor, MD;  Location: [facility];  Service: Ophthalmology;  Laterality: Right;  CDE 54.97   CATARACT EXTRACTION W/PHACO Left 02/18/2013   Procedure: CATARACT EXTRACTION PHACO AND INTRAOCULAR LENS PLACEMENT (IOC);  Surgeon: Gemma Payor, MD;  Location: [facility];  Service: Ophthalmology;  Laterality: Left;  CDE 34.02   COLONOSCOPY WITH PROPOFOL N/A 07/13/2016   Procedure: COLONOSCOPY WITH PROPOFOL;  Surgeon: Scot Jun, MD;  Location: [facility] ENDOSCOPY;  Service: Endoscopy;  Laterality: N/A;   INGUINAL HERNIA REPAIR Right 09/09/2012   Procedure: HERNIA REPAIR INGUINAL INCARCERATED;  Surgeon: Kandis Cocking, MD;  Location: [facility];  Service: General;  Laterality: Right;   INSERTION OF MESH Right 09/09/2012   Procedure: INSERTION OF MESH;  Surgeon: Kandis Cocking, MD;  Location: [facility];  Service: General;  Laterality: Right;   LEFT HEART  CATH AND CORONARY ANGIOGRAPHY Right 09/21/2021   Procedure: LEFT HEART CATH AND CORONARY ANGIOGRAPHY with intervention;  Surgeon: Laurier Nancy, MD;  Location: [facility] INVASIVE CV LAB;  Service: Cardiovascular;  Laterality: Right;    Social History   Socioeconomic History   Marital status: Widowed    Spouse name: Not on file   Number of children: 2   Years of education: Not on file   Highest education level: Not on file  Occupational History   Not on file  Tobacco Use   Smoking status: Former    Types: Cigarettes    Quit date: 01/28/1978    Years since quitting: 44.4   Smokeless tobacco: Never  Vaping Use   Vaping Use: Never used  Substance and Sexual Activity   Alcohol use: No    Alcohol/week: 0.0 standard drinks of alcohol   Drug use: No   Sexual activity: Not on file  Other Topics Concern   Not on file  Social History Narrative   Wife passed approx. 4 years ago. Has 2 daughters. Lives by himself now.    Social Determinants of Health   Financial Resource Strain: Not on file  Food Insecurity: No Food Insecurity (03/09/2022)   Hunger Vital Sign    Worried About Running Out of Food in the Last Year: Never true    Ran Out of Food in the Last Year: Never true  Transportation Needs: No Transportation Needs (03/09/2022)   PRAPARE - Transportation  Lack of Transportation (Medical): No    Lack of Transportation (Non-Medical): No  Physical Activity: Not on file  Stress: Not on file  Social Connections: Not on file  Intimate Partner Violence: Not on file    Family History  Problem Relation Age of Onset   COPD Father    Stroke Brother     No Known Allergies  Review of Systems  Constitutional: Negative.   HENT: Negative.    Eyes: Negative.   Respiratory: Negative.    Cardiovascular: Negative.   Gastrointestinal: Negative.   Genitourinary: Negative.   Musculoskeletal:  Positive for back pain and joint pain.  Skin: Negative.   Neurological: Negative.    Endo/Heme/Allergies: Negative.        Objective:   BP 115/60   Pulse 75   Ht 5\' 4"  (1.626 m)   Wt 194 lb 9.6 oz (88.3 kg)   SpO2 97%   BMI 33.40 kg/m   Vitals:   07/11/22 1304  BP: 115/60  Pulse: 75  Height: 5\' 4"  (1.626 m)  Weight: 194 lb 9.6 oz (88.3 kg)  SpO2: 97%  BMI (Calculated): 33.39    Physical Exam Vitals reviewed.  Constitutional:      Appearance: Normal appearance.  HENT:     Head: Normocephalic.     Left Ear: There is no impacted cerumen.     Nose: Nose normal.     Mouth/Throat:     Mouth: Mucous membranes are moist.     Pharynx: No posterior oropharyngeal erythema.  Eyes:     Extraocular Movements: Extraocular movements intact.     Pupils: Pupils are equal, round, and reactive to light.  Cardiovascular:     Rate and Rhythm: Regular rhythm.     Chest Wall: PMI is not displaced.     Pulses: Normal pulses.     Heart sounds: Normal heart sounds. No murmur heard. Pulmonary:     Effort: Pulmonary effort is normal.     Breath sounds: Normal air entry. No rhonchi or rales.  Abdominal:     General: Abdomen is flat. Bowel sounds are normal. There is no distension.     Palpations: Abdomen is soft. There is no hepatomegaly, splenomegaly or mass.     Tenderness: There is no abdominal tenderness.  Musculoskeletal:        General: Normal range of motion.     Cervical back: Normal range of motion and neck supple.     Right lower leg: No edema.     Left lower leg: No edema.  Skin:    General: Skin is warm and dry.  Neurological:     General: No focal deficit present.     Mental Status: He is alert and oriented to person, place, and time.     Cranial Nerves: No cranial nerve deficit.     Motor: No weakness.  Psychiatric:        Mood and Affect: Mood normal.        Behavior: Behavior normal.      Results for orders placed or performed in visit on 07/11/22  POCT Urinalysis Dipstick (81002)  Result Value Ref Range   Color, UA     Clarity, UA      Glucose, UA Negative Negative   Bilirubin, UA Negative    Ketones, UA Negative    Spec Grav, UA 1.015 1.010 - 1.025   Blood, UA 1+    pH, UA 6.0 5.0 - 8.0   Protein, UA Negative Negative  Urobilinogen, UA 0.2 0.2 or 1.0 E.U./dL   Nitrite, UA Negative    Leukocytes, UA Negative Negative   Appearance     Odor    POC CREATINE & ALBUMIN,URINE  Result Value Ref Range   Microalbumin Ur, POC 30 mg/L   Creatinine, POC 50 mg/dL   Albumin/Creatinine Ratio, Urine, POC 30-300     Recent Results (from the past 2160 hour(Mert Dietrick))  CMP14+EGFR     Status: Abnormal   Collection Time: 07/08/22 10:23 AM  Result Value Ref Range   Glucose 90 70 - 99 mg/dL   BUN 45 (H) 8 - 27 mg/dL   Creatinine, Ser 4.09 (H) 0.76 - 1.27 mg/dL   eGFR 22 (L) >81 XB/JYN/8.29   BUN/Creatinine Ratio 16 10 - 24   Sodium 135 134 - 144 mmol/L   Potassium 6.1 (H) 3.5 - 5.2 mmol/L   Chloride 104 96 - 106 mmol/L   CO2 17 (L) 20 - 29 mmol/L   Calcium 10.1 8.6 - 10.2 mg/dL   Total Protein 7.3 6.0 - 8.5 g/dL   Albumin 4.4 3.8 - 4.8 g/dL   Globulin, Total 2.9 1.5 - 4.5 g/dL   Bilirubin Total 1.2 0.0 - 1.2 mg/dL   Alkaline Phosphatase 72 44 - 121 IU/L   AST 43 (H) 0 - 40 IU/L   ALT 39 0 - 44 IU/L  Lipid Panel w/o Chol/HDL Ratio     Status: Abnormal   Collection Time: 07/08/22 10:23 AM  Result Value Ref Range   Cholesterol, Total 102 100 - 199 mg/dL   Triglycerides 562 (H) 0 - 149 mg/dL   HDL 37 (L) >13 mg/dL   VLDL Cholesterol Cal 30 5 - 40 mg/dL   LDL Chol Calc (NIH) 35 0 - 99 mg/dL  Uric acid     Status: None   Collection Time: 07/08/22 10:23 AM  Result Value Ref Range   Uric Acid 6.4 3.8 - 8.4 mg/dL    Comment:            Therapeutic target for gout patients: <6.0  POC CREATINE & ALBUMIN,URINE     Status: Abnormal   Collection Time: 07/11/22  1:40 PM  Result Value Ref Range   Microalbumin Ur, POC 30 mg/L   Creatinine, POC 50 mg/dL   Albumin/Creatinine Ratio, Urine, POC 30-300   POCT Urinalysis Dipstick (08657)      Status: Abnormal   Collection Time: 07/11/22  1:41 PM  Result Value Ref Range   Color, UA     Clarity, UA     Glucose, UA Negative Negative   Bilirubin, UA Negative    Ketones, UA Negative    Spec Grav, UA 1.015 1.010 - 1.025   Blood, UA 1+    pH, UA 6.0 5.0 - 8.0   Protein, UA Negative Negative   Urobilinogen, UA 0.2 0.2 or 1.0 E.U./dL   Nitrite, UA Negative    Leukocytes, UA Negative Negative   Appearance     Odor        Assessment & Plan:  As per problem list. Dc Spironolactone until Nephrology eval. Stricter low calorie diet, low cholesterol and low fat diet and exercise as much as possible  Problem List Items Addressed This Visit       Cardiovascular and Mediastinum   Essential hypertension     Genitourinary   CKD (chronic kidney disease) stage 4, GFR 15-29 ml/min (HCC) - Primary   Relevant Medications   sodium bicarbonate 650 MG tablet  Other Relevant Orders   Ambulatory referral to Nephrology   POCT Urinalysis Dipstick (16109) (Completed)   US Renal   POC CREATINE & ALBUMIN,URINE (Completed)     Other   Dyslipidemia   Other Visit Diagnoses     Hyperkalemia       Relevant Medications   sodium zirconium cyclosilicate (LOKELMA) 10 g PACK packet   Other Relevant Orders   Potassium       Return in about 2 weeks (around 07/25/2022).   Total time spent: 30 minutes  Luna Fuse, MD  07/11/2022   This document may have been prepared by [facility] Voice Recognition software and as such may include unintentional dictation errors.

## 2022-07-12 ENCOUNTER — Other Ambulatory Visit: Payer: 59

## 2022-07-12 DIAGNOSIS — E785 Hyperlipidemia, unspecified: Secondary | ICD-10-CM | POA: Diagnosis not present

## 2022-07-13 LAB — COMPREHENSIVE METABOLIC PANEL
ALT: 31 IU/L (ref 0–44)
AST: 34 IU/L (ref 0–40)
Albumin: 4.3 g/dL (ref 3.8–4.8)
Alkaline Phosphatase: 63 IU/L (ref 44–121)
BUN/Creatinine Ratio: 11 (ref 10–24)
BUN: 28 mg/dL — ABNORMAL HIGH (ref 8–27)
Bilirubin Total: 1.6 mg/dL — ABNORMAL HIGH (ref 0.0–1.2)
CO2: 16 mmol/L — ABNORMAL LOW (ref 20–29)
Calcium: 9.5 mg/dL (ref 8.6–10.2)
Chloride: 99 mmol/L (ref 96–106)
Creatinine, Ser: 2.46 mg/dL — ABNORMAL HIGH (ref 0.76–1.27)
Globulin, Total: 2.5 g/dL (ref 1.5–4.5)
Glucose: 90 mg/dL (ref 70–99)
Potassium: 4.8 mmol/L (ref 3.5–5.2)
Sodium: 131 mmol/L — ABNORMAL LOW (ref 134–144)
Total Protein: 6.8 g/dL (ref 6.0–8.5)
eGFR: 27 mL/min/{1.73_m2} — ABNORMAL LOW (ref >59)

## 2022-07-18 NOTE — Progress Notes (Signed)
Spoke with patient informing about lab result and to keep appointment with Nephrology, patient verbalized understanding.

## 2022-07-20 ENCOUNTER — Ambulatory Visit (INDEPENDENT_AMBULATORY_CARE_PROVIDER_SITE_OTHER): Payer: 59

## 2022-07-20 DIAGNOSIS — N184 Chronic kidney disease, stage 4 (severe): Secondary | ICD-10-CM

## 2022-07-21 ENCOUNTER — Other Ambulatory Visit: Payer: Self-pay | Admitting: Internal Medicine

## 2022-07-21 DIAGNOSIS — E875 Hyperkalemia: Secondary | ICD-10-CM | POA: Diagnosis not present

## 2022-07-21 DIAGNOSIS — N184 Chronic kidney disease, stage 4 (severe): Secondary | ICD-10-CM | POA: Diagnosis not present

## 2022-07-21 DIAGNOSIS — I129 Hypertensive chronic kidney disease with stage 1 through stage 4 chronic kidney disease, or unspecified chronic kidney disease: Secondary | ICD-10-CM | POA: Diagnosis not present

## 2022-07-22 DIAGNOSIS — R809 Proteinuria, unspecified: Secondary | ICD-10-CM | POA: Diagnosis not present

## 2022-07-22 DIAGNOSIS — I129 Hypertensive chronic kidney disease with stage 1 through stage 4 chronic kidney disease, or unspecified chronic kidney disease: Secondary | ICD-10-CM | POA: Diagnosis not present

## 2022-07-22 DIAGNOSIS — E875 Hyperkalemia: Secondary | ICD-10-CM | POA: Diagnosis not present

## 2022-07-22 DIAGNOSIS — E8722 Chronic metabolic acidosis: Secondary | ICD-10-CM | POA: Diagnosis not present

## 2022-07-22 DIAGNOSIS — N184 Chronic kidney disease, stage 4 (severe): Secondary | ICD-10-CM | POA: Diagnosis not present

## 2022-07-26 ENCOUNTER — Other Ambulatory Visit: Payer: Self-pay | Admitting: Internal Medicine

## 2022-07-26 ENCOUNTER — Ambulatory Visit (INDEPENDENT_AMBULATORY_CARE_PROVIDER_SITE_OTHER): Payer: 59 | Admitting: Internal Medicine

## 2022-07-26 VITALS — BP 100/80 | HR 68 | Ht 64.0 in | Wt 191.6 lb

## 2022-07-26 DIAGNOSIS — N184 Chronic kidney disease, stage 4 (severe): Secondary | ICD-10-CM | POA: Diagnosis not present

## 2022-07-26 DIAGNOSIS — F5101 Primary insomnia: Secondary | ICD-10-CM

## 2022-07-26 DIAGNOSIS — I10 Essential (primary) hypertension: Secondary | ICD-10-CM | POA: Diagnosis not present

## 2022-07-26 MED ORDER — TRAZODONE HCL 50 MG PO TABS
50.0000 mg | ORAL_TABLET | Freq: Every evening | ORAL | 0 refills | Status: DC | PRN
Start: 2022-07-26 — End: 2022-09-14

## 2022-07-26 NOTE — Progress Notes (Signed)
Established Patient Office Visit  Subjective:  Patient ID: Gregory [name], male    DOB: 1945/11/23  Age: 77 y.o. MRN: [id number]  Chief Complaint  Patient presents with   Follow-up    2 week F/U    No new complaints, here for lab review and medication refills. Still c/o mild postural dizziness but has significantly increased his fluid consumption. Renal USS reviewed with pt.    No other concerns at this time.   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   Pneumothorax    Associated with pneumonia    Past Surgical History:  Procedure Laterality Date   Arm surgery     CARDIAC CATHETERIZATION N/A 12/05/2014   Procedure: Left Heart Cath and Coronary Angiography;  Surgeon: Lyn Records, MD;  Location: [facility] INVASIVE CV LAB;  Service: Cardiovascular;  Laterality: N/A;   CATARACT EXTRACTION W/PHACO Right 01/14/2013   Procedure: RIGHT EYE CATARACT EXTRACTION PHACO AND INTRAOCULAR LENS PLACEMENT ;  Surgeon: Gemma Payor, MD;  Location: [facility]S;  Service: Ophthalmology;  Laterality: Right;  CDE 54.97   CATARACT EXTRACTION W/PHACO Left 02/18/2013   Procedure: CATARACT EXTRACTION PHACO AND INTRAOCULAR LENS PLACEMENT (IOC);  Surgeon: Gemma Payor, MD;  Location: [facility];  Service: Ophthalmology;  Laterality: Left;  CDE 34.02   COLONOSCOPY WITH PROPOFOL N/A 07/13/2016   Procedure: COLONOSCOPY WITH PROPOFOL;  Surgeon: Scot Jun, MD;  Location: [facility] ENDOSCOPY;  Service: Endoscopy;  Laterality: N/A;   INGUINAL HERNIA REPAIR Right 09/09/2012   Procedure: HERNIA REPAIR INGUINAL INCARCERATED;  Surgeon: Kandis Cocking, MD;  Location: [facility] ORS;  Service: General;  Laterality: Right;   INSERTION OF MESH Right 09/09/2012   Procedure: INSERTION OF MESH;  Surgeon: Kandis Cocking, MD;  Location: [facility] OR[facility];  Service: General;  Laterality: Right;   LEFT HEART CATH AND CORONARY ANGIOGRAPHY Right 09/21/2021   Procedure:  LEFT HEART CATH AND CORONARY ANGIOGRAPHY with intervention;  Surgeon: Laurier Nancy, MD;  Location: [facility] INVASIVE CV LAB;  Service: Cardiovascular;  Laterality: Right;    Social History   Socioeconomic History   Marital status: Widowed    Spouse name: Not on file   Number of children: 2   Years of education: Not on file   Highest education level: Not on file  Occupational History   Not on file  Tobacco Use   Smoking status: Former    Types: Cigarettes    Quit date: 01/28/1978    Years since quitting: 44.5   Smokeless tobacco: Never  Vaping Use   Vaping Use: Never used  Substance and Sexual Activity   Alcohol use: No    Alcohol/week: 0.0 standard drinks of alcohol   Drug use: No   Sexual activity: Not on file  Other Topics Concern   Not on file  Social History Narrative   Wife passed approx. 4 years ago. Has 2 daughters. Lives by himself now.    Social Determinants of Health   Financial Resource Strain: Not on file  Food Insecurity: No Food Insecurity (03/09/2022)   Hunger Vital Sign    Worried About Running Out of Food in the Last Year: Never true    Ran Out of Food in the Last Year: Never true  Transportation Needs: No Transportation Needs (03/09/2022)   PRAPARE - Transportation    Lack of Transportation (Medical): No    Lack  of Transportation (Non-Medical): No  Physical Activity: Not on file  Stress: Not on file  Social Connections: Not on file  Intimate Partner Violence: Not on file    Family History  Problem Relation Age of Onset   COPD Father    Stroke Brother     No Known Allergies  Review of Systems  Constitutional: Negative.   HENT: Negative.    Eyes: Negative.   Respiratory: Negative.    Cardiovascular: Negative.   Gastrointestinal: Negative.   Genitourinary:  Positive for frequency. Negative for urgency.  Musculoskeletal:  Positive for back pain and joint pain.  Skin: Negative.   Neurological: Negative.   Endo/Heme/Allergies: Negative.         Objective:   BP 100/80   Pulse 68   Ht 5\' 4"  (1.626 m)   Wt 191 lb 9.6 oz (86.9 kg)   SpO2 97%   BMI 32.89 kg/m   Vitals:   07/26/22 1254  BP: 100/80  Pulse: 68  Height: 5\' 4"  (1.626 m)  Weight: 191 lb 9.6 oz (86.9 kg)  SpO2: 97%  BMI (Calculated): 32.87    Physical Exam Vitals reviewed.  Constitutional:      Appearance: Normal appearance.  HENT:     Head: Normocephalic.     Left Ear: There is no impacted cerumen.     Nose: Nose normal.     Mouth/Throat:     Mouth: Mucous membranes are moist.     Pharynx: No posterior oropharyngeal erythema.  Eyes:     Extraocular Movements: Extraocular movements intact.     Pupils: Pupils are equal, round, and reactive to light.  Cardiovascular:     Rate and Rhythm: Regular rhythm.     Chest Wall: PMI is not displaced.     Pulses: Normal pulses.     Heart sounds: Normal heart sounds. No murmur heard. Pulmonary:     Effort: Pulmonary effort is normal.     Breath sounds: Normal air entry. No rhonchi or rales.  Abdominal:     General: Abdomen is flat. Bowel sounds are normal. There is no distension.     Palpations: Abdomen is soft. There is no hepatomegaly, splenomegaly or mass.     Tenderness: There is no abdominal tenderness.  Musculoskeletal:        General: Normal range of motion.     Cervical back: Normal range of motion and neck supple.     Right lower leg: No edema.     Left lower leg: No edema.  Skin:    General: Skin is warm and dry.  Neurological:     General: No focal deficit present.     Mental Status: He is alert and oriented to person, place, and time.     Cranial Nerves: No cranial nerve deficit.     Motor: No weakness.  Psychiatric:        Mood and Affect: Mood normal.        Behavior: Behavior normal.      No results found for any visits on 07/26/22.  Recent Results (from the past 2160 hour(Rekita Miotke))  CMP14+EGFR     Status: Abnormal   Collection Time: 07/08/22 10:23 AM  Result Value Ref Range    Glucose 90 70 - 99 mg/dL   BUN 45 (H) 8 - 27 mg/dL   Creatinine, Ser 1.61 (H) 0.76 - 1.27 mg/dL   eGFR 22 (L) >09 UE/AVW/0.98   BUN/Creatinine Ratio 16 10 - 24   Sodium 135 134 -  144 mmol/L   Potassium 6.1 (H) 3.5 - 5.2 mmol/L   Chloride 104 96 - 106 mmol/L   CO2 17 (L) 20 - 29 mmol/L   Calcium 10.1 8.6 - 10.2 mg/dL   Total Protein 7.3 6.0 - 8.5 g/dL   Albumin 4.4 3.8 - 4.8 g/dL   Globulin, Total 2.9 1.5 - 4.5 g/dL   Bilirubin Total 1.2 0.0 - 1.2 mg/dL   Alkaline Phosphatase 72 44 - 121 IU/L   AST 43 (H) 0 - 40 IU/L   ALT 39 0 - 44 IU/L  Lipid Panel w/o Chol/HDL Ratio     Status: Abnormal   Collection Time: 07/08/22 10:23 AM  Result Value Ref Range   Cholesterol, Total 102 100 - 199 mg/dL   Triglycerides 161 (H) 0 - 149 mg/dL   HDL 37 (L) >09 mg/dL   VLDL Cholesterol Cal 30 5 - 40 mg/dL   LDL Chol Calc (NIH) 35 0 - 99 mg/dL  Uric acid     Status: None   Collection Time: 07/08/22 10:23 AM  Result Value Ref Range   Uric Acid 6.4 3.8 - 8.4 mg/dL    Comment:            Therapeutic target for gout patients: <6.0  POC CREATINE & ALBUMIN,URINE     Status: Abnormal   Collection Time: 07/11/22  1:40 PM  Result Value Ref Range   Microalbumin Ur, POC 30 mg/L   Creatinine, POC 50 mg/dL   Albumin/Creatinine Ratio, Urine, POC 30-300   POCT Urinalysis Dipstick (60454)     Status: Abnormal   Collection Time: 07/11/22  1:41 PM  Result Value Ref Range   Color, UA     Clarity, UA     Glucose, UA Negative Negative   Bilirubin, UA Negative    Ketones, UA Negative    Spec Grav, UA 1.015 1.010 - 1.025   Blood, UA 1+    pH, UA 6.0 5.0 - 8.0   Protein, UA Negative Negative   Urobilinogen, UA 0.2 0.2 or 1.0 E.U./dL   Nitrite, UA Negative    Leukocytes, UA Negative Negative   Appearance     Odor    Comprehensive metabolic panel     Status: Abnormal   Collection Time: 07/12/22  9:37 AM  Result Value Ref Range   Glucose 90 70 - 99 mg/dL   BUN 28 (H) 8 - 27 mg/dL   Creatinine, Ser 0.98  (H) 0.76 - 1.27 mg/dL   eGFR 27 (L) >11 BJ/YNW/2.95   BUN/Creatinine Ratio 11 10 - 24   Sodium 131 (L) 134 - 144 mmol/L   Potassium 4.8 3.5 - 5.2 mmol/L   Chloride 99 96 - 106 mmol/L   CO2 16 (L) 20 - 29 mmol/L   Calcium 9.5 8.6 - 10.2 mg/dL   Total Protein 6.8 6.0 - 8.5 g/dL   Albumin 4.3 3.8 - 4.8 g/dL   Globulin, Total 2.5 1.5 - 4.5 g/dL   Bilirubin Total 1.6 (H) 0.0 - 1.2 mg/dL   Alkaline Phosphatase 63 44 - 121 IU/L   AST 34 0 - 40 IU/L   ALT 31 0 - 44 IU/L      Assessment & Plan:  As per problem list.  Problem List Items Addressed This Visit       Cardiovascular and Mediastinum   Essential hypertension     Genitourinary   CKD (chronic kidney disease) stage 4, GFR 15-29 ml/min (HCC) - Primary  No follow-ups on file.   Total time spent: 20 minutes  Luna Fuse, MD  07/26/2022   This document may have been prepared by [facility] Voice Recognition software and as such may include unintentional dictation errors.

## 2022-07-27 LAB — BMP8+ANION GAP
Anion Gap: 16 mmol/L (ref 10.0–18.0)
BUN/Creatinine Ratio: 11 (ref 10–24)
BUN: 23 mg/dL (ref 8–27)
CO2: 19 mmol/L — ABNORMAL LOW (ref 20–29)
Calcium: 9.9 mg/dL (ref 8.6–10.2)
Chloride: 102 mmol/L (ref 96–106)
Creatinine, Ser: 2.03 mg/dL — ABNORMAL HIGH (ref 0.76–1.27)
Glucose: 100 mg/dL — ABNORMAL HIGH (ref 70–99)
Potassium: 4.6 mmol/L (ref 3.5–5.2)
Sodium: 137 mmol/L (ref 134–144)
eGFR: 34 mL/min/{1.73_m2} — ABNORMAL LOW (ref >59)

## 2022-07-27 NOTE — Progress Notes (Signed)
Patient notified

## 2022-08-04 ENCOUNTER — Other Ambulatory Visit: Payer: Self-pay | Admitting: Nurse Practitioner

## 2022-08-05 DIAGNOSIS — Z743 Need for continuous supervision: Secondary | ICD-10-CM | POA: Diagnosis not present

## 2022-08-05 DIAGNOSIS — R079 Chest pain, unspecified: Secondary | ICD-10-CM | POA: Diagnosis not present

## 2022-08-05 DIAGNOSIS — N390 Urinary tract infection, site not specified: Secondary | ICD-10-CM | POA: Diagnosis not present

## 2022-08-06 ENCOUNTER — Other Ambulatory Visit: Payer: Self-pay

## 2022-08-06 ENCOUNTER — Emergency Department (HOSPITAL_COMMUNITY)
Admission: EM | Admit: 2022-08-06 | Discharge: 2022-08-06 | Disposition: A | Discharge status: Home / Self Care | Payer: 59 | Attending: Emergency Medicine | Admitting: Emergency Medicine

## 2022-08-06 ENCOUNTER — Encounter (HOSPITAL_COMMUNITY): Payer: Self-pay | Admitting: Emergency Medicine

## 2022-08-06 ENCOUNTER — Emergency Department (HOSPITAL_COMMUNITY): Payer: 59

## 2022-08-06 DIAGNOSIS — R9389 Abnormal findings on diagnostic imaging of other specified body structures: Secondary | ICD-10-CM | POA: Diagnosis not present

## 2022-08-06 DIAGNOSIS — R0789 Other chest pain: Secondary | ICD-10-CM | POA: Insufficient documentation

## 2022-08-06 DIAGNOSIS — N189 Chronic kidney disease, unspecified: Secondary | ICD-10-CM | POA: Insufficient documentation

## 2022-08-06 DIAGNOSIS — I129 Hypertensive chronic kidney disease with stage 1 through stage 4 chronic kidney disease, or unspecified chronic kidney disease: Secondary | ICD-10-CM | POA: Insufficient documentation

## 2022-08-06 DIAGNOSIS — Z7982 Long term (current) use of aspirin: Secondary | ICD-10-CM | POA: Insufficient documentation

## 2022-08-06 DIAGNOSIS — Z79899 Other long term (current) drug therapy: Secondary | ICD-10-CM | POA: Insufficient documentation

## 2022-08-06 DIAGNOSIS — I2510 Atherosclerotic heart disease of native coronary artery without angina pectoris: Secondary | ICD-10-CM | POA: Diagnosis not present

## 2022-08-06 DIAGNOSIS — R109 Unspecified abdominal pain: Secondary | ICD-10-CM | POA: Diagnosis not present

## 2022-08-06 DIAGNOSIS — R079 Chest pain, unspecified: Secondary | ICD-10-CM

## 2022-08-06 LAB — CBC
HCT: 40 % (ref 39.0–52.0)
Hemoglobin: 13.7 g/dL (ref 13.0–17.0)
MCH: 31.1 pg (ref 26.0–34.0)
MCHC: 34.3 g/dL (ref 30.0–36.0)
MCV: 90.7 fL (ref 80.0–100.0)
Platelets: 240 10*3/uL (ref 150–400)
RBC: 4.41 MIL/uL (ref 4.22–5.81)
RDW: 14.7 % (ref 11.5–15.5)
WBC: 14.1 10*3/uL — ABNORMAL HIGH (ref 4.0–10.5)
nRBC: 0 % (ref 0.0–0.2)

## 2022-08-06 LAB — BASIC METABOLIC PANEL
Anion gap: 10 (ref 5–15)
BUN: 23 mg/dL (ref 8–23)
CO2: 20 mmol/L — ABNORMAL LOW (ref 22–32)
Calcium: 9.1 mg/dL (ref 8.9–10.3)
Chloride: 102 mmol/L (ref 98–111)
Creatinine, Ser: 1.66 mg/dL — ABNORMAL HIGH (ref 0.61–1.24)
GFR, Estimated: 43 mL/min — ABNORMAL LOW (ref >60)
Glucose, Bld: 114 mg/dL — ABNORMAL HIGH (ref 70–99)
Potassium: 3.9 mmol/L (ref 3.5–5.1)
Sodium: 132 mmol/L — ABNORMAL LOW (ref 135–145)

## 2022-08-06 LAB — TROPONIN I (HIGH SENSITIVITY)
Troponin I (High Sensitivity): 24 ng/L — ABNORMAL HIGH (ref <18)
Troponin I (High Sensitivity): 26 ng/L — ABNORMAL HIGH (ref <18)

## 2022-08-06 MED ORDER — MORPHINE SULFATE (PF) 4 MG/ML IV SOLN
4.0000 mg | Freq: Once | INTRAVENOUS | Status: AC
  Administered 2022-08-06: 4 mg via INTRAVENOUS
  Filled 2022-08-06: qty 1

## 2022-08-06 MED ORDER — SODIUM CHLORIDE 0.9 % IV BOLUS
1000.0000 mL | Freq: Once | INTRAVENOUS | Status: AC
  Administered 2022-08-06: 1000 mL via INTRAVENOUS

## 2022-08-06 MED ORDER — HYDROCODONE-ACETAMINOPHEN 5-325 MG PO TABS: 1.0000 | ORAL_TABLET | Freq: Four times a day (QID) | ORAL | 0 refills | Status: DC | PRN

## 2022-08-06 NOTE — ED Provider Notes (Signed)
[facility] EMERGENCY DEPARTMENT AT [facility] Provider Note   CSN: [id number] Arrival date & time: 08/06/22  0005     History  Chief Complaint  Patient presents with   Chest Pain    Gregory [name] is a 77 y.o. male.  Patient is a 77 year old male with history of coronary artery disease, hypertension, chronic renal insufficiency.  Patient presenting today with complaints of chest pain.  He describes a sharp pain to the center of his chest that started approximately 2 hours prior to presentation while watching wrestling on TV.  He denies any shortness of breath, nausea, or diaphoresis.  No fevers, chills, or cough.  He denies any recent exertional symptoms.  Patient had a heart cath in August 2023 showing minimal coronary artery disease.  The history is provided by the patient.       Home Medications Prior to Admission medications   Medication Sig Start Date End Date Taking? Authorizing Provider  acetaminophen (TYLENOL) 500 MG tablet Take 1,500 mg by mouth every 6 (six) hours as needed for moderate pain.    [provider]  allopurinol (ZYLOPRIM) 100 MG tablet Take 1 tablet by mouth once daily 05/02/22   Orson Eva, NP  aspirin 81 MG chewable tablet Chew 1 tablet (81 mg total) by mouth daily. 12/06/14   Kilroy, Luke K, PA-C  BELSOMRA 10 MG TABS Take 1 tablet by mouth at bedtime. 01/31/22   [provider]  BREZTRI AEROSPHERE 160-9-4.8 MCG/ACT AERO Inhale 2 puffs into the lungs 2 (two) times daily. 08/17/20   [provider]  celecoxib (CELEBREX) 200 MG capsule Take 200-400 mg by mouth daily as needed. 06/29/20   [provider]  cetirizine (ZYRTEC) 10 MG tablet TAKE 1 TABLET BY MOUTH ONCE DAILY IN THE MORNING 07/26/22   Sherron Monday, MD  clopidogrel (PLAVIX) 75 MG tablet Take 1 tablet by mouth once daily 07/11/22   Laurier Nancy, MD  Colchicine 0.6 MG CAPS USE AS DIRECTED. TAKE 2 CAPSULES FOR THE FIRST DOSE, THEN 1 CAPSULE AN HOUR  LATER, THEN 1 CAPSULE DAILY TILL PAIN FREE 07/11/22   Sherron Monday, MD  COMBIVENT RESPIMAT 20-100 MCG/ACT AERS respimat Inhale 1 puff into the lungs every 6 (six) hours as needed for shortness of breath. 01/28/21   [provider]  fluticasone (FLONASE) 50 MCG/ACT nasal spray Place 1 spray into both nostrils 2 (two) times daily. 11/18/14   [provider]  furosemide (LASIX) 20 MG tablet Take 1 tablet by mouth once daily 05/23/22   Laurier Nancy, MD  isosorbide mononitrate (IMDUR) 30 MG 24 hr tablet Take 1 tablet by mouth once daily 05/02/22   Laurier Nancy, MD  lisinopril (ZESTRIL) 40 MG tablet Take 1 tablet by mouth once daily 05/23/22   Sherron Monday, MD  metoprolol tartrate (LOPRESSOR) 25 MG tablet TAKE 1/2 (ONE-HALF) TABLET BY MOUTH TWICE DAILY AS DIRECTED 05/02/22   Orson Eva, NP  rosuvastatin (CRESTOR) 40 MG tablet Take 1 tablet by mouth in the evening 08/05/22   Sherron Monday, MD  sodium bicarbonate 650 MG tablet Take 1 tablet (650 mg total) by mouth 2 (two) times daily. 07/11/22 07/11/23  Sherron Monday, MD  spironolactone (ALDACTONE) 25 MG tablet Take 1 tablet (25 mg total) by mouth daily. 05/24/22 05/24/23  Laurier Nancy, MD  traZODone (DESYREL) 50 MG tablet Take 1 tablet (50 mg total) by mouth at bedtime as needed. for sleep 07/26/22  Sherron Monday, MD      Allergies    Patient has no known allergies.    Review of Systems   Review of Systems  All other systems reviewed and are negative.   Physical Exam Updated Vital Signs BP 118/65 (BP Location: Right Arm)   Pulse 71   Temp 98.3 F (36.8 C) (Oral)   Resp 15   Ht 5\' 4"  (1.626 m)   Wt 87 kg   SpO2 96%   BMI 32.92 kg/m  Physical Exam Vitals and nursing note reviewed.  Constitutional:      General: He is not in acute distress.    Appearance: He is well-developed. He is not diaphoretic.  HENT:     Head: Normocephalic and atraumatic.  Cardiovascular:     Rate and Rhythm: Normal  rate and regular rhythm.     Heart sounds: No murmur heard.    No friction rub.  Pulmonary:     Effort: Pulmonary effort is normal. No respiratory distress.     Breath sounds: Normal breath sounds. No wheezing or rales.  Abdominal:     General: Bowel sounds are normal. There is no distension.     Palpations: Abdomen is soft.     Tenderness: There is no abdominal tenderness.  Musculoskeletal:        General: Normal range of motion.     Cervical back: Normal range of motion and neck supple.     Right lower leg: No tenderness. No edema.     Left lower leg: No tenderness. No edema.  Skin:    General: Skin is warm and dry.  Neurological:     Mental Status: He is alert and oriented to person, place, and time.     Coordination: Coordination normal.     ED Results / Procedures / Treatments   Labs (all labs ordered are listed, but only abnormal results are displayed) Labs Reviewed  BASIC METABOLIC PANEL - Abnormal; Notable for the following components:      Result Value   Sodium 132 (*)    CO2 20 (*)    Glucose, Bld 114 (*)    Creatinine, Ser 1.66 (*)    GFR, Estimated 43 (*)    All other components within normal limits  CBC - Abnormal; Notable for the following components:   WBC 14.1 (*)    All other components within normal limits  TROPONIN I (HIGH SENSITIVITY) - Abnormal; Notable for the following components:   Troponin I (High Sensitivity) 26 (*)    All other components within normal limits    EKG EKG Interpretation Date/Time:  Saturday August 06 2022 00:11:45 EDT Ventricular Rate:  74 PR Interval:  187 QRS Duration:  102 QT Interval:  408 QTC Calculation: 453 R Axis:   -31  Text Interpretation: Sinus rhythm Left axis deviation Low voltage, precordial leads Consider anterior infarct Confirmed by Geoffery Lyons ([id number]) on 08/06/2022 12:22:12 AM  Radiology DG Chest Portable 1 View  Result Date: 08/06/2022 CLINICAL DATA:  Chest and abdomen pain. EXAM: PORTABLE CHEST 1  VIEW COMPARISON:  Portable chest 08/31/2020 FINDINGS: The heart size and mediastinal contours are within normal limits. Both lungs are clear with mild chronic elevation of the right hemidiaphragm. The visualized skeletal structures are unremarkable aside from osteopenia. Multiple overlying monitor wires. IMPRESSION: No active disease.  Stable chest. Electronically Signed   By: Almira Bar M.D.   On: 08/06/2022 00:43    Procedures Procedures    Medications Ordered  in ED Medications  morphine (PF) 4 MG/ML injection 4 mg (has no administration in time range)    ED Course/ Medical Decision Making/ A&P  Patient is a 77 year old male presenting with complaints of chest discomfort as described in the HPI.  This began this evening while he was watching TV.  Patient arrives here with stable vital signs and is afebrile.  Workup initiated including CBC, metabolic panel, and troponin x 2.  All studies basically unremarkable.  He has baseline renal insufficiency and initial troponin was 26, then repeated and was 24.  I suspect renal insufficiency is the cause of his elevated troponin.  It has been mildly elevated to a similar degree in the past.  Chest x-ray obtained showing no active disease.  Patient given morphine for pain and seems to be feeling better.  At this point, patient's symptoms are somewhat atypical, his troponins are consistent with baseline and are not trending upward.  I have also reviewed his record and it appears as though he had a heart cath in September 2023 showing no significant stenoses.  I doubt a cardiac etiology and feel as though patient can be discharged.  I have considered other causes such as pneumothorax, however this is not seen on the chest x-ray.  I also doubt aortic dissection or pulmonary embolism.  Final Clinical Impression(s) / ED Diagnoses Final diagnoses:  None    Rx / DC Orders ED Discharge Orders     None         Geoffery Lyons, MD 08/06/22  0301

## 2022-08-06 NOTE — ED Triage Notes (Signed)
Pt brought in by EMS for c/o chest pain (epigastric area) x 1.5 hrs. EMS states pt reports belching. ASA 324mg  given en route.

## 2022-08-06 NOTE — Discharge Instructions (Addendum)
Take hydrocodone as prescribed as needed for pain.  Follow-up with your primary doctor in the next few days if symptoms are not improving and return to the ER if your symptoms significantly worsen or change.

## 2022-08-07 ENCOUNTER — Other Ambulatory Visit: Payer: Self-pay | Admitting: Cardiovascular Disease

## 2022-08-07 ENCOUNTER — Other Ambulatory Visit: Payer: Self-pay | Admitting: Internal Medicine

## 2022-08-07 ENCOUNTER — Other Ambulatory Visit: Payer: Self-pay | Admitting: Nurse Practitioner

## 2022-08-08 ENCOUNTER — Telehealth: Payer: 59

## 2022-08-11 ENCOUNTER — Telehealth: Payer: Self-pay

## 2022-08-11 DIAGNOSIS — N184 Chronic kidney disease, stage 4 (severe): Secondary | ICD-10-CM | POA: Diagnosis not present

## 2022-08-11 DIAGNOSIS — I10 Essential (primary) hypertension: Secondary | ICD-10-CM | POA: Diagnosis not present

## 2022-08-11 NOTE — Telephone Encounter (Signed)
Transition Care Management Unsuccessful Follow-up Telephone Call  Date of discharge and from where:  Gregory [name] 7/13  Attempts:  1st Attempt  Reason for unsuccessful TCM follow-up call:  No answer/busy   Gregory [name] [facility] Guide, [facility] [phone] [address] [place], [place], [place] [address] Phone: [phone] Email: [email or web address]

## 2022-08-12 ENCOUNTER — Other Ambulatory Visit: Payer: Self-pay | Admitting: Cardiovascular Disease

## 2022-08-12 ENCOUNTER — Telehealth: Payer: Self-pay

## 2022-08-12 DIAGNOSIS — R0602 Shortness of breath: Secondary | ICD-10-CM

## 2022-08-13 ENCOUNTER — Other Ambulatory Visit: Payer: Self-pay | Admitting: Internal Medicine

## 2022-08-18 ENCOUNTER — Other Ambulatory Visit: Payer: Self-pay | Admitting: Cardiovascular Disease

## 2022-08-20 ENCOUNTER — Other Ambulatory Visit: Payer: Self-pay | Admitting: Internal Medicine

## 2022-08-20 ENCOUNTER — Other Ambulatory Visit: Payer: Self-pay | Admitting: Cardiovascular Disease

## 2022-09-14 ENCOUNTER — Other Ambulatory Visit: Payer: Self-pay | Admitting: Internal Medicine

## 2022-09-14 DIAGNOSIS — F5101 Primary insomnia: Secondary | ICD-10-CM

## 2022-09-23 ENCOUNTER — Ambulatory Visit: Payer: 59 | Admitting: Cardiovascular Disease

## 2022-09-23 ENCOUNTER — Encounter: Payer: Self-pay | Admitting: Cardiovascular Disease

## 2022-09-23 VITALS — BP 103/61 | HR 67 | Ht 64.0 in | Wt 190.2 lb

## 2022-09-23 DIAGNOSIS — R0602 Shortness of breath: Secondary | ICD-10-CM

## 2022-09-23 DIAGNOSIS — I5033 Acute on chronic diastolic (congestive) heart failure: Secondary | ICD-10-CM

## 2022-09-23 DIAGNOSIS — I2510 Atherosclerotic heart disease of native coronary artery without angina pectoris: Secondary | ICD-10-CM

## 2022-09-23 DIAGNOSIS — I10 Essential (primary) hypertension: Secondary | ICD-10-CM

## 2022-09-23 DIAGNOSIS — I340 Nonrheumatic mitral (valve) insufficiency: Secondary | ICD-10-CM | POA: Diagnosis not present

## 2022-09-23 MED ORDER — EMPAGLIFLOZIN 25 MG PO TABS
25.0000 mg | ORAL_TABLET | Freq: Every day | ORAL | 4 refills | Status: AC
Start: 2022-09-23 — End: ?

## 2022-09-23 NOTE — Progress Notes (Signed)
Cardiology Office Note   Date:  09/23/2022   ID:  Bayden, Jeannot April 23, 1945, MRN [id number]  PCP:  Sherron Monday, MD  Cardiologist:  Adrian Blackwater, MD      History of Present Illness: Gregory [name] is a 77 y.o. 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   Pneumothorax    Associated with pneumonia     Past Surgical History:  Procedure Laterality Date   Arm surgery     CARDIAC CATHETERIZATION N/A 12/05/2014   Procedure: Left Heart Cath and Coronary Angiography;  Surgeon: Lyn Records, MD;  Location: [facility] INVASIVE CV LAB;  Service: Cardiovascular;  Laterality: N/A;   CATARACT EXTRACTION W/PHACO Right 01/14/2013   Procedure: RIGHT EYE CATARACT EXTRACTION PHACO AND INTRAOCULAR LENS PLACEMENT ;  Surgeon: Gemma Payor, MD;  Location: [facility] ORS;  Service: Ophthalmology;  Laterality: Right;  CDE 54.97   CATARACT EXTRACTION W/PHACO Left 02/18/2013   Procedure: CATARACT EXTRACTION PHACO AND INTRAOCULAR LENS PLACEMENT (IOC);  Surgeon: Gemma Payor, MD;  Location: [facility];  Service: Ophthalmology;  Laterality: Left;  CDE 34.02   COLONOSCOPY WITH PROPOFOL N/A 07/13/2016   Procedure: COLONOSCOPY WITH PROPOFOL;  Surgeon: Scot Jun, MD;  Location: [facility] ENDOSCOPY;  Service: Endoscopy;  Laterality: N/A;   INGUINAL HERNIA REPAIR Right 09/09/2012   Procedure: HERNIA REPAIR INGUINAL INCARCERATED;  Surgeon: Kandis Cocking, MD;  Location: [facility] ORS;  Service: General;  Laterality: Right;   INSERTION OF MESH Right 09/09/2012   Procedure: INSERTION OF MESH;  Surgeon: Kandis Cocking, MD;  Location: [facility] ORS;  Service: General;  Laterality: Right;   LEFT HEART CATH AND CORONARY ANGIOGRAPHY Right 09/21/2021   Procedure: LEFT HEART CATH AND CORONARY ANGIOGRAPHY with intervention;  Surgeon: Laurier Nancy, MD;   Location: [facility] INVASIVE CV LAB;  Service: Cardiovascular;  Laterality: Right;     Current Outpatient Medications  Medication Sig Dispense Refill   empagliflozin (JARDIANCE) 25 MG TABS tablet Take 1 tablet (25 mg total) by mouth daily. 30 tablet 4   acetaminophen (TYLENOL) 500 MG tablet Take 1,500 mg by mouth every 6 (six) hours as needed for moderate pain.     allopurinol (ZYLOPRIM) 100 MG tablet Take 1 tablet by mouth once daily 90 tablet 0   aspirin 81 MG chewable tablet Chew 1 tablet (81 mg total) by mouth daily.     BELSOMRA 10 MG TABS Take 1 tablet by mouth at bedtime.     Budeson-Glycopyrrol-Formoterol (BREZTRI AEROSPHERE) 160-9-4.8 MCG/ACT AERO INHALE 2 PUFFS TWICE DAILY 11 g 2   celecoxib (CELEBREX) 200 MG capsule Take 200-400 mg by mouth daily as needed.     cetirizine (ZYRTEC) 10 MG tablet TAKE 1 TABLET BY MOUTH ONCE DAILY IN THE MORNING 90 tablet 0   clopidogrel (PLAVIX) 75 MG tablet Take 1 tablet by mouth once daily 90 tablet 0   Colchicine 0.6 MG CAPS USE AS DIRECTED. TAKE 2 CAPSULES FOR THE FIRST DOSE, THEN 1 CAPSULE AN HOUR LATER, THEN 1 CAPSULE DAILY TILL PAIN FREE 30 capsule 2   COMBIVENT RESPIMAT 20-100 MCG/ACT AERS respimat Inhale 1 puff into the lungs every 6 (six) hours as needed for shortness of breath.     fluticasone (FLONASE) 50  MCG/ACT nasal spray Place 1 spray into both nostrils 2 (two) times daily.     furosemide (LASIX) 20 MG tablet Take 1 tablet by mouth once daily 90 tablet 0   HYDROcodone-acetaminophen (NORCO) 5-325 MG tablet Take 1-2 tablets by mouth every 6 (six) hours as needed. 10 tablet 0   isosorbide mononitrate (IMDUR) 30 MG 24 hr tablet Take 1 tablet by mouth once daily 90 tablet 0   lisinopril (ZESTRIL) 40 MG tablet Take 1 tablet by mouth once daily 90 tablet 1   metoprolol tartrate (LOPRESSOR) 25 MG tablet TAKE 1/2 (ONE-HALF) TABLET BY MOUTH TWICE DAILY AS DIRECTED 90 tablet 0   rosuvastatin (CRESTOR) 40 MG tablet Take 1 tablet by mouth in the evening  90 tablet 0   sodium bicarbonate 650 MG tablet Take 1 tablet (650 mg total) by mouth 2 (two) times daily. 100 tablet 1   spironolactone (ALDACTONE) 25 MG tablet Take 1 tablet by mouth once daily 30 tablet 0   traZODone (DESYREL) 50 MG tablet TAKE 1 TABLET BY MOUTH AT BEDTIME AS NEEDED FOR SLEEP 30 tablet 1   No current facility-administered medications for this visit.    Allergies:   Patient has no known allergies.    Social History:   reports that he quit smoking about 44 years ago. His smoking use included cigarettes. He has never used smokeless tobacco. He reports that he does not drink alcohol and does not use drugs.   Family History:  family history includes COPD in his father; Stroke in his brother.    ROS:     Review of Systems  Constitutional: Negative.   HENT: Negative.    Eyes: Negative.   Respiratory: Negative.    Gastrointestinal: Negative.   Genitourinary: Negative.   Musculoskeletal: Negative.   Skin: Negative.   Neurological: Negative.   Endo/Heme/Allergies: Negative.   Psychiatric/Behavioral: Negative.    All other systems reviewed and are negative.     All other systems are reviewed and negative.    PHYSICAL EXAM: VS:  BP 103/61   Pulse 67   Ht 5\' 4"  (1.626 m)   Wt 190 lb 3.2 oz (86.3 kg)   SpO2 98%   BMI 32.65 kg/m  , BMI Body mass index is 32.65 kg/m. Last weight:  Wt Readings from Last 3 Encounters:  09/23/22 190 lb 3.2 oz (86.3 kg)  08/06/22 191 lb 12.8 oz (87 kg)  07/26/22 191 lb 9.6 oz (86.9 kg)     Physical Exam Vitals reviewed.  Constitutional:      Appearance: Normal appearance. He is normal weight.  HENT:     Head: Normocephalic.     Nose: Nose normal.     Mouth/Throat:     Mouth: Mucous membranes are moist.  Eyes:     Pupils: Pupils are equal, round, and reactive to light.  Cardiovascular:     Rate and Rhythm: Normal rate and regular rhythm.     Pulses: Normal pulses.     Heart sounds: Normal heart sounds.  Pulmonary:      Effort: Pulmonary effort is normal.  Abdominal:     General: Abdomen is flat. Bowel sounds are normal.  Musculoskeletal:        General: Normal range of motion.     Cervical back: Normal range of motion.  Skin:    General: Skin is warm.  Neurological:     General: No focal deficit present.     Mental Status: He is alert.  Psychiatric:  Mood and Affect: Mood normal.      EKG:   Recent Labs: 07/12/2022: ALT 31 08/06/2022: BUN 23; Creatinine, Ser 1.66; Hemoglobin 13.7; Platelets 240; Potassium 3.9; Sodium 132    Lipid Panel    Component Value Date/Time   CHOL 102 07/08/2022 1023   TRIG 183 (H) 07/08/2022 1023   HDL 37 (L) 07/08/2022 1023   CHOLHDL 3.9 12/05/2014 0709   VLDL 21 12/05/2014 0709   LDLCALC 35 07/08/2022 1023      Other studies Reviewed: Additional studies/ records that were reviewed today include:  Review of the above records demonstrates:       No data to display            ASSESSMENT AND PLAN:    ICD-10-CM   1. CAD, multiple vessel  I25.10 empagliflozin (JARDIANCE) 25 MG TABS tablet    PCV ECHOCARDIOGRAM COMPLETE    2. Essential hypertension  I10 empagliflozin (JARDIANCE) 25 MG TABS tablet    PCV ECHOCARDIOGRAM COMPLETE    3. Nonrheumatic mitral valve regurgitation  I34.0 empagliflozin (JARDIANCE) 25 MG TABS tablet    PCV ECHOCARDIOGRAM COMPLETE    4. SOB (shortness of breath)  R06.02 empagliflozin (JARDIANCE) 25 MG TABS tablet    PCV ECHOCARDIOGRAM COMPLETE   Feeling better on spironolactone,add jardiance    5. CHF (congestive heart failure), NYHA class III, acute on chronic, diastolic (HCC)  I50.33 empagliflozin (JARDIANCE) 25 MG TABS tablet    PCV ECHOCARDIOGRAM COMPLETE   add jardiance       Problem List Items Addressed This Visit       Cardiovascular and Mediastinum   CAD, multiple vessel - Primary   Relevant Medications   empagliflozin (JARDIANCE) 25 MG TABS tablet   Other Relevant Orders   PCV ECHOCARDIOGRAM  COMPLETE   Essential hypertension   Relevant Medications   empagliflozin (JARDIANCE) 25 MG TABS tablet   Other Relevant Orders   PCV ECHOCARDIOGRAM COMPLETE   Nonrheumatic mitral valve regurgitation   Relevant Medications   empagliflozin (JARDIANCE) 25 MG TABS tablet   Other Relevant Orders   PCV ECHOCARDIOGRAM COMPLETE     Other   SOB (shortness of breath)   Relevant Medications   empagliflozin (JARDIANCE) 25 MG TABS tablet   Other Relevant Orders   PCV ECHOCARDIOGRAM COMPLETE   Other Visit Diagnoses     CHF (congestive heart failure), NYHA class III, acute on chronic, diastolic (HCC)       add jardiance   Relevant Medications   empagliflozin (JARDIANCE) 25 MG TABS tablet   Other Relevant Orders   PCV ECHOCARDIOGRAM COMPLETE          Disposition:   Return in about 4 weeks (around 10/21/2022) for echo and f/u.    Total time spent: 40 minutes  Signed,  Adrian Blackwater, MD  09/23/2022 1:30 PM    [facility]

## 2022-09-28 ENCOUNTER — Emergency Department (HOSPITAL_COMMUNITY): Payer: 59

## 2022-09-28 ENCOUNTER — Inpatient Hospital Stay (HOSPITAL_COMMUNITY)
Admission: EM | Admit: 2022-09-28 | Discharge: 2022-09-30 | DRG: 641 | Disposition: A | Discharge status: Home / Self Care | Payer: 59 | Attending: Internal Medicine | Admitting: Internal Medicine

## 2022-09-28 ENCOUNTER — Encounter (HOSPITAL_COMMUNITY): Payer: Self-pay

## 2022-09-28 ENCOUNTER — Other Ambulatory Visit: Payer: Self-pay

## 2022-09-28 DIAGNOSIS — G8929 Other chronic pain: Secondary | ICD-10-CM | POA: Diagnosis not present

## 2022-09-28 DIAGNOSIS — R933 Abnormal findings on diagnostic imaging of other parts of digestive tract: Secondary | ICD-10-CM | POA: Diagnosis present

## 2022-09-28 DIAGNOSIS — E669 Obesity, unspecified: Secondary | ICD-10-CM | POA: Diagnosis not present

## 2022-09-28 DIAGNOSIS — M48061 Spinal stenosis, lumbar region without neurogenic claudication: Secondary | ICD-10-CM | POA: Diagnosis present

## 2022-09-28 DIAGNOSIS — I2510 Atherosclerotic heart disease of native coronary artery without angina pectoris: Secondary | ICD-10-CM | POA: Diagnosis not present

## 2022-09-28 DIAGNOSIS — I130 Hypertensive heart and chronic kidney disease with heart failure and stage 1 through stage 4 chronic kidney disease, or unspecified chronic kidney disease: Secondary | ICD-10-CM | POA: Diagnosis present

## 2022-09-28 DIAGNOSIS — E860 Dehydration: Principal | ICD-10-CM | POA: Insufficient documentation

## 2022-09-28 DIAGNOSIS — Z7984 Long term (current) use of oral hypoglycemic drugs: Secondary | ICD-10-CM

## 2022-09-28 DIAGNOSIS — N1832 Chronic kidney disease, stage 3b: Secondary | ICD-10-CM | POA: Diagnosis present

## 2022-09-28 DIAGNOSIS — Z87891 Personal history of nicotine dependence: Secondary | ICD-10-CM

## 2022-09-28 DIAGNOSIS — R569 Unspecified convulsions: Secondary | ICD-10-CM | POA: Diagnosis not present

## 2022-09-28 DIAGNOSIS — R1010 Upper abdominal pain, unspecified: Secondary | ICD-10-CM

## 2022-09-28 DIAGNOSIS — Z7982 Long term (current) use of aspirin: Secondary | ICD-10-CM | POA: Diagnosis not present

## 2022-09-28 DIAGNOSIS — I5032 Chronic diastolic (congestive) heart failure: Secondary | ICD-10-CM | POA: Diagnosis present

## 2022-09-28 DIAGNOSIS — M109 Gout, unspecified: Secondary | ICD-10-CM | POA: Diagnosis present

## 2022-09-28 DIAGNOSIS — E782 Mixed hyperlipidemia: Secondary | ICD-10-CM | POA: Diagnosis not present

## 2022-09-28 DIAGNOSIS — Z825 Family history of asthma and other chronic lower respiratory diseases: Secondary | ICD-10-CM

## 2022-09-28 DIAGNOSIS — R55 Syncope and collapse: Secondary | ICD-10-CM | POA: Diagnosis not present

## 2022-09-28 DIAGNOSIS — Z6832 Body mass index (BMI) 32.0-32.9, adult: Secondary | ICD-10-CM

## 2022-09-28 DIAGNOSIS — K5730 Diverticulosis of large intestine without perforation or abscess without bleeding: Secondary | ICD-10-CM | POA: Diagnosis not present

## 2022-09-28 DIAGNOSIS — I10 Essential (primary) hypertension: Secondary | ICD-10-CM | POA: Diagnosis present

## 2022-09-28 DIAGNOSIS — R251 Tremor, unspecified: Secondary | ICD-10-CM | POA: Diagnosis not present

## 2022-09-28 DIAGNOSIS — R1110 Vomiting, unspecified: Secondary | ICD-10-CM | POA: Diagnosis not present

## 2022-09-28 DIAGNOSIS — Z79899 Other long term (current) drug therapy: Secondary | ICD-10-CM

## 2022-09-28 DIAGNOSIS — R935 Abnormal findings on diagnostic imaging of other abdominal regions, including retroperitoneum: Secondary | ICD-10-CM | POA: Diagnosis not present

## 2022-09-28 DIAGNOSIS — N189 Chronic kidney disease, unspecified: Secondary | ICD-10-CM | POA: Insufficient documentation

## 2022-09-28 DIAGNOSIS — I959 Hypotension, unspecified: Secondary | ICD-10-CM | POA: Diagnosis not present

## 2022-09-28 DIAGNOSIS — R6889 Other general symptoms and signs: Secondary | ICD-10-CM | POA: Diagnosis not present

## 2022-09-28 DIAGNOSIS — Z7951 Long term (current) use of inhaled steroids: Secondary | ICD-10-CM

## 2022-09-28 DIAGNOSIS — R404 Transient alteration of awareness: Secondary | ICD-10-CM | POA: Diagnosis not present

## 2022-09-28 DIAGNOSIS — R109 Unspecified abdominal pain: Secondary | ICD-10-CM | POA: Diagnosis not present

## 2022-09-28 DIAGNOSIS — Z7902 Long term (current) use of antithrombotics/antiplatelets: Secondary | ICD-10-CM

## 2022-09-28 DIAGNOSIS — R918 Other nonspecific abnormal finding of lung field: Secondary | ICD-10-CM | POA: Diagnosis not present

## 2022-09-28 DIAGNOSIS — Z823 Family history of stroke: Secondary | ICD-10-CM

## 2022-09-28 DIAGNOSIS — N179 Acute kidney failure, unspecified: Secondary | ICD-10-CM | POA: Insufficient documentation

## 2022-09-28 DIAGNOSIS — R112 Nausea with vomiting, unspecified: Secondary | ICD-10-CM | POA: Insufficient documentation

## 2022-09-28 DIAGNOSIS — I252 Old myocardial infarction: Secondary | ICD-10-CM

## 2022-09-28 DIAGNOSIS — R1013 Epigastric pain: Secondary | ICD-10-CM | POA: Diagnosis not present

## 2022-09-28 DIAGNOSIS — Z743 Need for continuous supervision: Secondary | ICD-10-CM | POA: Diagnosis not present

## 2022-09-28 LAB — CBC
HCT: 44.8 % (ref 39.0–52.0)
Hemoglobin: 14.5 g/dL (ref 13.0–17.0)
MCH: 30.7 pg (ref 26.0–34.0)
MCHC: 32.4 g/dL (ref 30.0–36.0)
MCV: 94.9 fL (ref 80.0–100.0)
Platelets: 216 10*3/uL (ref 150–400)
RBC: 4.72 MIL/uL (ref 4.22–5.81)
RDW: 14.4 % (ref 11.5–15.5)
WBC: 10.1 10*3/uL (ref 4.0–10.5)
nRBC: 0 % (ref 0.0–0.2)

## 2022-09-28 LAB — LIPASE, BLOOD: Lipase: 39 U/L (ref 11–51)

## 2022-09-28 LAB — COMPREHENSIVE METABOLIC PANEL
ALT: 32 U/L (ref 0–44)
AST: 37 U/L (ref 15–41)
Albumin: 4 g/dL (ref 3.5–5.0)
Alkaline Phosphatase: 50 U/L (ref 38–126)
Anion gap: 11 (ref 5–15)
BUN: 27 mg/dL — ABNORMAL HIGH (ref 8–23)
CO2: 22 mmol/L (ref 22–32)
Calcium: 9 mg/dL (ref 8.9–10.3)
Chloride: 100 mmol/L (ref 98–111)
Creatinine, Ser: 2.46 mg/dL — ABNORMAL HIGH (ref 0.61–1.24)
GFR, Estimated: 27 mL/min — ABNORMAL LOW (ref >60)
Glucose, Bld: 101 mg/dL — ABNORMAL HIGH (ref 70–99)
Potassium: 4.3 mmol/L (ref 3.5–5.1)
Sodium: 133 mmol/L — ABNORMAL LOW (ref 135–145)
Total Bilirubin: 1.4 mg/dL — ABNORMAL HIGH (ref 0.3–1.2)
Total Protein: 7 g/dL (ref 6.5–8.1)

## 2022-09-28 LAB — TROPONIN I (HIGH SENSITIVITY): Troponin I (High Sensitivity): 4 ng/L (ref <18)

## 2022-09-28 LAB — CBG MONITORING, ED: Glucose-Capillary: 108 mg/dL — ABNORMAL HIGH (ref 70–99)

## 2022-09-28 MED ORDER — SODIUM CHLORIDE 0.9 % IV BOLUS
500.0000 mL | Freq: Once | INTRAVENOUS | Status: AC
  Administered 2022-09-28: 500 mL via INTRAVENOUS

## 2022-09-28 NOTE — ED Triage Notes (Signed)
Pt brought in by EMS from home for syncopal episode. Pt says had some abd pain, vomited, and then felt like he was going to pass out, pt family told EMS they thought he had seizure because he slumped over for a minute after vomiting and had some shaking, pt reports that he has tremors and thinks he might have passed out. EMS says pt was not postictal and remembers events. Pt reports abd pain is 6 out of 10 right now

## 2022-09-28 NOTE — ED Provider Notes (Signed)
[place] EMERGENCY DEPARTMENT AT [facility] Provider Note   CSN: [id number] Arrival date & time: 09/28/22  2155     History {Add pertinent medical, surgical, social history, OB history to HPI:1} Chief Complaint  Patient presents with   Loss of Consciousness    Gregory [name] is a 77 y.o. male.  He is brought in by ambulance after an unresponsive episode at home.  He said he had acute onset of upper abdominal pain and vomiting, then he slumped over.  Daughter states that side rolled back in his head and he was shaking a little bit, thought he might be having a seizure.  Patient is unsure if he fully lost consciousness.  EMS was called they said he was not postictal.  He still has the upper abdominal pain although it is better.  Daughter states he just started [place] on Monday.  He also said he is having some low back pain and pain down his right leg to his knee that is been going on a month  The history is provided by the patient and a relative.  Loss of Consciousness Episode history:  Single Most recent episode:  Today Progression:  Resolved Chronicity:  New Relieved by:  None tried Worsened by:  Nothing Ineffective treatments:  None tried Associated symptoms: nausea and vomiting   Associated symptoms: no chest pain, no fever, no headaches and no shortness of breath   Risk factors: coronary artery disease        Home Medications Prior to Admission medications   Medication Sig Start Date End Date Taking? Authorizing Provider  acetaminophen (TYLENOL) 500 MG tablet Take 1,500 mg by mouth every 6 (six) hours as needed for moderate pain.    [provider]  allopurinol (ZYLOPRIM) 100 MG tablet Take 1 tablet by mouth once daily 08/08/22   Sherron Monday, MD  aspirin 81 MG chewable tablet Chew 1 tablet (81 mg total) by mouth daily. 12/06/14   Kilroy, Luke K, PA-C  BELSOMRA 10 MG TABS Take 1 tablet by mouth at bedtime. 01/31/22   [provider]   Budeson-Glycopyrrol-Formoterol (BREZTRI AEROSPHERE) 160-9-4.8 MCG/ACT AERO INHALE 2 PUFFS TWICE DAILY 08/15/22   Sherron Monday, MD  celecoxib (CELEBREX) 200 MG capsule Take 200-400 mg by mouth daily as needed. 06/29/20   [provider]  cetirizine (ZYRTEC) 10 MG tablet TAKE 1 TABLET BY MOUTH ONCE DAILY IN THE MORNING 07/26/22   Sherron Monday, MD  clopidogrel (PLAVIX) 75 MG tablet Take 1 tablet by mouth once daily 07/11/22   Laurier Nancy, MD  Colchicine 0.6 MG CAPS USE AS DIRECTED. TAKE 2 CAPSULES FOR THE FIRST DOSE, THEN 1 CAPSULE AN HOUR LATER, THEN 1 CAPSULE DAILY TILL PAIN FREE 08/08/22   Sherron Monday, MD  COMBIVENT RESPIMAT 20-100 MCG/ACT AERS respimat Inhale 1 puff into the lungs every 6 (six) hours as needed for shortness of breath. 01/28/21   [provider]  empagliflozin (JARDIANCE) 25 MG TABS tablet Take 1 tablet (25 mg total) by mouth daily. 09/23/22   Laurier Nancy, MD  fluticasone (FLONASE) 50 MCG/ACT nasal spray Place 1 spray into both nostrils 2 (two) times daily. 11/18/14   [provider]  furosemide (LASIX) 20 MG tablet Take 1 tablet by mouth once daily 08/18/22   Laurier Nancy, MD  HYDROcodone-acetaminophen (NORCO) 5-325 MG tablet Take 1-2 tablets by mouth every 6 (six) hours as needed. 08/06/22   Geoffery Lyons, MD  isosorbide mononitrate (  IMDUR) 30 MG 24 hr tablet Take 1 tablet by mouth once daily 08/08/22   Adrian Blackwater A, MD  lisinopril (ZESTRIL) 40 MG tablet Take 1 tablet by mouth once daily 08/22/22   Sherron Monday, MD  metoprolol tartrate (LOPRESSOR) 25 MG tablet TAKE 1/2 (ONE-HALF) TABLET BY MOUTH TWICE DAILY AS DIRECTED 08/08/22   Sherron Monday, MD  rosuvastatin (CRESTOR) 40 MG tablet Take 1 tablet by mouth in the evening 08/05/22   Sherron Monday, MD  sodium bicarbonate 650 MG tablet Take 1 tablet (650 mg total) by mouth 2 (two) times daily. 07/11/22 07/11/23  Sherron Monday, MD  spironolactone (ALDACTONE) 25 MG tablet  Take 1 tablet by mouth once daily 08/12/22   Laurier Nancy, MD  traZODone (DESYREL) 50 MG tablet TAKE 1 TABLET BY MOUTH AT BEDTIME AS NEEDED FOR SLEEP 09/14/22   Sherron Monday, MD      Allergies    Patient has no known allergies.    Review of Systems   Review of Systems  Constitutional:  Negative for fever.  Eyes:  Negative for visual disturbance.  Respiratory:  Negative for shortness of breath.   Cardiovascular:  Positive for syncope. Negative for chest pain.  Gastrointestinal:  Positive for abdominal pain, nausea and vomiting. Negative for diarrhea.  Genitourinary:  Negative for dysuria.  Musculoskeletal:  Positive for back pain.  Neurological:  Negative for headaches.    Physical Exam Updated Vital Signs BP 98/67   Pulse 66   Temp 98.2 F (36.8 C) (Oral)   Resp 15   Ht 5\' 4"  (1.626 m)   Wt 86.3 kg   SpO2 93%   BMI 32.66 kg/m  Physical Exam Vitals and nursing note reviewed.  Constitutional:      General: He is not in acute distress.    Appearance: Normal appearance. He is well-developed.  HENT:     Head: Normocephalic and atraumatic.  Eyes:     Conjunctiva/sclera: Conjunctivae normal.  Cardiovascular:     Rate and Rhythm: Normal rate and regular rhythm.     Heart sounds: No murmur heard. Pulmonary:     Effort: Pulmonary effort is normal. No respiratory distress.     Breath sounds: Normal breath sounds.  Abdominal:     Palpations: Abdomen is soft.     Tenderness: There is abdominal tenderness (epigastric). There is no guarding or rebound.  Musculoskeletal:        General: No deformity. Normal range of motion.     Cervical back: Neck supple.  Skin:    General: Skin is warm and dry.     Capillary Refill: Capillary refill takes less than 2 seconds.  Neurological:     General: No focal deficit present.     Mental Status: He is alert.     Sensory: No sensory deficit.     Motor: No weakness.     ED Results / Procedures / Treatments   Labs (all labs  ordered are listed, but only abnormal results are displayed) Labs Reviewed  CBG MONITORING, ED - Abnormal; Notable for the following components:      Result Value   Glucose-Capillary 108 (*)    All other components within normal limits  CBC  URINALYSIS, ROUTINE W REFLEX MICROSCOPIC  LIPASE, BLOOD  COMPREHENSIVE METABOLIC PANEL    EKG None  Radiology No results found.  Procedures Procedures  {Document cardiac monitor, telemetry assessment procedure when appropriate:1}  Medications Ordered in ED Medications  sodium chloride  0.9 % bolus 500 mL (has no administration in time range)    ED Course/ Medical Decision Making/ A&P   {   Click here for ABCD2, HEART and other calculatorsREFRESH Note before signing :1}                              Medical Decision Making Amount and/or Complexity of Data Reviewed Labs: ordered. Radiology: ordered.   This patient complains of ***; this involves an extensive number of treatment Options and is a complaint that carries with it a high risk of complications and morbidity. The differential includes ***  I ordered, reviewed and interpreted labs, which included *** I ordered medication *** and reviewed PMP when indicated. I ordered imaging studies which included *** and I independently    visualized and interpreted imaging which showed *** Additional history obtained from *** Previous records obtained and reviewed *** I consulted *** and discussed lab and imaging findings and discussed disposition.  Cardiac monitoring reviewed, *** Social determinants considered, *** Critical Interventions: ***  After the interventions stated above, I reevaluated the patient and found *** Admission and further testing considered, ***   {Document critical care time when appropriate:1} {Document review of labs and clinical decision tools ie heart score, Chads2Vasc2 etc:1}  {Document your independent review of radiology images, and any outside  records:1} {Document your discussion with family members, caretakers, and with consultants:1} {Document social determinants of health affecting pt's care:1} {Document your decision making why or why not admission, treatments were needed:1} Final Clinical Impression(s) / ED Diagnoses Final diagnoses:  None    Rx / DC Orders ED Discharge Orders     None

## 2022-09-29 ENCOUNTER — Observation Stay (HOSPITAL_COMMUNITY): Payer: 59

## 2022-09-29 ENCOUNTER — Other Ambulatory Visit (HOSPITAL_COMMUNITY): Payer: Self-pay | Admitting: *Deleted

## 2022-09-29 ENCOUNTER — Observation Stay (HOSPITAL_COMMUNITY)
Admit: 2022-09-29 | Discharge: 2022-09-29 | Disposition: A | Discharge status: Home / Self Care | Payer: 59 | Attending: Internal Medicine | Admitting: Internal Medicine

## 2022-09-29 ENCOUNTER — Telehealth: Payer: Self-pay | Admitting: Gastroenterology

## 2022-09-29 DIAGNOSIS — I130 Hypertensive heart and chronic kidney disease with heart failure and stage 1 through stage 4 chronic kidney disease, or unspecified chronic kidney disease: Secondary | ICD-10-CM | POA: Diagnosis present

## 2022-09-29 DIAGNOSIS — R1013 Epigastric pain: Secondary | ICD-10-CM | POA: Diagnosis not present

## 2022-09-29 DIAGNOSIS — N189 Chronic kidney disease, unspecified: Secondary | ICD-10-CM | POA: Diagnosis not present

## 2022-09-29 DIAGNOSIS — N1832 Chronic kidney disease, stage 3b: Secondary | ICD-10-CM | POA: Diagnosis present

## 2022-09-29 DIAGNOSIS — Z7982 Long term (current) use of aspirin: Secondary | ICD-10-CM | POA: Diagnosis not present

## 2022-09-29 DIAGNOSIS — I959 Hypotension, unspecified: Secondary | ICD-10-CM | POA: Diagnosis present

## 2022-09-29 DIAGNOSIS — N179 Acute kidney failure, unspecified: Secondary | ICD-10-CM | POA: Diagnosis not present

## 2022-09-29 DIAGNOSIS — Z823 Family history of stroke: Secondary | ICD-10-CM | POA: Diagnosis not present

## 2022-09-29 DIAGNOSIS — Z7984 Long term (current) use of oral hypoglycemic drugs: Secondary | ICD-10-CM | POA: Diagnosis not present

## 2022-09-29 DIAGNOSIS — R55 Syncope and collapse: Secondary | ICD-10-CM | POA: Diagnosis not present

## 2022-09-29 DIAGNOSIS — M109 Gout, unspecified: Secondary | ICD-10-CM | POA: Diagnosis present

## 2022-09-29 DIAGNOSIS — M48061 Spinal stenosis, lumbar region without neurogenic claudication: Secondary | ICD-10-CM | POA: Diagnosis not present

## 2022-09-29 DIAGNOSIS — E782 Mixed hyperlipidemia: Secondary | ICD-10-CM | POA: Diagnosis present

## 2022-09-29 DIAGNOSIS — R933 Abnormal findings on diagnostic imaging of other parts of digestive tract: Secondary | ICD-10-CM | POA: Diagnosis present

## 2022-09-29 DIAGNOSIS — Z6832 Body mass index (BMI) 32.0-32.9, adult: Secondary | ICD-10-CM | POA: Diagnosis not present

## 2022-09-29 DIAGNOSIS — E860 Dehydration: Secondary | ICD-10-CM | POA: Diagnosis not present

## 2022-09-29 DIAGNOSIS — M4187 Other forms of scoliosis, lumbosacral region: Secondary | ICD-10-CM | POA: Diagnosis not present

## 2022-09-29 DIAGNOSIS — E669 Obesity, unspecified: Secondary | ICD-10-CM | POA: Diagnosis present

## 2022-09-29 DIAGNOSIS — R935 Abnormal findings on diagnostic imaging of other abdominal regions, including retroperitoneum: Secondary | ICD-10-CM

## 2022-09-29 DIAGNOSIS — I2510 Atherosclerotic heart disease of native coronary artery without angina pectoris: Secondary | ICD-10-CM

## 2022-09-29 DIAGNOSIS — R569 Unspecified convulsions: Secondary | ICD-10-CM | POA: Diagnosis not present

## 2022-09-29 DIAGNOSIS — R112 Nausea with vomiting, unspecified: Secondary | ICD-10-CM

## 2022-09-29 DIAGNOSIS — M47816 Spondylosis without myelopathy or radiculopathy, lumbar region: Secondary | ICD-10-CM | POA: Diagnosis not present

## 2022-09-29 DIAGNOSIS — R109 Unspecified abdominal pain: Secondary | ICD-10-CM | POA: Diagnosis not present

## 2022-09-29 DIAGNOSIS — G8929 Other chronic pain: Secondary | ICD-10-CM | POA: Diagnosis present

## 2022-09-29 DIAGNOSIS — I10 Essential (primary) hypertension: Secondary | ICD-10-CM | POA: Diagnosis not present

## 2022-09-29 DIAGNOSIS — I252 Old myocardial infarction: Secondary | ICD-10-CM | POA: Diagnosis not present

## 2022-09-29 DIAGNOSIS — Z87891 Personal history of nicotine dependence: Secondary | ICD-10-CM | POA: Diagnosis not present

## 2022-09-29 DIAGNOSIS — R251 Tremor, unspecified: Secondary | ICD-10-CM | POA: Diagnosis present

## 2022-09-29 DIAGNOSIS — K5730 Diverticulosis of large intestine without perforation or abscess without bleeding: Secondary | ICD-10-CM | POA: Diagnosis not present

## 2022-09-29 DIAGNOSIS — I5032 Chronic diastolic (congestive) heart failure: Secondary | ICD-10-CM | POA: Diagnosis present

## 2022-09-29 DIAGNOSIS — Z825 Family history of asthma and other chronic lower respiratory diseases: Secondary | ICD-10-CM | POA: Diagnosis not present

## 2022-09-29 DIAGNOSIS — R404 Transient alteration of awareness: Secondary | ICD-10-CM

## 2022-09-29 DIAGNOSIS — Z7902 Long term (current) use of antithrombotics/antiplatelets: Secondary | ICD-10-CM | POA: Diagnosis not present

## 2022-09-29 DIAGNOSIS — M5135 Other intervertebral disc degeneration, thoracolumbar region: Secondary | ICD-10-CM | POA: Diagnosis not present

## 2022-09-29 LAB — LACTIC ACID, PLASMA: Lactic Acid, Venous: 1.7 mmol/L (ref 0.5–1.9)

## 2022-09-29 LAB — COMPREHENSIVE METABOLIC PANEL
ALT: 27 U/L (ref 0–44)
AST: 32 U/L (ref 15–41)
Albumin: 3.3 g/dL — ABNORMAL LOW (ref 3.5–5.0)
Alkaline Phosphatase: 40 U/L (ref 38–126)
Anion gap: 11 (ref 5–15)
BUN: 26 mg/dL — ABNORMAL HIGH (ref 8–23)
CO2: 21 mmol/L — ABNORMAL LOW (ref 22–32)
Calcium: 8.8 mg/dL — ABNORMAL LOW (ref 8.9–10.3)
Chloride: 102 mmol/L (ref 98–111)
Creatinine, Ser: 2.13 mg/dL — ABNORMAL HIGH (ref 0.61–1.24)
GFR, Estimated: 32 mL/min — ABNORMAL LOW (ref >60)
Glucose, Bld: 86 mg/dL (ref 70–99)
Potassium: 4 mmol/L (ref 3.5–5.1)
Sodium: 134 mmol/L — ABNORMAL LOW (ref 135–145)
Total Bilirubin: 1.5 mg/dL — ABNORMAL HIGH (ref 0.3–1.2)
Total Protein: 5.8 g/dL — ABNORMAL LOW (ref 6.5–8.1)

## 2022-09-29 LAB — URINALYSIS, ROUTINE W REFLEX MICROSCOPIC
Bacteria, UA: NONE SEEN
Bilirubin Urine: NEGATIVE
Glucose, UA: >=500 mg/dL — AB
Hgb urine dipstick: NEGATIVE
Ketones, ur: NEGATIVE mg/dL
Leukocytes,Ua: NEGATIVE
Nitrite: NEGATIVE
Protein, ur: NEGATIVE mg/dL
Specific Gravity, Urine: 1.009 (ref 1.005–1.030)
pH: 6 (ref 5.0–8.0)

## 2022-09-29 LAB — CBC
HCT: 40.5 % (ref 39.0–52.0)
Hemoglobin: 13.2 g/dL (ref 13.0–17.0)
MCH: 30.9 pg (ref 26.0–34.0)
MCHC: 32.6 g/dL (ref 30.0–36.0)
MCV: 94.8 fL (ref 80.0–100.0)
Platelets: 195 10*3/uL (ref 150–400)
RBC: 4.27 MIL/uL (ref 4.22–5.81)
RDW: 14.3 % (ref 11.5–15.5)
WBC: 10.4 10*3/uL (ref 4.0–10.5)
nRBC: 0 % (ref 0.0–0.2)

## 2022-09-29 LAB — PROCALCITONIN: Procalcitonin: <0.1 ng/mL

## 2022-09-29 LAB — PHOSPHORUS: Phosphorus: 3.2 mg/dL (ref 2.5–4.6)

## 2022-09-29 LAB — CK: Total CK: 266 U/L (ref 49–397)

## 2022-09-29 LAB — MAGNESIUM
Magnesium: 2.2 mg/dL (ref 1.7–2.4)
Magnesium: 2.3 mg/dL (ref 1.7–2.4)

## 2022-09-29 LAB — HEMOGLOBIN A1C
Hgb A1c MFr Bld: 5.7 % — ABNORMAL HIGH (ref 4.8–5.6)
Mean Plasma Glucose: 116.89 mg/dL

## 2022-09-29 LAB — TROPONIN I (HIGH SENSITIVITY): Troponin I (High Sensitivity): 5 ng/L (ref <18)

## 2022-09-29 MED ORDER — LACTATED RINGERS IV SOLN: INTRAVENOUS | Status: DC

## 2022-09-29 MED ORDER — ACETAMINOPHEN 650 MG RE SUPP: 650.0000 mg | Freq: Four times a day (QID) | RECTAL | Status: DC | PRN

## 2022-09-29 MED ORDER — LACTATED RINGERS IV BOLUS
1000.0000 mL | Freq: Once | INTRAVENOUS | Status: AC
  Administered 2022-09-29: 1000 mL via INTRAVENOUS

## 2022-09-29 MED ORDER — ORAL CARE MOUTH RINSE: 15.0000 mL | OROMUCOSAL | Status: DC | PRN

## 2022-09-29 MED ORDER — SODIUM CHLORIDE 0.9 % IV SOLN
INTRAVENOUS | Status: AC
  Administered 2022-09-29: 1000 mL via INTRAVENOUS

## 2022-09-29 MED ORDER — PANTOPRAZOLE SODIUM 40 MG IV SOLR
40.0000 mg | INTRAVENOUS | Status: DC
  Administered 2022-09-29: 40 mg via INTRAVENOUS
  Filled 2022-09-29: qty 10

## 2022-09-29 MED ORDER — IPRATROPIUM-ALBUTEROL 0.5-2.5 (3) MG/3ML IN SOLN
3.0000 mL | Freq: Three times a day (TID) | RESPIRATORY_TRACT | Status: DC
  Administered 2022-09-29: 3 mL via RESPIRATORY_TRACT
  Filled 2022-09-29: qty 3

## 2022-09-29 MED ORDER — FENTANYL CITRATE PF 50 MCG/ML IJ SOSY: 12.5000 ug | PREFILLED_SYRINGE | INTRAMUSCULAR | Status: DC | PRN

## 2022-09-29 MED ORDER — ASPIRIN 81 MG PO CHEW
81.0000 mg | CHEWABLE_TABLET | Freq: Every day | ORAL | Status: DC
  Administered 2022-09-29 – 2022-09-30 (×2): 81 mg via ORAL
  Filled 2022-09-29 (×2): qty 1

## 2022-09-29 MED ORDER — IPRATROPIUM-ALBUTEROL 0.5-2.5 (3) MG/3ML IN SOLN
3.0000 mL | Freq: Two times a day (BID) | RESPIRATORY_TRACT | Status: DC
  Administered 2022-09-29 – 2022-09-30 (×2): 3 mL via RESPIRATORY_TRACT
  Filled 2022-09-29 (×2): qty 3

## 2022-09-29 MED ORDER — ENOXAPARIN SODIUM 30 MG/0.3ML IJ SOSY
30.0000 mg | PREFILLED_SYRINGE | INTRAMUSCULAR | Status: DC
  Administered 2022-09-29 – 2022-09-30 (×2): 30 mg via SUBCUTANEOUS
  Filled 2022-09-29 (×2): qty 0.3

## 2022-09-29 MED ORDER — ROSUVASTATIN CALCIUM 20 MG PO TABS
40.0000 mg | ORAL_TABLET | Freq: Every evening | ORAL | Status: DC
  Administered 2022-09-29: 40 mg via ORAL
  Filled 2022-09-29: qty 2

## 2022-09-29 MED ORDER — ONDANSETRON HCL 4 MG PO TABS: 4.0000 mg | ORAL_TABLET | Freq: Four times a day (QID) | ORAL | Status: DC | PRN

## 2022-09-29 MED ORDER — ONDANSETRON HCL 4 MG/2ML IJ SOLN: 4.0000 mg | Freq: Four times a day (QID) | INTRAMUSCULAR | Status: DC | PRN

## 2022-09-29 MED ORDER — CLOPIDOGREL BISULFATE 75 MG PO TABS
75.0000 mg | ORAL_TABLET | Freq: Every day | ORAL | Status: DC
  Administered 2022-09-29 – 2022-09-30 (×2): 75 mg via ORAL
  Filled 2022-09-29 (×2): qty 1

## 2022-09-29 MED ORDER — ACETAMINOPHEN 325 MG PO TABS: 650.0000 mg | ORAL_TABLET | Freq: Four times a day (QID) | ORAL | Status: DC | PRN

## 2022-09-29 NOTE — Plan of Care (Signed)
  Problem: Education: Goal: Knowledge of General Education information will improve Description Including pain rating scale, medication(s)/side effects and non-pharmacologic comfort measures Outcome: Progressing   Problem: Health Behavior/Discharge Planning: Goal: Ability to manage health-related needs will improve Outcome: Progressing   

## 2022-09-29 NOTE — Procedures (Addendum)
Patient Name: SHIVANK BENCE  MRN: [id number]  Epilepsy Attending: Charlsie Quest  Referring Physician/Provider: Catarina Hartshorn, MD  Date: 09/29/2022 Duration: 22.09 mins  Patient history: 77 y.o. male with medical history significant of hypertension, CAD, gout who presents to the emergency department from home via EMS due to near syncopal episode.  EEG to evaluate for seizure.  Level of alertness: Awake, asleep  AEDs during EEG study: None  Technical aspects: This EEG study was done with scalp electrodes positioned according to the 10-20 International system of electrode placement. Electrical activity was reviewed with band pass filter of 1-70Hz , sensitivity of 7 uV/mm, display speed of 106mm/sec with a 60Hz  notched filter applied as appropriate. EEG data were recorded continuously and digitally stored.  Video monitoring was available and reviewed as appropriate.  Description: The posterior dominant rhythm consists of 8-9 Hz activity of moderate voltage (25-35 uV) seen predominantly in posterior head regions, symmetric and reactive to eye opening and eye closing. Sleep was characterized by vertex waves, sleep spindles (12 to 14 Hz), maximal frontocentral region.  Hyperventilation and photic stimulation were not performed.     IMPRESSION: This study is within normal limits. No seizures or epileptiform discharges were seen throughout the recording.  Raymonde Hamblin Annabelle Harman

## 2022-09-29 NOTE — Hospital Course (Signed)
77 year old male with history of hypertension, coronary artery disease, diastolic CHF presenting with a near syncopal episode.  On the evening of 09/28/2022, the patient developed acute onset of upper abdominal pain with associated nausea and vomiting.  He is unable to clarify him any times he threw up.  There is no hematemesis.  He denied any previous abdominal pain or vomiting prior to the episode described above.  He denies any new medications except Jardiance which was started on 09/23/2022.  He states that he was sitting on the recliner at the onset of the above symptoms.  He is not sure whether he completely lost consciousness.  There was concern that the patient had some shaking of his upper extremities and his eyes were rolled into the back of his head.  There was no bowel or bladder incontinence.  The patient did not bite his tongue.  He stated that his hands were still shaking at the time he arrived in the emergency department.  At that time, the patient was awake and conversant. Upon further questioning, the patient states that he has had the shaking episodes of his upper extremities for over a year.  They can last anywhere from 1 hour to "all day".  He states that his shaking episodes are usually more prevalent when he is stressed out.  There is no loss consciousness. He states that he still had abdominal pain up until this morning.  He has had no further vomiting.  He denies any diarrhea, hematochezia, melena.  He denies any chest pain or shortness of breath.  There is no coughing or hemoptysis.  He has not had any fevers or chills, headache, dizziness. In the ED, the patient was afebrile, but did have low BP down to 80/65.  Oxygen saturation was 100% on room air.  WBC 10.1, hemoglobin 14.5, platelets 216,000.  Sodium 133, potassium 4.3, bicarbonate 22, serum creatinine 2.46.  Troponin 4>> 5.  AST 37, ALT 32, alk phosphatase 50, T. bili 1.4, lipase 39.  CT of the abdomen and pelvis showed bowel wall  thickening with proximal transverse colon.  Pancreas was normal.  There is no hydronephrosis.  The patient was admitted for further evaluation and treatment.

## 2022-09-29 NOTE — Plan of Care (Signed)
  Problem: Acute Rehab PT Goals(only PT should resolve) Goal: Pt Will Go Supine/Side To Sit Outcome: Progressing Flowsheets (Taken 09/29/2022 1048) Pt will go Supine/Side to Sit: Independently Goal: Patient Will Transfer Sit To/From Stand Outcome: Progressing Flowsheets (Taken 09/29/2022 1048) Patient will transfer sit to/from stand: with supervision Goal: Pt Will Transfer Bed To Chair/Chair To Bed Outcome: Progressing Flowsheets (Taken 09/29/2022 1048) Pt will Transfer Bed to Chair/Chair to Bed: with supervision Goal: Pt Will Ambulate Outcome: Progressing Flowsheets (Taken 09/29/2022 1048) Pt will Ambulate:  50 feet  with least restrictive assistive device  with supervision

## 2022-09-29 NOTE — Telephone Encounter (Signed)
Patient needs hospital follow up with Dr. Tasia Catchings (per Dr. Tasia Catchings) for abdominal pain, abnormal CT.

## 2022-09-29 NOTE — TOC Initial Note (Signed)
Transition of Care [facility]) - Initial/Assessment Note    Patient Details  Name: Gregory [name] MRN: [id number] Date of Birth: Aug 17, 1945  Transition of Care [facility]) CM/SW Contact:    Leitha Bleak, RN Phone Number: 09/29/2022, 12:00 PM  Clinical Narrative:  Patient admitted with syncope. Live alone. PT is recommending HHPT.  CM spoke with patient, he really does not want home health at this time. He will follow up with PCP and ask them to order if needed.                 Barriers to Discharge: Barriers Resolved  Patient Goals and CMS Choice   CMS Medicare.gov Compare Post Acute Care list provided to:: Patient     Expected Discharge Plan and Services      Living arrangements for the past 2 months: Single Family Home        Prior Living Arrangements/Services Living arrangements for the past 2 months: Single Family Home Lives with:: Self          Activities of Daily Living Home Assistive Devices/Equipment: Cane (specify quad or straight), Walker (specify type) ADL Screening (condition at time of admission) Patient's cognitive ability adequate to safely complete daily activities?: Yes Is the patient deaf or have difficulty hearing?: No Does the patient have difficulty seeing, even when wearing glasses/contacts?: No Does the patient have difficulty concentrating, remembering, or making decisions?: No Patient able to express need for assistance with ADLs?: Yes Does the patient have difficulty dressing or bathing?: No Independently performs ADLs?: Yes (appropriate for developmental age) Does the patient have difficulty walking or climbing stairs?: Yes Weakness of Legs: Right Weakness of Arms/Hands: None  Permission Sought/Granted   Emotional Assessment    Orientation: : Oriented to Self, Oriented to Place, Oriented to  Time, Oriented to Situation Alcohol / Substance Use: Not Applicable Psych Involvement: No (comment)  Admission diagnosis:  Syncope [R55] Patient Active  Problem List   Diagnosis Date Noted   Abdominal pain 09/29/2022   Nausea & vomiting 09/29/2022   Acute kidney injury superimposed on chronic kidney disease (HCC) 09/29/2022   Dehydration 09/29/2022   Obesity (BMI 30-39.9) 09/29/2022   CKD (chronic kidney disease) stage 4, GFR 15-29 ml/min (HCC) 07/11/2022   Nonrheumatic mitral valve regurgitation 05/24/2022   SOB (shortness of breath) 05/24/2022   Unstable angina (HCC) 09/20/2021   Syncope 08/31/2020   NSTEMI (non-ST elevated myocardial infarction) (HCC) 12/06/2014   CAD, multiple vessel 12/06/2014   Dyslipidemia 12/06/2014   Essential hypertension 12/06/2014   Elevated troponin 12/04/2014   ACS (acute coronary syndrome) (HCC) 12/04/2014   History of inguinal hernia repair, 09/09/2012 09/28/2012   PCP:  Sherron Monday, MD Pharmacy:   [facility] [address], [place] - [address] [place] [address] Phone: [phone] Fax: [phone]    Social Determinants of Health (SDOH) Social History: SDOH Screenings   Food Insecurity: No Food Insecurity (09/29/2022)  Housing: Medium Risk (09/29/2022)  Transportation Needs: No Transportation Needs (09/29/2022)  Utilities: Not At Risk (09/29/2022)  Tobacco Use: Medium Risk (09/28/2022)   SDOH Interventions:

## 2022-09-29 NOTE — Plan of Care (Signed)
Pt alert and oriented x 4. Pt reports using cane at home due to RLE weakenss. Denied pain upon arrival to floor. No nausea/vomiting. LR at 125 infusing. SCD on.  Problem: Education: Goal: Knowledge of General Education information will improve Description: Including pain rating scale, medication(s)/side effects and non-pharmacologic comfort measures Outcome: Progressing   Problem: Health Behavior/Discharge Planning: Goal: Ability to manage health-related needs will improve Outcome: Progressing   Problem: Clinical Measurements: Goal: Ability to maintain clinical measurements within normal limits will improve Outcome: Progressing Goal: Will remain free from infection Outcome: Progressing Goal: Diagnostic test results will improve Outcome: Progressing Goal: Respiratory complications will improve Outcome: Progressing Goal: Cardiovascular complication will be avoided Outcome: Progressing   Problem: Activity: Goal: Risk for activity intolerance will decrease Outcome: Progressing   Problem: Nutrition: Goal: Adequate nutrition will be maintained Outcome: Progressing   Problem: Coping: Goal: Level of anxiety will decrease Outcome: Progressing   Problem: Elimination: Goal: Will not experience complications related to bowel motility Outcome: Progressing Goal: Will not experience complications related to urinary retention Outcome: Progressing   Problem: Pain Managment: Goal: General experience of comfort will improve Outcome: Progressing   Problem: Safety: Goal: Ability to remain free from injury will improve Outcome: Progressing   Problem: Skin Integrity: Goal: Risk for impaired skin integrity will decrease Outcome: Progressing

## 2022-09-29 NOTE — ED Notes (Signed)
ED TO INPATIENT HANDOFF REPORT  ED Nurse Name and Phone #: Louie Casa Name/Age/Gender Gregory [name] 77 y.o. male Room/Bed: APA05/APA05  Code Status   Code Status: Prior  Home/SNF/Other Home Patient oriented to: self, place, time, and situation Is this baseline? Yes   Triage Complete: Triage complete  Chief Complaint Syncope [R55]  Triage Note Pt brought in by EMS from home for syncopal episode. Pt says had some abd pain, vomited, and then felt like he was going to pass out, pt family told EMS they thought he had seizure because he slumped over for a minute after vomiting and had some shaking, pt reports that he has tremors and thinks he might have passed out. EMS says pt was not postictal and remembers events. Pt reports abd pain is 6 out of 10 right now   Allergies No Known Allergies  Level of Care/Admitting Diagnosis ED Disposition     ED Disposition  Admit   Condition  --   Comment  Hospital Area: [facility] [[id number]]  Level of Care: Telemetry [5]  Covid Evaluation: Asymptomatic - no recent exposure (last 10 days) testing not required  Diagnosis: Syncope [206001]  Admitting Physician: Frankey Shown [[id number]]  Attending Physician: Frankey Shown [[id number]]          B Medical/Surgery History 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   Pneumothorax    Associated with pneumonia   Past Surgical History:  Procedure Laterality Date   Arm surgery     CARDIAC CATHETERIZATION N/A 12/05/2014   Procedure: Left Heart Cath and Coronary Angiography;  Surgeon: Lyn Records, MD;  Location: [facility] INVASIVE CV LAB;  Service: Cardiovascular;  Laterality: N/A;   CATARACT EXTRACTION W/PHACO Right 01/14/2013   Procedure: RIGHT EYE CATARACT EXTRACTION PHACO AND INTRAOCULAR LENS PLACEMENT ;  Surgeon: Gemma Payor, MD;  Location: [facility] ORS;  Service: Ophthalmology;   Laterality: Right;  CDE 54.97   CATARACT EXTRACTION W/PHACO Left 02/18/2013   Procedure: CATARACT EXTRACTION PHACO AND INTRAOCULAR LENS PLACEMENT (IOC);  Surgeon: Gemma Payor, MD;  Location: [facility] ORS;  Service: Ophthalmology;  Laterality: Left;  CDE 34.02   COLONOSCOPY WITH PROPOFOL N/A 07/13/2016   Procedure: COLONOSCOPY WITH PROPOFOL;  Surgeon: Scot Jun, MD;  Location: [facility] ENDOSCOPY;  Service: Endoscopy;  Laterality: N/A;   INGUINAL HERNIA REPAIR Right 09/09/2012   Procedure: HERNIA REPAIR INGUINAL INCARCERATED;  Surgeon: Kandis Cocking, MD;  Location: [facility] ORS;  Service: General;  Laterality: Right;   INSERTION OF MESH Right 09/09/2012   Procedure: INSERTION OF MESH;  Surgeon: Kandis Cocking, MD;  Location: [facility] ORS;  Service: General;  Laterality: Right;   LEFT HEART CATH AND CORONARY ANGIOGRAPHY Right 09/21/2021   Procedure: LEFT HEART CATH AND CORONARY ANGIOGRAPHY with intervention;  Surgeon: Laurier Nancy, MD;  Location: [facility] INVASIVE CV LAB;  Service: Cardiovascular;  Laterality: Right;     A IV Location/Drains/Wounds Patient Lines/Drains/Airways Status     Active Line/Drains/Airways     Name Placement date Placement time Site Days   Peripheral IV 09/28/22 20 G Left Antecubital 09/28/22  2205  Antecubital  1            Intake/Output Last 24 hours No intake or output data in the 24 hours ending 09/29/22 1914  Labs/Imaging Results for orders placed or performed during the hospital encounter of 09/28/22 (from  the past 48 hour(s))  CBG monitoring, ED     Status: Abnormal   Collection Time: 09/28/22 10:04 PM  Result Value Ref Range   Glucose-Capillary 108 (H) 70 - 99 mg/dL    Comment: Glucose reference range applies only to samples taken after fasting for at least 8 hours.  CBC     Status: None   Collection Time: 09/28/22 10:29 PM  Result Value Ref Range   WBC 10.1 4.0 - 10.5 K/uL   RBC 4.72 4.22 - 5.81 MIL/uL   Hemoglobin 14.5 13.0 - 17.0 g/dL   HCT 16.1 09.6 - 04.5 %    MCV 94.9 80.0 - 100.0 fL   MCH 30.7 26.0 - 34.0 pg   MCHC 32.4 30.0 - 36.0 g/dL   RDW 40.9 81.1 - 91.4 %   Platelets 216 150 - 400 K/uL   nRBC 0.0 0.0 - 0.2 %    Comment: Performed at [facility], [address], [place], [place] [address]  Lipase, blood     Status: None   Collection Time: 09/28/22 10:29 PM  Result Value Ref Range   Lipase 39 11 - 51 U/L    Comment: Performed at [facility] [address] [place], [place] [address]  Comprehensive metabolic panel     Status: Abnormal   Collection Time: 09/28/22 10:29 PM  Result Value Ref Range   Sodium 133 (L) 135 - 145 mmol/L   Potassium 4.3 3.5 - 5.1 mmol/L   Chloride 100 98 - 111 mmol/L   CO2 22 22 - 32 mmol/L   Glucose, Bld 101 (H) 70 - 99 mg/dL    Comment: Glucose reference range applies only to samples taken after fasting for at least 8 hours.   BUN 27 (H) 8 - 23 mg/dL   Creatinine, Ser 8.65 (H) 0.61 - 1.24 mg/dL   Calcium 9.0 8.9 - 78.4 mg/dL   Total Protein 7.0 6.5 - 8.1 g/dL   Albumin 4.0 3.5 - 5.0 g/dL   AST 37 15 - 41 U/L   ALT 32 0 - 44 U/L   Alkaline Phosphatase 50 38 - 126 U/L   Total Bilirubin 1.4 (H) 0.3 - 1.2 mg/dL   GFR, Estimated 27 (L) >60 mL/min    Comment: (NOTE) Calculated using the CKD-EPI Creatinine Equation (2021)    Anion gap 11 5 - 15    Comment: Performed at [facility], [address] [place], [place] [address]  Troponin I (High Sensitivity)     Status: None   Collection Time: 09/28/22 10:29 PM  Result Value Ref Range   Troponin I (High Sensitivity) 4 <18 ng/L    Comment: (NOTE) Elevated high sensitivity troponin I (hsTnI) values and significant  changes across serial measurements may suggest ACS but many other  chronic and acute conditions are known to elevate hsTnI results.  Refer to the "Links" section for chest pain algorithms and additional  guidance. Performed at [facility], [address], [place], [place] [address]   Troponin I (High Sensitivity)     Status: None    Collection Time: 09/29/22 12:43 AM  Result Value Ref Range   Troponin I (High Sensitivity) 5 <18 ng/L    Comment: (NOTE) Elevated high sensitivity troponin I (hsTnI) values and significant  changes across serial measurements may suggest ACS but many other  chronic and acute conditions are known to elevate hsTnI results.  Refer to the "Links" section for chest pain algorithms and additional  guidance. Performed at  [facility] [place], [address] [place], [place] [address]   Urinalysis, Routine w reflex microscopic -Urine, Clean Catch     Status: Abnormal   Collection Time: 09/29/22  1:39 AM  Result Value Ref Range   Color, Urine YELLOW YELLOW   APPearance CLEAR CLEAR   Specific Gravity, Urine 1.009 1.005 - 1.030   pH 6.0 5.0 - 8.0   Glucose, UA >=500 (A) NEGATIVE mg/dL   Hgb urine dipstick NEGATIVE NEGATIVE   Bilirubin Urine NEGATIVE NEGATIVE   Ketones, ur NEGATIVE NEGATIVE mg/dL   Protein, ur NEGATIVE NEGATIVE mg/dL   Nitrite NEGATIVE NEGATIVE   Leukocytes,Ua NEGATIVE NEGATIVE   RBC / HPF 0-5 0 - 5 RBC/hpf   WBC, UA 0-5 0 - 5 WBC/hpf   Bacteria, UA NONE SEEN NONE SEEN   Squamous Epithelial / HPF 0-5 0 - 5 /HPF    Comment: Performed at [facility], [address], [place], [place] [address]   CT ABDOMEN PELVIS WO CONTRAST  Result Date: 09/29/2022 CLINICAL DATA:  Abdominal pain, acute, nonlocalized Pt says had some abd pain, vomited, and then felt like he was going to pass out, EXAM: CT ABDOMEN AND PELVIS WITHOUT CONTRAST TECHNIQUE: Multidetector CT imaging of the abdomen and pelvis was performed following the standard protocol without IV contrast. RADIATION DOSE REDUCTION: This exam was performed according to the departmental dose-optimization program which includes automated exposure control, adjustment of the mA and/or kV according to patient size and/or use of iterative reconstruction technique. COMPARISON:  CT renal 08/01/2020 FINDINGS: Lower chest: No acute abnormality.  Coronary  artery calcification. Hepatobiliary: No focal liver abnormality. No gallstones, gallbladder wall thickening, or pericholecystic fluid. No biliary dilatation. Pancreas: No focal lesion. Normal pancreatic contour. No surrounding inflammatory changes. No main pancreatic ductal dilatation. Spleen: Normal in size without focal abnormality. Adrenals/Urinary Tract: No adrenal nodule bilaterally. No nephrolithiasis and no hydronephrosis. There is a stable in size 0.9 cm left renal lesion with a density of 37 Hounsfield units. Bilateral fluid density lesions likely represent simple renal cysts. Simple renal cysts, in the absence of clinically indicated signs/symptoms, require no independent follow-up. No ureterolithiasis or hydroureter. The urinary bladder is unremarkable. Stomach/Bowel: Stomach is within normal limits. No evidence of small bowel wall thickening or dilatation. Bowel thickening of the proximal transverse colon likely peristalsis of the proximal transverse colon (5:19). No large bowel dilatation. Colonic diverticulosis. Appendix appears normal. Vascular/Lymphatic: No abdominal aorta or iliac aneurysm. Severe atherosclerotic plaque of the aorta and its branches. No abdominal, pelvic, or inguinal lymphadenopathy. Reproductive: Prostate is unremarkable. Other: No intraperitoneal free fluid. No intraperitoneal free gas. No organized fluid collection. Musculoskeletal: No abdominal wall hernia or abnormality. No suspicious lytic or blastic osseous lesions. No acute displaced fracture. Multilevel degenerative changes of the spine. IMPRESSION: 1. Bowel wall thickening of the proximal transverse colon likely peristalsis. Underlying mass cannot be fully excluded. If clinically indicated, consider follow-up with repeat CT with intravenous contrast versus colonoscopy. 2. Stable in size indeterminate 0.9 cm left renal lesion. When the patient is clinically stable and able to follow directions and hold their breath  (preferably as an outpatient) further evaluation with dedicated MRI renal protocol should be considered. 3.  Aortic Atherosclerosis (ICD10-I70.0)-severe. Electronically Signed   By: Tish Frederickson M.D.   On: 09/29/2022 00:58   DG Chest Port 1 View  Result Date: 09/28/2022 CLINICAL DATA:  Upper abdominal pain. Syncopal episode. Vomiting. Possible seizure. EXAM: PORTABLE CHEST 1 VIEW COMPARISON:  08/06/2022 FINDINGS: Heart size and pulmonary  vascularity are normal. Probable atelectasis in the right lung base. Left lung is clear. No pleural effusions. No pneumothorax. Mediastinal contours appear intact. IMPRESSION: Probable atelectasis in the right base.  No focal consolidation. Electronically Signed   By: Burman Nieves M.D.   On: 09/28/2022 23:25    Pending Labs Unresulted Labs (From admission, onward)    None       Vitals/Pain Today's Vitals   09/29/22 0215 09/29/22 0230 09/29/22 0243 09/29/22 0245  BP: (!) 84/61 (!) 80/65    Pulse: 67 68  64  Resp: 13 12  18   Temp:   98.4 F (36.9 C)   TempSrc:   Oral   SpO2: 91% 90%  92%  Weight:      Height:      PainSc:        Isolation Precautions No active isolations  Medications Medications  lactated ringers bolus 1,000 mL (1,000 mLs Intravenous New Bag/Given 09/29/22 0242)    Followed by  lactated ringers infusion ( Intravenous New Bag/Given 09/29/22 0256)  sodium chloride 0.9 % bolus 500 mL (0 mLs Intravenous Stopped 09/28/22 2301)    Mobility walks with device - stand by assist s/t high fall risk d/t syncope fall      Focused Assessments Cardiac Assessment Handoff:  Cardiac Rhythm: Normal sinus rhythm Lab Results  Component Value Date   TROPONINI 1.90 (HH) 12/06/2014   Lab Results  Component Value Date   DDIMER 0.63 (H) 09/01/2020   Does the Patient currently have chest pain? No   , Pulmonary Assessment Handoff:  Lung sounds:          R Recommendations: See Admitting Provider Note  Report given to:    Additional Notes: -

## 2022-09-29 NOTE — Consult Note (Signed)
Gastroenterology Consult   Referring Provider: No ref. provider found Primary Care Physician:  Sherron Monday, MD Primary Gastroenterologist:  Dr. Manfred Shirts (last seen 2018 for colonoscopy)  Patient ID: Gregory [name]; [id number]; 1945/05/11   Admit date: 09/28/2022  LOS: 0 days   Date of Consultation: 09/29/2022  Reason for Consultation:  abdominal pain, abnormal CT    History of Present Illness   Gregory [name] is a 77 y.o. male with past medical history of CAD, hypertension, diastolic CHF, gout presented to the ED from home via EMS due to near syncopal episode.  On the evening of September 28, 2022, patient developed acute onset upper abdominal pain associated with nausea/vomiting.  No hematemesis.  No melena.  Recently started Linneus on August 30.  He was sitting in his recliner at onset of symptoms.  Daughter reports that the patient was shaking in his upper extremities and eyes rolled back into his head.  Patient is unsure whether he lost consciousness.  There was no bowel or bladder incontinence.  Patient did not bite his tongue.  EMS team did not feel like he was postictal.    Patient reported shaking episodes of the body, upper extremities worse, over a year. Can last from an hour to "all day". Has told his PCP but states no formal work up done.  In the ED: White blood cell count 10,100, hemoglobin 14.5, sodium was 133, blood glucose 101, BUN 27, creatinine 2.46 (baseline 1.7-2.0), troponin x 2 normal, urinalysis normal, lipase 39.  CT abdomen and pelvis without contrast showed bowel wall thickening of the proximal transverse colon likely peristalsis, underlying mass cannot be fully excluded.  Also noted to have stable in size indeterminant 0.9 cm left renal lesion requiring MRI renal protocol for further evaluation.  Noted to have epigastric tenderness on exam.  Lactic acid today was normal at 1.7.  Patient reports that he never had anything like this before. States  the pain was in the upper abdomen. He downplays the severity, stating it was mild, but states he doesn't want to experience anything like this again. At baseline, no abdominal pain. Takes TUMS for heartburn. No dysphagia. No melena, brbpr. No constipation, diarrhea. He has had worsening of his chronic low back pain.  He denies NSAIDS other than low dose ASA. No etoh use.  Completed MRI of lumbar spine today for back pain.  Patient's last colonoscopy June 2018, diverticulosis, single small hyperplastic polyp removed from the cecum, internal hemorrhoids noted.  No prior EGD.   Prior to Admission medications   Medication Sig Start Date End Date Taking? Authorizing Provider  acetaminophen (TYLENOL) 500 MG tablet Take 1,500 mg by mouth every 6 (six) hours as needed for moderate pain.   Yes [provider]  allopurinol (ZYLOPRIM) 100 MG tablet Take 1 tablet by mouth once daily 08/08/22  Yes Tejan-Sie, Marcelino Freestone, MD  aspirin 81 MG chewable tablet Chew 1 tablet (81 mg total) by mouth daily. 12/06/14  Yes Kilroy, Luke K, PA-C  Budeson-Glycopyrrol-Formoterol (BREZTRI AEROSPHERE) 160-9-4.8 MCG/ACT AERO INHALE 2 PUFFS TWICE DAILY 08/15/22  Yes Tejan-Sie, Marcelino Freestone, MD  cetirizine (ZYRTEC) 10 MG tablet TAKE 1 TABLET BY MOUTH ONCE DAILY IN THE MORNING 07/26/22  Yes Sherron Monday, MD  clopidogrel (PLAVIX) 75 MG tablet Take 1 tablet by mouth once daily 07/11/22  Yes Laurier Nancy, MD  Colchicine 0.6 MG CAPS USE AS DIRECTED. TAKE 2 CAPSULES FOR THE FIRST DOSE, THEN 1 CAPSULE AN  HOUR LATER, THEN 1 CAPSULE DAILY TILL PAIN FREE Patient taking differently: Take 1 capsule by mouth daily. Use as directed. Take 2 capsules by mouth for the first dose, then take 1 capsule an hour later, then 1 capsule daily until pain free. 08/08/22  Yes Sherron Monday, MD  empagliflozin (JARDIANCE) 25 MG TABS tablet Take 1 tablet (25 mg total) by mouth daily. 09/23/22  Yes Laurier Nancy, MD  fluticasone (FLONASE) 50 MCG/ACT  nasal spray Place 1 spray into both nostrils 2 (two) times daily. 11/18/14  Yes [provider]  furosemide (LASIX) 20 MG tablet Take 1 tablet by mouth once daily 08/18/22  Yes Adrian Blackwater A, MD  isosorbide mononitrate (IMDUR) 30 MG 24 hr tablet Take 1 tablet by mouth once daily 08/08/22  Yes Adrian Blackwater A, MD  lisinopril (ZESTRIL) 40 MG tablet Take 1 tablet by mouth once daily 08/22/22  Yes Tejan-Sie, Marcelino Freestone, MD  metoprolol tartrate (LOPRESSOR) 25 MG tablet TAKE 1/2 (ONE-HALF) TABLET BY MOUTH TWICE DAILY AS DIRECTED 08/08/22  Yes Sherron Monday, MD  rosuvastatin (CRESTOR) 40 MG tablet Take 1 tablet by mouth in the evening 08/05/22  Yes Tejan-Sie, Marcelino Freestone, MD  sodium bicarbonate 650 MG tablet Take 1 tablet (650 mg total) by mouth 2 (two) times daily. 07/11/22 07/11/23 Yes Sherron Monday, MD  spironolactone (ALDACTONE) 25 MG tablet Take 1 tablet by mouth once daily 08/12/22  Yes Laurier Nancy, MD  traZODone (DESYREL) 50 MG tablet TAKE 1 TABLET BY MOUTH AT BEDTIME AS NEEDED FOR SLEEP 09/14/22  Yes Sherron Monday, MD                   Current Facility-Administered Medications  Medication Dose Route Frequency Provider Last Rate Last Admin   0.9 %  sodium chloride infusion   Intravenous Continuous Tat, David, MD 75 mL/hr at 09/29/22 0928 1,000 mL at 09/29/22 0928   acetaminophen (TYLENOL) tablet 650 mg  650 mg Oral Q6H PRN Frankey Shown, DO       Or   acetaminophen (TYLENOL) suppository 650 mg  650 mg Rectal Q6H PRN Adefeso, Oladapo, DO       aspirin chewable tablet 81 mg  81 mg Oral Daily Adefeso, Oladapo, DO   81 mg at 09/29/22 4742   clopidogrel (PLAVIX) tablet 75 mg  75 mg Oral Daily Adefeso, Oladapo, DO   75 mg at 09/29/22 0927   enoxaparin (LOVENOX) injection 30 mg  30 mg Subcutaneous Q24H Adefeso, Oladapo, DO   30 mg at 09/29/22 0927   fentaNYL (SUBLIMAZE) injection 12.5 mcg  12.5 mcg Intravenous Q2H PRN Adefeso, Oladapo, DO       ipratropium-albuterol (DUONEB) 0.5-2.5  (3) MG/3ML nebulizer solution 3 mL  3 mL Nebulization Q8H Tat, David, MD       ondansetron (ZOFRAN) tablet 4 mg  4 mg Oral Q6H PRN Adefeso, Oladapo, DO       Or   ondansetron (ZOFRAN) injection 4 mg  4 mg Intravenous Q6H PRN Adefeso, Oladapo, DO       Oral care mouth rinse  15 mL Mouth Rinse PRN Tat, Onalee Hua, MD       pantoprazole (PROTONIX) injection 40 mg  40 mg Intravenous Q24H Adefeso, Oladapo, DO   40 mg at 09/29/22 0516   rosuvastatin (CRESTOR) tablet 40 mg  40 mg Oral QPM Adefeso, Oladapo, DO        Allergies as of 09/28/2022   (No Known Allergies)  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   Pneumothorax    Associated with pneumonia    Past Surgical History:  Procedure Laterality Date   Arm surgery     CARDIAC CATHETERIZATION N/A 12/05/2014   Procedure: Left Heart Cath and Coronary Angiography;  Surgeon: Lyn Records, MD;  Location: [facility] INVASIVE CV LAB;  Service: Cardiovascular;  Laterality: N/A;   CATARACT EXTRACTION W/PHACO Right 01/14/2013   Procedure: RIGHT EYE CATARACT EXTRACTION PHACO AND INTRAOCULAR LENS PLACEMENT ;  Surgeon: Gemma Payor, MD;  Location: [facility] ORS;  Service: Ophthalmology;  Laterality: Right;  CDE 54.97   CATARACT EXTRACTION W/PHACO Left 02/18/2013   Procedure: CATARACT EXTRACTION PHACO AND INTRAOCULAR LENS PLACEMENT (IOC);  Surgeon: Gemma Payor, MD;  Location: [facility] ORS;  Service: Ophthalmology;  Laterality: Left;  CDE 34.02   COLONOSCOPY WITH PROPOFOL N/A 07/13/2016   Procedure: COLONOSCOPY WITH PROPOFOL;  Surgeon: Scot Jun, MD;  Location: [facility] ENDOSCOPY;  Service: Endoscopy;  Laterality: N/A;   INGUINAL HERNIA REPAIR Right 09/09/2012   Procedure: HERNIA REPAIR INGUINAL INCARCERATED;  Surgeon: Kandis Cocking, MD;  Location: [facility] ORS;  Service: General;  Laterality: Right;   INSERTION OF MESH Right 09/09/2012   Procedure: INSERTION OF MESH;  Surgeon: Kandis Cocking, MD;  Location: [facility] ORS;  Service: General;  Laterality: Right;   LEFT HEART CATH AND CORONARY ANGIOGRAPHY Right 09/21/2021   Procedure: LEFT HEART CATH AND CORONARY ANGIOGRAPHY with intervention;  Surgeon: Laurier Nancy, MD;  Location: [facility] INVASIVE CV LAB;  Service: Cardiovascular;  Laterality: Right;    Family History  Problem Relation Age of Onset   COPD Father    Stroke Brother     Social History   Socioeconomic History   Marital status: Widowed    Spouse name: Not on file   Number of children: 2   Years of education: Not on file   Highest education level: Not on file  Occupational History   Not on file  Tobacco Use   Smoking status: Former    Current packs/day: 0.00    Types: Cigarettes    Quit date: 01/28/1978    Years since quitting: 44.6   Smokeless tobacco: Never  Vaping Use   Vaping status: Never Used  Substance and Sexual Activity   Alcohol use: No    Alcohol/week: 0.0 standard drinks of alcohol   Drug use: No   Sexual activity: Not on file  Other Topics Concern   Not on file  Social History Narrative   Wife passed approx. 4 years ago. Has 2 daughters. Lives by himself now.    Social Determinants of Health   Financial Resource Strain: Not on file  Food Insecurity: No Food Insecurity (09/29/2022)   Hunger Vital Sign    Worried About Running Out of Food in the Last Year: Never true    Ran Out of Food in the Last Year: Never true  Transportation Needs: No Transportation Needs (09/29/2022)   PRAPARE - Administrator, Civil Service (Medical): No    Lack of Transportation (Non-Medical): No  Physical Activity: Not on file  Stress: Not on file  Social Connections: Not on file  Intimate Partner Violence: Not At Risk (09/29/2022)   Humiliation, Afraid, Rape, and Kick questionnaire    Fear of Current or Ex-Partner: No    Emotionally Abused: No  Physically Abused: No    Sexually Abused: No     Review of System:   General: Negative for  anorexia, weight loss, fever, chills, fatigue, weakness. Eyes: Negative for vision changes.  ENT: Negative for hoarseness, difficulty swallowing, nasal congestion. CV: Negative for chest pain, angina, palpitations, dyspnea on exertion, peripheral edema.  Respiratory: Negative for dyspnea at rest, dyspnea on exertion, cough, sputum, wheezing.  GI: See history of present illness. GU:  Negative for dysuria, hematuria, urinary incontinence, urinary frequency, nocturnal urination.  MS: Negative for joint pain. See hpi.  Derm: Negative for rash or itching.  Neuro: Negative for weakness, abnormal sensation, seizure, frequent headaches, memory loss, confusion. See hpi Psych: Negative for anxiety, depression, suicidal ideation, hallucinations.  Endo: Negative for unusual weight change.  Heme: Negative for bruising or bleeding. Allergy: Negative for rash or hives.      Physical Examination:   Vital signs in last 24 hours: Temp:  [98 F (36.7 C)-98.4 F (36.9 C)] 98 F (36.7 C) (09/05 0857) Pulse Rate:  [53-69] 56 (09/05 0857) Resp:  [12-22] 16 (09/05 0406) BP: (80-108)/(57-69) 108/58 (09/05 0857) SpO2:  [90 %-100 %] 97 % (09/05 0857) Weight:  [86.3 kg] 86.3 kg (09/04 2208)    General: Well-nourished, well-developed in no acute distress.  Head: Normocephalic, atraumatic.   Eyes: Conjunctiva pink, no icterus. Mouth: Oropharyngeal mucosa moist and pink   Neck: Supple without thyromegaly, masses, or lymphadenopathy.  Lungs: Clear to auscultation bilaterally.  Heart: Regular rate and rhythm, no murmurs rubs or gallops.  Abdomen: Bowel sounds are normal, nontender, nondistended, no hepatosplenomegaly or masses, no abdominal bruits or hernia , no rebound or guarding.   Rectal: not performed Extremities: No lower extremity edema, clubbing, deformity.  Neuro: Alert and oriented x 4 , grossly normal neurologically.  Skin: Warm and dry, no rash or jaundice.   Psych: Alert and cooperative, normal  mood and affect.        Intake/Output from previous day: 09/04 0701 - 09/05 0700 In: 1914.4 [I.V.:116.4; IV Piggyback:1798] Out: -  Intake/Output this shift: Total I/O In: 150 [P.O.:150] Out: 550 [Urine:550]  Lab Results:   CBC Recent Labs    09/28/22 2229 09/29/22 0427  WBC 10.1 10.4  HGB 14.5 13.2  HCT 44.8 40.5  MCV 94.9 94.8  PLT 216 195   BMET Recent Labs    09/28/22 2229 09/29/22 0427  NA 133* 134*  K 4.3 4.0  CL 100 102  CO2 22 21*  GLUCOSE 101* 86  BUN 27* 26*  CREATININE 2.46* 2.13*  CALCIUM 9.0 8.8*   LFT Recent Labs    09/28/22 2229 09/29/22 0427  BILITOT 1.4* 1.5*  ALKPHOS 50 40  AST 37 32  ALT 32 27  PROT 7.0 5.8*  ALBUMIN 4.0 3.3*    Lipase Recent Labs    09/28/22 2229  LIPASE 39    PT/INR No results for input(s): "LABPROT", "INR" in the last 72 hours.   Hepatitis Panel No results for input(s): "HEPBSAG", "HCVAB", "HEPAIGM", "HEPBIGM" in the last 72 hours.   Imaging Studies:   CT ABDOMEN PELVIS WO CONTRAST  Result Date: 09/29/2022 CLINICAL DATA:  Abdominal pain, acute, nonlocalized Pt says had some abd pain, vomited, and then felt like he was going to pass out, EXAM: CT ABDOMEN AND PELVIS WITHOUT CONTRAST TECHNIQUE: Multidetector CT imaging of the abdomen and pelvis was performed following the standard protocol without IV contrast. RADIATION DOSE REDUCTION: This exam was performed according to the departmental dose-optimization  program which includes automated exposure control, adjustment of the mA and/or kV according to patient size and/or use of iterative reconstruction technique. COMPARISON:  CT renal 08/01/2020 FINDINGS: Lower chest: No acute abnormality.  Coronary artery calcification. Hepatobiliary: No focal liver abnormality. No gallstones, gallbladder wall thickening, or pericholecystic fluid. No biliary dilatation. Pancreas: No focal lesion. Normal pancreatic contour. No surrounding inflammatory changes. No main pancreatic  ductal dilatation. Spleen: Normal in size without focal abnormality. Adrenals/Urinary Tract: No adrenal nodule bilaterally. No nephrolithiasis and no hydronephrosis. There is a stable in size 0.9 cm left renal lesion with a density of 37 Hounsfield units. Bilateral fluid density lesions likely represent simple renal cysts. Simple renal cysts, in the absence of clinically indicated signs/symptoms, require no independent follow-up. No ureterolithiasis or hydroureter. The urinary bladder is unremarkable. Stomach/Bowel: Stomach is within normal limits. No evidence of small bowel wall thickening or dilatation. Bowel thickening of the proximal transverse colon likely peristalsis of the proximal transverse colon (5:19). No large bowel dilatation. Colonic diverticulosis. Appendix appears normal. Vascular/Lymphatic: No abdominal aorta or iliac aneurysm. Severe atherosclerotic plaque of the aorta and its branches. No abdominal, pelvic, or inguinal lymphadenopathy. Reproductive: Prostate is unremarkable. Other: No intraperitoneal free fluid. No intraperitoneal free gas. No organized fluid collection. Musculoskeletal: No abdominal wall hernia or abnormality. No suspicious lytic or blastic osseous lesions. No acute displaced fracture. Multilevel degenerative changes of the spine. IMPRESSION: 1. Bowel wall thickening of the proximal transverse colon likely peristalsis. Underlying mass cannot be fully excluded. If clinically indicated, consider follow-up with repeat CT with intravenous contrast versus colonoscopy. 2. Stable in size indeterminate 0.9 cm left renal lesion. When the patient is clinically stable and able to follow directions and hold their breath (preferably as an outpatient) further evaluation with dedicated MRI renal protocol should be considered. 3.  Aortic Atherosclerosis (ICD10-I70.0)-severe. Electronically Signed   By: Tish Frederickson M.D.   On: 09/29/2022 00:58   DG Chest Port 1 View  Result Date:  09/28/2022 CLINICAL DATA:  Upper abdominal pain. Syncopal episode. Vomiting. Possible seizure. EXAM: PORTABLE CHEST 1 VIEW COMPARISON:  08/06/2022 FINDINGS: Heart size and pulmonary vascularity are normal. Probable atelectasis in the right lung base. Left lung is clear. No pleural effusions. No pneumothorax. Mediastinal contours appear intact. IMPRESSION: Probable atelectasis in the right base.  No focal consolidation. Electronically Signed   By: Burman Nieves M.D.   On: 09/28/2022 23:25  [4 week]  Assessment:   Pleasant 77 year old male with past medical history of CAD, hypertension, diastolic CHF, gout presented to the ED from home via EMS due to near syncopal episode, epigastric pain, nausea/vomiting.  GI consulted for abdominal pain and abnormal CT.  Acute onset nausea/vomiting, epigastric pain which has now resolved.  Etiology unclear.  Question acute mechanical process.  CT with possible bowel wall thickening of the proximal transverse colon versus peristalsis without underlying mass per radiology.  Last colonoscopy in 2018.  Indeterminant left renal lesion, recommending outpatient MRI renal protocol.  Recommendations per attending.   Plan:   Consider outpatient colonoscopy to evaluate abnormal transverse colon CT finding.   LOS: 0 days   We would like to thank you for the opportunity to participate in the care of Gregory [name].  Leanna Battles. Dixon Boos [facility] [phone] 9/5/202410:07 AM

## 2022-09-29 NOTE — Evaluation (Signed)
Physical Therapy Evaluation Patient Details Name: Gregory [name] MRN: [id number] DOB: 29-Jun-1945 Today's Date: 09/29/2022  History of Present Illness  Gregory [name] is a 77 y.o. male with medical history significant of hypertension, CAD, gout who presents to the emergency department from home via EMS due to near syncopal episode.  Patient complained of upper abdominal pain and vomiting which started suddenly and caused him to slump over.  He was suspected to have had seizure due to daughter noticing that eyes rolled back in his head and was noted to be shaking elevated.  Patient was not sure if he lost consciousness.  EMS was activated, and on arrival of EMS team, it was determined that patient had no postictal.  Abdominal pain, though still ongoing has improved.  Pain was described as sharp and was in epigastric area without any radiation.  No known alleviating/aggravating factor.  He complained of 1 month of low back pain with radiation to right knee.    Clinical Impression  Patient lying in bed on therapist arrival.  Agreeable to therapist assessment.  He reports right leg pain ongoing for a month at an 8/10.  PT discusses with him following up with PCP and possibly seeing an orthopedic MD regarding right leg pain after hospital discharge.  Patient is able to perform supine to sit with HOB slightly elevated with modified independence taking extra time to perform secondary to right leg pain.  He sits on the edge of the bed with good sitting balance.  Patient needs CGA/min A for balance and safety with sit to stand due to pain right leg.  He is able to walk with 1 hand held assist x 8 feet in the room with CGA/min A; noted decreased stance right leg and antalgic gait;   reports increasing right leg pain so we return to having him lie in bed.  Patient is able to sit and return to supine modified independently. Patient left in bed with call button in reach and bed alarm set.    Patient will benefit from  continued skilled therapy services during the remainder of his hospital stay and at the next recommended venue of care to address deficits and promote return to optimal function.          If plan is discharge home, recommend the following: A little help with walking and/or transfers;A little help with bathing/dressing/bathroom;Help with stairs or ramp for entrance;Assistance with cooking/housework   Can travel by private vehicle        Equipment Recommendations None recommended by PT  Recommendations for Other Services       Functional Status Assessment Patient has had a recent decline in their functional status and demonstrates the ability to make significant improvements in function in a reasonable and predictable amount of time.     Precautions / Restrictions Precautions Precautions: Fall Restrictions Weight Bearing Restrictions: No      Mobility  Bed Mobility Overal bed mobility: Modified Independent             General bed mobility comments: takes extra time due to pain right leg Patient Response: Cooperative  Transfers Overall transfer level: Needs assistance Equipment used: 1 person hand held assist Transfers: Sit to/from Stand Sit to Stand: Contact guard assist, Min assist           General transfer comment: sit to stand with CGA/min A for balance; patient tends to lean off right leg over to the left    Ambulation/Gait Ambulation/Gait assistance: Min  assist, Contact guard assist Gait Distance (Feet): 8 Feet Assistive device: 1 person hand held assist Gait Pattern/deviations: Decreased stance time - right, Antalgic       General Gait Details: decreased gait speed  Stairs            Wheelchair Mobility     Tilt Bed Tilt Bed Patient Response: Cooperative  Modified Rankin (Stroke Patients Only)       Balance Overall balance assessment: Needs assistance Sitting-balance support: Feet supported Sitting balance-Leahy Scale:  Good Sitting balance - Comments: good sitting balance on the edge of the bed     Standing balance-Leahy Scale: Fair Standing balance comment: fair standing balance with 1 person hand held assist; pain right leg causes him to not fully bear weight on the right so impairs his balance                             Pertinent Vitals/Pain Pain Assessment Pain Assessment: 0-10 Pain Score: 8  Pain Location: right leg Pain Intervention(s): Limited activity within patient's tolerance, Monitored during session    Home Living Family/patient expects to be discharged to:: Private residence Living Arrangements: Alone Available Help at Discharge: Family;Friend(s);Available PRN/intermittently Type of Home: Mobile home Home Access: Stairs to enter Entrance Stairs-Rails: Left;Right;Can reach both Entrance Stairs-Number of Steps: 5   Home Layout: One level Home Equipment: Agricultural consultant (2 wheels);Rollator (4 wheels);Cane - single point;Grab bars - tub/shower      Prior Function Prior Level of Function : Needs assist       Physical Assist : ADLs (physical)       ADLs Comments: neighbor assists with cooking and cleaning     Extremity/Trunk Assessment   Upper Extremity Assessment Upper Extremity Assessment: Generalized weakness    Lower Extremity Assessment Lower Extremity Assessment: Generalized weakness;RLE deficits/detail RLE Deficits / Details: right leg pain    Cervical / Trunk Assessment Cervical / Trunk Assessment: Normal  Communication   Communication Communication: No apparent difficulties  Cognition Arousal: Alert Behavior During Therapy: WFL for tasks assessed/performed Overall Cognitive Status: Within Functional Limits for tasks assessed                                          General Comments      Exercises     Assessment/Plan    PT Assessment Patient needs continued PT services  PT Problem List Decreased strength;Decreased  activity tolerance;Decreased balance;Decreased mobility;Pain       PT Treatment Interventions Gait training;Stair training;Functional mobility training;Therapeutic activities;Therapeutic exercise;Balance training;Patient/family education    PT Goals (Current goals can be found in the Care Plan section)  Acute Rehab PT Goals Patient Stated Goal: return home PT Goal Formulation: With patient Time For Goal Achievement: 10/13/22 Potential to Achieve Goals: Good    Frequency Min 2X/week     Co-evaluation               AM-PAC PT "6 Clicks" Mobility  Outcome Measure Help needed turning from your back to your side while in a flat bed without using bedrails?: None Help needed moving from lying on your back to sitting on the side of a flat bed without using bedrails?: None Help needed moving to and from a bed to a chair (including a wheelchair)?: A Little Help needed standing up from a chair using your arms (  e.g., wheelchair or bedside chair)?: A Little Help needed to walk in hospital room?: A Little Help needed climbing 3-5 steps with a railing? : A Lot 6 Click Score: 19    End of Session   Activity Tolerance: Patient tolerated treatment well;Patient limited by pain Patient left: in bed;with call bell/phone within reach;with bed alarm set Nurse Communication: Mobility status PT Visit Diagnosis: Unsteadiness on feet (R26.81);Difficulty in walking, not elsewhere classified (R26.2);Muscle weakness (generalized) (M62.81);Other abnormalities of gait and mobility (R26.89)    Time: 1000-1020 PT Time Calculation (min) (ACUTE ONLY): 20 min   Charges:   PT Evaluation $PT Eval Low Complexity: 1 Low   PT General Charges $$ ACUTE PT VISIT: 1 Visit         10:47 AM, 09/29/22 Shondell Fabel Small Doneshia Hill MPT [facility] [place] [phone] [phone]

## 2022-09-29 NOTE — ED Notes (Signed)
Patient transported to CT 

## 2022-09-29 NOTE — Progress Notes (Signed)
PROGRESS NOTE  Gregory [name]:[id number] DOB: September 18, 1945 DOA: 09/28/2022 PCP: Sherron Monday, MD  Brief History:  77 year old male with history of hypertension, coronary artery disease, diastolic CHF presenting with a near syncopal episode.  On the evening of 09/28/2022, the patient developed acute onset of upper abdominal pain with associated nausea and vomiting.  He is unable to clarify him any times he threw up.  There is no hematemesis.  He denied any previous abdominal pain or vomiting prior to the episode described above.  He denies any new medications except Jardiance which was started on 09/23/2022.  He states that he was sitting on the recliner at the onset of the above symptoms.  He is not sure whether he completely lost consciousness.  There was concern that the patient had some shaking of his upper extremities and his eyes were rolled into the back of his head.  There was no bowel or bladder incontinence.  The patient did not bite his tongue.  He stated that his hands were still shaking at the time he arrived in the emergency department.  At that time, the patient was awake and conversant. Upon further questioning, the patient states that he has had the shaking episodes of his upper extremities for over a year.  They can last anywhere from 1 hour to "all day".  He states that his shaking episodes are usually more prevalent when he is stressed out.  There is no loss consciousness. He states that he still had abdominal pain up until this morning.  He has had no further vomiting.  He denies any diarrhea, hematochezia, melena.  He denies any chest pain or shortness of breath.  There is no coughing or hemoptysis.  He has not had any fevers or chills, headache, dizziness. In the ED, the patient was afebrile, but did have low BP down to 80/65.  Oxygen saturation was 100% on room air.  WBC 10.1, hemoglobin 14.5, platelets 216,000.  Sodium 133, potassium 4.3, bicarbonate 22, serum  creatinine 2.46.  Troponin 4>> 5.  AST 37, ALT 32, alk phosphatase 50, T. bili 1.4, lipase 39.  CT of the abdomen and pelvis showed bowel wall thickening with proximal transverse colon.  Pancreas was normal.  There is no hydronephrosis.  The patient was admitted for further evaluation and treatment.   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 -Baseline creatinine 1.6-2.0 -Presented with serum creatinine 2.46 -Secondary to volume depletion -Judicious IV fluids -Continue bicarbonate  Essential hypertension -Holding metoprolol and lisinopril secondary to soft blood pressure -Holding spironolactone secondary soft blood pressure  Chronic diastolic CHF -Holding spironolactone, and Jardiance -Holding furosemide temporarily  Tobacco abuse in remission -Patient 60-pack-year history of tobacco -Bronchodilators as needed -Continue Breztri  Coronary artery disease -No chest pain presently -Continue Imdur -Continue aspirin and Plavix  Mixed hyperlipidemia -Continue Crestor  Low back pain -question radicular symptoms -MR lumbar spine  Tremor -pt give hx of hand tremors >1 year     Family Communication:   Family at bedside  Consultants:  GI  Code Status:  FULL   DVT Prophylaxis:   [place] Lovenox   Procedures: As Listed in Progress Note Above  Antibiotics: None      Total time spent 50 minutes.  Greater than 50% spent face to face counseling and coordinating  care.   Subjective:  Patient denies fevers, chills, headache, chest pain, dyspnea, nausea, vomiting, diarrhea, abdominal pain, dysuria, hematuria, hematochezia, and melena.  Objective: Vitals:   09/29/22 0245 09/29/22 0300 09/29/22 0323 09/29/22 0406  BP:  (!) 104/57 (!) 94/59 100/61  Pulse: 64 61 (!) 53 65  Resp: 18 19 14 16   Temp:   98.4 F (36.9 C) 98.2 F (36.8 C)  TempSrc:   Oral Oral  SpO2: 92% 94% 93% 100%  Weight:      Height:        Intake/Output Summary (Last 24 hours) at 09/29/2022 0831 Last data filed at 09/29/2022 0430 Gross per 24 hour  Intake 1914.36 ml  Output --  Net 1914.36 ml   Weight change:  Exam:  General:  Pt is alert, follows commands appropriately, not in acute distress HEENT: No icterus, No thrush, No neck mass, [place]/AT Cardiovascular: RRR, S1/S2, no rubs, no gallops Respiratory: CTA bilaterally, no wheezing, no crackles, no rhonchi Abdomen: Soft/+BS, non tender, non distended, no guarding Extremities: No edema, No lymphangitis, No petechiae, No rashes, no synovitis   Data Reviewed: I have personally reviewed following labs and imaging studies Basic Metabolic Panel: Recent Labs  Lab 09/28/22 2229 09/29/22 0427  NA 133* 134*  K 4.3 4.0  CL 100 102  CO2 22 21*  GLUCOSE 101* 86  BUN 27* 26*  CREATININE 2.46* 2.13*  CALCIUM 9.0 8.8*  MG  --  2.2  PHOS  --  3.2   Liver Function Tests: Recent Labs  Lab 09/28/22 2229 09/29/22 0427  AST 37 32  ALT 32 27  ALKPHOS 50 40  BILITOT 1.4* 1.5*  PROT 7.0 5.8*  ALBUMIN 4.0 3.3*   Recent Labs  Lab 09/28/22 2229  LIPASE 39   No results for input(s): "AMMONIA" in the last 168 hours. Coagulation Profile: No results for input(s): "INR", "PROTIME" in the last 168 hours. CBC: Recent Labs  Lab 09/28/22 2229 09/29/22 0427  WBC 10.1 10.4  HGB 14.5 13.2  HCT 44.8 40.5  MCV 94.9 94.8  PLT 216 195   Cardiac Enzymes: No results for input(s): "CKTOTAL", "CKMB", "CKMBINDEX", "TROPONINI" in  the last 168 hours. BNP: Invalid input(s): "POCBNP" CBG: Recent Labs  Lab 09/28/22 2204  GLUCAP 108*   HbA1C: No results for input(s): "HGBA1C" in the last 72 hours. Urine analysis:    Component Value Date/Time   COLORURINE YELLOW 09/29/2022 0139   APPEARANCEUR CLEAR 09/29/2022 0139   LABSPEC 1.009 09/29/2022 0139   PHURINE 6.0 09/29/2022 0139   GLUCOSEU >=500 (A) 09/29/2022 0139   HGBUR NEGATIVE 09/29/2022 0139   BILIRUBINUR NEGATIVE 09/29/2022 0139   BILIRUBINUR Negative 07/11/2022 1341   KETONESUR NEGATIVE 09/29/2022 0139   PROTEINUR NEGATIVE 09/29/2022 0139   UROBILINOGEN 0.2 07/11/2022 1341   UROBILINOGEN 0.2 09/08/2012 1833   NITRITE NEGATIVE 09/29/2022 0139   LEUKOCYTESUR NEGATIVE 09/29/2022 0139   Sepsis Labs: @LABRCNTIP (procalcitonin:4,lacticidven:4) )No results found for this or any previous visit (from the past 240 hour(s)).   Scheduled Meds:  aspirin  81 mg Oral Daily   clopidogrel  75 mg Oral Daily   enoxaparin (LOVENOX) injection  30 mg Subcutaneous Q24H   pantoprazole (PROTONIX) IV  40 mg Intravenous Q24H   rosuvastatin  40 mg Oral QPM   Continuous Infusions:  lactated ringers 125 mL/hr at 09/29/22 0430    Procedures/Studies: CT ABDOMEN PELVIS WO CONTRAST  Result Date: 09/29/2022 CLINICAL DATA:  Abdominal pain, acute, nonlocalized Pt says had some abd pain, vomited, and then felt like he was going to pass out, EXAM: CT ABDOMEN AND PELVIS WITHOUT CONTRAST TECHNIQUE: Multidetector CT imaging of the abdomen and pelvis was performed following the standard protocol without IV contrast. RADIATION DOSE REDUCTION: This exam was performed according to the departmental dose-optimization program which includes automated exposure control, adjustment of the mA and/or kV according to patient size and/or use of iterative reconstruction technique. COMPARISON:  CT renal 08/01/2020 FINDINGS: Lower chest: No acute abnormality.  Coronary artery calcification. Hepatobiliary:  No focal liver abnormality. No gallstones, gallbladder wall thickening, or pericholecystic fluid. No biliary dilatation. Pancreas: No focal lesion. Normal pancreatic contour. No surrounding inflammatory changes. No main pancreatic ductal dilatation. Spleen: Normal in size without focal abnormality. Adrenals/Urinary Tract: No adrenal nodule bilaterally. No nephrolithiasis and no hydronephrosis. There is a stable in size 0.9 cm left renal lesion with a density of 37 Hounsfield units. Bilateral fluid density lesions likely represent simple renal cysts. Simple renal cysts, in the absence of clinically indicated signs/symptoms, require no independent follow-up. No ureterolithiasis or hydroureter. The urinary bladder is unremarkable. Stomach/Bowel: Stomach is within normal limits. No evidence of small bowel wall thickening or dilatation. Bowel thickening of the proximal transverse colon likely peristalsis of the proximal transverse colon (5:19). No large bowel dilatation. Colonic diverticulosis. Appendix appears normal. Vascular/Lymphatic: No abdominal aorta or iliac aneurysm. Severe atherosclerotic plaque of the aorta and its branches. No abdominal, pelvic, or inguinal lymphadenopathy. Reproductive: Prostate is unremarkable. Other: No intraperitoneal free fluid. No intraperitoneal free gas. No organized fluid collection. Musculoskeletal: No abdominal wall hernia or abnormality. No suspicious lytic or blastic osseous lesions. No acute displaced fracture. Multilevel degenerative changes of the spine. IMPRESSION: 1. Bowel wall thickening of the proximal transverse colon likely peristalsis. Underlying mass cannot be fully excluded. If clinically indicated, consider follow-up with repeat CT with intravenous contrast versus colonoscopy. 2. Stable in size indeterminate 0.9 cm left renal lesion. When the patient is clinically stable and able to follow directions and hold their breath (preferably as an outpatient) further  evaluation with dedicated MRI renal protocol should be considered. 3.  Aortic Atherosclerosis (ICD10-I70.0)-severe. Electronically Signed   By: Tish Frederickson M.D.   On: 09/29/2022 00:58   DG Chest Port 1 View  Result Date: 09/28/2022 CLINICAL DATA:  Upper abdominal pain. Syncopal episode. Vomiting. Possible seizure. EXAM: PORTABLE CHEST 1 VIEW COMPARISON:  08/06/2022 FINDINGS: Heart size and pulmonary vascularity are normal. Probable atelectasis in the right lung base. Left lung is clear. No pleural effusions. No pneumothorax. Mediastinal contours appear intact. IMPRESSION: Probable atelectasis in the right base.  No focal consolidation. Electronically Signed   By: Burman Nieves M.D.   On: 09/28/2022 23:25    Catarina Hartshorn, DO  [facility]  If 7PM-7AM, please contact night-coverage [email or web address] Password [email or web address] 09/29/2022, 8:31 AM   LOS: 0 days

## 2022-09-29 NOTE — Progress Notes (Signed)
EEG complete - results pending 

## 2022-09-29 NOTE — H&P (Signed)
History and Physical    Patient: Gregory [name] HKV:[id number] DOB: 1945/06/15 DOA: 09/28/2022 DOS: the patient was seen and examined on 09/29/2022 PCP: Sherron Monday, MD  Patient coming from: Home  Chief Complaint:  Chief Complaint  Patient presents with   Loss of Consciousness   HPI: Gregory [name] is a 77 y.o. male with medical history significant of hypertension, CAD, gout who presents to the emergency department from home via EMS due to near syncopal episode.  Patient complained of upper abdominal pain and vomiting which started suddenly and caused him to slump over.  He was suspected to have had seizure due to daughter noticing that eyes rolled back in his head and was noted to be shaking elevated.  Patient was not sure if he lost consciousness.  EMS was activated, and on arrival of EMS team, it was determined that patient had no postictal.  Abdominal pain, though still ongoing has improved.  Pain was described as sharp and was in epigastric area without any radiation.  No known alleviating/aggravating factor.  He complained of 1 month of low back pain with radiation to right knee.  ED Course:  In the emergency department, respiratory rate was 21/min, but other vital signs were within normal range.  Workup in the ED showed normal CBC and BMP except for sodium of 133, blood glucose 101, BUN/creatinine 27/2.46 (baseline creatinine at 1.7-2.0).  Troponin x 2 was normal, urinalysis was normal, lipase 39.   CT abdomen and pelvis without contrast showed  bowel wall thickening of the proximal transverse colon likely peristalsis.  IV hydration was provided.  Hospitalist was asked to admit patient for further evaluation and management.  Review of Systems: Review of systems as noted in the HPI. All other systems reviewed and are negative.   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    Pneumothorax    Associated with pneumonia   Past Surgical History:  Procedure Laterality Date   Arm surgery     CARDIAC CATHETERIZATION N/A 12/05/2014   Procedure: Left Heart Cath and Coronary Angiography;  Surgeon: Lyn Records, MD;  Location: [facility] INVASIVE CV LAB;  Service: Cardiovascular;  Laterality: N/A;   CATARACT EXTRACTION W/PHACO Right 01/14/2013   Procedure: RIGHT EYE CATARACT EXTRACTION PHACO AND INTRAOCULAR LENS PLACEMENT ;  Surgeon: Gemma Payor, MD;  Location: [facility] ORS;  Service: Ophthalmology;  Laterality: Right;  CDE 54.97   CATARACT EXTRACTION W/PHACO Left 02/18/2013   Procedure: CATARACT EXTRACTION PHACO AND INTRAOCULAR LENS PLACEMENT (IOC);  Surgeon: Gemma Payor, MD;  Location: [facility] ORS;  Service: Ophthalmology;  Laterality: Left;  CDE 34.02   COLONOSCOPY WITH PROPOFOL N/A 07/13/2016   Procedure: COLONOSCOPY WITH PROPOFOL;  Surgeon: Scot Jun, MD;  Location: [facility] ENDOSCOPY;  Service: Endoscopy;  Laterality: N/A;   INGUINAL HERNIA REPAIR Right 09/09/2012   Procedure: HERNIA REPAIR INGUINAL INCARCERATED;  Surgeon: Kandis Cocking, MD;  Location: [facility] ORS;  Service: General;  Laterality: Right;   INSERTION OF MESH Right 09/09/2012   Procedure: INSERTION OF MESH;  Surgeon: Kandis Cocking, MD;  Location: [facility] ORS;  Service: General;  Laterality: Right;   LEFT HEART CATH AND CORONARY ANGIOGRAPHY Right 09/21/2021   Procedure: LEFT HEART CATH AND CORONARY ANGIOGRAPHY with intervention;  Surgeon: Laurier Nancy, MD;  Location: [facility] INVASIVE CV LAB;  Service: Cardiovascular;  Laterality: Right;    Social  History:  reports that he quit smoking about 44 years ago. His smoking use included cigarettes. He has never used smokeless tobacco. He reports that he does not drink alcohol and does not use drugs.   No Known Allergies  Family History  Problem Relation Age of Onset   COPD Father    Stroke Brother      Prior to Admission medications   Medication Sig Start Date End Date Taking?  Authorizing Provider  acetaminophen (TYLENOL) 500 MG tablet Take 1,500 mg by mouth every 6 (six) hours as needed for moderate pain.    [provider]  allopurinol (ZYLOPRIM) 100 MG tablet Take 1 tablet by mouth once daily 08/08/22   Sherron Monday, MD  aspirin 81 MG chewable tablet Chew 1 tablet (81 mg total) by mouth daily. 12/06/14   Kilroy, Luke K, PA-C  BELSOMRA 10 MG TABS Take 1 tablet by mouth at bedtime. 01/31/22   [provider]  Budeson-Glycopyrrol-Formoterol (BREZTRI AEROSPHERE) 160-9-4.8 MCG/ACT AERO INHALE 2 PUFFS TWICE DAILY 08/15/22   Sherron Monday, MD  celecoxib (CELEBREX) 200 MG capsule Take 200-400 mg by mouth daily as needed. 06/29/20   [provider]  cetirizine (ZYRTEC) 10 MG tablet TAKE 1 TABLET BY MOUTH ONCE DAILY IN THE MORNING 07/26/22   Sherron Monday, MD  clopidogrel (PLAVIX) 75 MG tablet Take 1 tablet by mouth once daily 07/11/22   Laurier Nancy, MD  Colchicine 0.6 MG CAPS USE AS DIRECTED. TAKE 2 CAPSULES FOR THE FIRST DOSE, THEN 1 CAPSULE AN HOUR LATER, THEN 1 CAPSULE DAILY TILL PAIN FREE 08/08/22   Sherron Monday, MD  COMBIVENT RESPIMAT 20-100 MCG/ACT AERS respimat Inhale 1 puff into the lungs every 6 (six) hours as needed for shortness of breath. 01/28/21   [provider]  empagliflozin (JARDIANCE) 25 MG TABS tablet Take 1 tablet (25 mg total) by mouth daily. 09/23/22   Laurier Nancy, MD  fluticasone (FLONASE) 50 MCG/ACT nasal spray Place 1 spray into both nostrils 2 (two) times daily. 11/18/14   [provider]  furosemide (LASIX) 20 MG tablet Take 1 tablet by mouth once daily 08/18/22   Laurier Nancy, MD  HYDROcodone-acetaminophen (NORCO) 5-325 MG tablet Take 1-2 tablets by mouth every 6 (six) hours as needed. 08/06/22   Geoffery Lyons, MD  isosorbide mononitrate (IMDUR) 30 MG 24 hr tablet Take 1 tablet by mouth once daily 08/08/22   Adrian Blackwater A, MD  lisinopril (ZESTRIL) 40 MG tablet Take 1 tablet by mouth once  daily 08/22/22   Sherron Monday, MD  metoprolol tartrate (LOPRESSOR) 25 MG tablet TAKE 1/2 (ONE-HALF) TABLET BY MOUTH TWICE DAILY AS DIRECTED 08/08/22   Sherron Monday, MD  rosuvastatin (CRESTOR) 40 MG tablet Take 1 tablet by mouth in the evening 08/05/22   Sherron Monday, MD  sodium bicarbonate 650 MG tablet Take 1 tablet (650 mg total) by mouth 2 (two) times daily. 07/11/22 07/11/23  Sherron Monday, MD  spironolactone (ALDACTONE) 25 MG tablet Take 1 tablet by mouth once daily 08/12/22   Laurier Nancy, MD  traZODone (DESYREL) 50 MG tablet TAKE 1 TABLET BY MOUTH AT BEDTIME AS NEEDED FOR SLEEP 09/14/22   Sherron Monday, MD    Physical Exam: BP 100/61 (BP Location: Right Arm)   Pulse 65   Temp 98.2 F (36.8 C) (Oral)   Resp 16   Ht 5\' 4"  (1.626 m)   Wt 86.3 kg   SpO2  100%   BMI 32.66 kg/m   General: 77 y.o. year-old male well developed well nourished in no acute distress.  Alert and oriented x3. HEENT: NCAT, EOMI Neck: Supple, trachea medial, dry mucous membrane Cardiovascular: Regular rate and rhythm with no rubs or gallops.  No thyromegaly or JVD noted.  No lower extremity edema. 2/4 pulses in all 4 extremities. Respiratory: Clear to auscultation with no wheezes or rales. Good inspiratory effort. Abdomen: Soft, tender to palpation in the epigastric area without guarding.  Normal bowel sounds x4 quadrants. Muskuloskeletal: No cyanosis, clubbing or edema noted bilaterally Neuro: CN II-XII intact, strength 5/5 x 4, sensation, reflexes intact Skin: No ulcerative lesions noted or rashes Psychiatry: Judgement and insight appear normal. Mood is appropriate for condition and setting          Labs on Admission:  Basic Metabolic Panel: Recent Labs  Lab 09/28/22 2229  NA 133*  K 4.3  CL 100  CO2 22  GLUCOSE 101*  BUN 27*  CREATININE 2.46*  CALCIUM 9.0   Liver Function Tests: Recent Labs  Lab 09/28/22 2229  AST 37  ALT 32  ALKPHOS 50  BILITOT 1.4*  PROT 7.0   ALBUMIN 4.0   Recent Labs  Lab 09/28/22 2229  LIPASE 39   No results for input(s): "AMMONIA" in the last 168 hours. CBC: Recent Labs  Lab 09/28/22 2229  WBC 10.1  HGB 14.5  HCT 44.8  MCV 94.9  PLT 216   Cardiac Enzymes: No results for input(s): "CKTOTAL", "CKMB", "CKMBINDEX", "TROPONINI" in the last 168 hours.  BNP (last 3 results) No results for input(s): "BNP" in the last 8760 hours.  ProBNP (last 3 results) No results for input(s): "PROBNP" in the last 8760 hours.  CBG: Recent Labs  Lab 09/28/22 2204  GLUCAP 108*    Radiological Exams on Admission: CT ABDOMEN PELVIS WO CONTRAST  Result Date: 09/29/2022 CLINICAL DATA:  Abdominal pain, acute, nonlocalized Pt says had some abd pain, vomited, and then felt like he was going to pass out, EXAM: CT ABDOMEN AND PELVIS WITHOUT CONTRAST TECHNIQUE: Multidetector CT imaging of the abdomen and pelvis was performed following the standard protocol without IV contrast. RADIATION DOSE REDUCTION: This exam was performed according to the departmental dose-optimization program which includes automated exposure control, adjustment of the mA and/or kV according to patient size and/or use of iterative reconstruction technique. COMPARISON:  CT renal 08/01/2020 FINDINGS: Lower chest: No acute abnormality.  Coronary artery calcification. Hepatobiliary: No focal liver abnormality. No gallstones, gallbladder wall thickening, or pericholecystic fluid. No biliary dilatation. Pancreas: No focal lesion. Normal pancreatic contour. No surrounding inflammatory changes. No main pancreatic ductal dilatation. Spleen: Normal in size without focal abnormality. Adrenals/Urinary Tract: No adrenal nodule bilaterally. No nephrolithiasis and no hydronephrosis. There is a stable in size 0.9 cm left renal lesion with a density of 37 Hounsfield units. Bilateral fluid density lesions likely represent simple renal cysts. Simple renal cysts, in the absence of clinically  indicated signs/symptoms, require no independent follow-up. No ureterolithiasis or hydroureter. The urinary bladder is unremarkable. Stomach/Bowel: Stomach is within normal limits. No evidence of small bowel wall thickening or dilatation. Bowel thickening of the proximal transverse colon likely peristalsis of the proximal transverse colon (5:19). No large bowel dilatation. Colonic diverticulosis. Appendix appears normal. Vascular/Lymphatic: No abdominal aorta or iliac aneurysm. Severe atherosclerotic plaque of the aorta and its branches. No abdominal, pelvic, or inguinal lymphadenopathy. Reproductive: Prostate is unremarkable. Other: No intraperitoneal free fluid. No intraperitoneal free gas. No  organized fluid collection. Musculoskeletal: No abdominal wall hernia or abnormality. No suspicious lytic or blastic osseous lesions. No acute displaced fracture. Multilevel degenerative changes of the spine. IMPRESSION: 1. Bowel wall thickening of the proximal transverse colon likely peristalsis. Underlying mass cannot be fully excluded. If clinically indicated, consider follow-up with repeat CT with intravenous contrast versus colonoscopy. 2. Stable in size indeterminate 0.9 cm left renal lesion. When the patient is clinically stable and able to follow directions and hold their breath (preferably as an outpatient) further evaluation with dedicated MRI renal protocol should be considered. 3.  Aortic Atherosclerosis (ICD10-I70.0)-severe. Electronically Signed   By: Tish Frederickson M.D.   On: 09/29/2022 00:58   DG Chest Port 1 View  Result Date: 09/28/2022 CLINICAL DATA:  Upper abdominal pain. Syncopal episode. Vomiting. Possible seizure. EXAM: PORTABLE CHEST 1 VIEW COMPARISON:  08/06/2022 FINDINGS: Heart size and pulmonary vascularity are normal. Probable atelectasis in the right lung base. Left lung is clear. No pleural effusions. No pneumothorax. Mediastinal contours appear intact. IMPRESSION: Probable atelectasis in  the right base.  No focal consolidation. Electronically Signed   By: Burman Nieves M.D.   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  Carotid artery Dopplers will be done to rule out hemodynamically significant stenosis Consider cardiology consult based on echo findings  Abdominal pain, nausea and vomiting Abdominal pain have since improved Continue IV fentanyl 12.5 mcg every 2 hours as needed with holding parameters Continue IV Protonix 40 mg daily Continue IV hydration  Continue IV Zofran p.r.n. Continue clear liquid diet with plan to advance diet as tolerated Obtain blood culture x2 Surgery  will be consulted to follow up with patient in the morning  Questionable seizures Patient was suspected to have had a seizure episode EMS reported no postictal state EEG will be done in the morning Consider teleneurology consult based on EEG findings  Acute kidney injury superimposed on CKD Dehydration BUN/creatinine 27/2.46 (baseline creatinine at 1.7-2.0) Continue IV hydration Renally adjust medications, avoid nephrotoxic agents/dehydration/hypotension  Essential hypertension BP  meds will be held at this time due to soft BP  History of coronary artery disease Continue aspirin, Plavix, statin Metoprolol, Imdur will be held at this time due to soft BP  Obesity (BMI 32.66) Diet and lifestyle modification   DVT prophylaxis: Lovenox  Advance Care Planning: CODE STATUS: Full code  Consults: None  Family Communication: None at bedside  Severity of Illness: The appropriate patient status for this patient is OBSERVATION. Observation status is judged to be reasonable and necessary in order to provide the required intensity of service to ensure the patient's safety. The patient's presenting symptoms, physical exam findings, and  initial radiographic and laboratory data in the context of their medical condition is felt to place them at decreased risk for further clinical deterioration. Furthermore, it is anticipated that the patient will be medically stable for discharge from the hospital within 2 midnights of admission.   Author: Frankey Shown, DO 09/29/2022 4:13 AM  For on call review [email or web address].

## 2022-09-30 ENCOUNTER — Other Ambulatory Visit (HOSPITAL_COMMUNITY): Payer: 59

## 2022-09-30 DIAGNOSIS — E669 Obesity, unspecified: Secondary | ICD-10-CM | POA: Diagnosis not present

## 2022-09-30 DIAGNOSIS — N179 Acute kidney failure, unspecified: Secondary | ICD-10-CM | POA: Diagnosis not present

## 2022-09-30 DIAGNOSIS — I10 Essential (primary) hypertension: Secondary | ICD-10-CM | POA: Diagnosis not present

## 2022-09-30 DIAGNOSIS — N1832 Chronic kidney disease, stage 3b: Secondary | ICD-10-CM

## 2022-09-30 DIAGNOSIS — R55 Syncope and collapse: Secondary | ICD-10-CM | POA: Diagnosis not present

## 2022-09-30 LAB — CBC
HCT: 42 % (ref 39.0–52.0)
Hemoglobin: 13.3 g/dL (ref 13.0–17.0)
MCH: 30.4 pg (ref 26.0–34.0)
MCHC: 31.7 g/dL (ref 30.0–36.0)
MCV: 96.1 fL (ref 80.0–100.0)
Platelets: 186 10*3/uL (ref 150–400)
RBC: 4.37 MIL/uL (ref 4.22–5.81)
RDW: 14.1 % (ref 11.5–15.5)
WBC: 8 10*3/uL (ref 4.0–10.5)
nRBC: 0 % (ref 0.0–0.2)

## 2022-09-30 LAB — BASIC METABOLIC PANEL
Anion gap: 8 (ref 5–15)
BUN: 18 mg/dL (ref 8–23)
CO2: 21 mmol/L — ABNORMAL LOW (ref 22–32)
Calcium: 8.6 mg/dL — ABNORMAL LOW (ref 8.9–10.3)
Chloride: 107 mmol/L (ref 98–111)
Creatinine, Ser: 1.74 mg/dL — ABNORMAL HIGH (ref 0.61–1.24)
GFR, Estimated: 41 mL/min — ABNORMAL LOW (ref >60)
Glucose, Bld: 85 mg/dL (ref 70–99)
Potassium: 4.1 mmol/L (ref 3.5–5.1)
Sodium: 136 mmol/L (ref 135–145)

## 2022-09-30 LAB — MAGNESIUM: Magnesium: 2.3 mg/dL (ref 1.7–2.4)

## 2022-09-30 MED ORDER — PREDNISONE 50 MG PO TABS: 50.0000 mg | ORAL_TABLET | Freq: Every day | ORAL | 0 refills | Status: DC

## 2022-09-30 MED ORDER — CYCLOBENZAPRINE HCL 5 MG PO TABS: 5.0000 mg | ORAL_TABLET | Freq: Three times a day (TID) | ORAL | 0 refills | Status: DC | PRN

## 2022-09-30 MED ORDER — PREDNISONE 20 MG PO TABS: 50.0000 mg | ORAL_TABLET | Freq: Every day | ORAL | Status: DC

## 2022-09-30 MED ORDER — CYCLOBENZAPRINE HCL 10 MG PO TABS: 5.0000 mg | ORAL_TABLET | Freq: Three times a day (TID) | ORAL | Status: DC | PRN

## 2022-09-30 NOTE — Discharge Summary (Signed)
Physician Discharge Summary   Patient: Gregory [name] MRN: [id number] DOB: December 20, 1945  Admit date:     09/28/2022  Discharge date: 09/30/22  Discharge Physician: Onalee Hua Natlie Asfour   PCP: Sherron Monday, MD   Recommendations at discharge:   Please follow up with primary care provider within 1-2 weeks  Please repeat BMP and CBC in one week     Hospital Course: 77 year old male with history of hypertension, coronary artery disease, diastolic CHF presenting with a near syncopal episode.  On the evening of 09/28/2022, the patient developed acute onset of upper abdominal pain with associated nausea and vomiting.  He is unable to clarify him any times he threw up.  There is no hematemesis.  He denied any previous abdominal pain or vomiting prior to the episode described above.  He denies any new medications except Jardiance which was started on 09/23/2022.  He states that he was sitting on the recliner at the onset of the above symptoms.  He is not sure whether he completely lost consciousness.  There was concern that the patient had some shaking of his upper extremities and his eyes were rolled into the back of his head.  There was no bowel or bladder incontinence.  The patient did not bite his tongue.  He stated that his hands were still shaking at the time he arrived in the emergency department.  At that time, the patient was awake and conversant. Upon further questioning, the patient states that he has had the shaking episodes of his upper extremities for over a year.  They can last anywhere from 1 hour to "all day".  He states that his shaking episodes are usually more prevalent when he is stressed out.  There is no loss consciousness. He states that he still had abdominal pain up until this morning.  He has had no further vomiting.  He denies any diarrhea, hematochezia, melena.  He denies any chest pain or shortness of breath.  There is no coughing or hemoptysis.  He has not had any fevers or chills,  headache, dizziness. In the ED, the patient was afebrile, but did have low BP down to 80/65.  Oxygen saturation was 100% on room air.  WBC 10.1, hemoglobin 14.5, platelets 216,000.  Sodium 133, potassium 4.3, bicarbonate 22, serum creatinine 2.46.  Troponin 4>> 5.  AST 37, ALT 32, alk phosphatase 50, T. bili 1.4, lipase 39.  CT of the abdomen and pelvis showed bowel wall thickening with proximal transverse colon.  Pancreas was normal.  There is no hydronephrosis.  The patient was admitted for further evaluation and treatment.  Assessment and Plan: Syncope/near syncope -Suspect this is vagal reaction as well as a component of volume depletion and hypotension -The patient did have soft blood pressures and hypotension in the emergency department with SBP 80/65 -Continue IV fluids -Check orthostatics--neg -Echocardiogram--pt did not want to wait for the echo due to his daughter's death while he was in the hospital and needing to be discharged -EEG--neg for seizure   Abdominal pain with vomiting -09/29/2022 CT abdomen--bowel wall thickening of the proximal transverse colon -GI consult appreciated>>plan for outpatient colonoscopy -Clear liquid diet initially>>advanced and tolerated -Lipase 39 -UA 0-5 WBC -continue PPI -abd pain resolved at time of dc   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 -Baseline creatinine 1.6-2.0 -Presented with serum creatinine 2.46 -Secondary to volume depletion -Judicious IV fluids given -Continue bicarbonate -serum creatinine 1.74 on day of d/c   Essential hypertension -Holding metoprolol and lisinopril  secondary to soft blood pressure -Holding spironolactone secondary soft blood pressure>>restart after d/c -will not restart lisinopril>>BP remains well controlled with SBP 100-110s   Chronic diastolic CHF -Holding spironolactone, and Jardiance>>restart after d/c -Holding furosemide temporarily>>restart after dc -will not restart lisinopril 40 mg   Tobacco  abuse in remission -Patient 60-pack-year history of tobacco -Bronchodilators as needed -Continue Breztri   Coronary artery disease -No chest pain presently -Continue Imdur -Continue aspirin and Plavix   Mixed hyperlipidemia -Continue Crestor   Low back pain -right leg radicular symptoms -MR lumbar spine--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 Curlene Labrum short course prednisone and muscle relaxer and follow up in office in 2-4 weeks   Tremor -pt give hx of hand tremors >1 year         Consultants: none Procedures performed: none  Disposition: Home Diet recommendation:  Cardiac diet DISCHARGE MEDICATION: Allergies as of 09/30/2022   No Known Allergies      Medication List     STOP taking these medications    Combivent Respimat 20-100 MCG/ACT Aers respimat Generic drug: Ipratropium-Albuterol   dapagliflozin propanediol 5 MG Tabs tablet Commonly known as: FARXIGA   lisinopril 40 MG tablet Commonly known as: ZESTRIL       TAKE these medications    acetaminophen 500 MG tablet Commonly known as: TYLENOL Take 1,500 mg by mouth every 6 (six) hours as needed for moderate pain.   allopurinol 100 MG tablet Commonly known as: ZYLOPRIM Take 1 tablet by mouth once daily   aspirin 81 MG chewable tablet Chew 1 tablet (81 mg total) by mouth daily.   Breztri Aerosphere 160-9-4.8 MCG/ACT Aero Generic drug: Budeson-Glycopyrrol-Formoterol INHALE 2 PUFFS TWICE DAILY   cetirizine 10 MG tablet Commonly known as: ZYRTEC TAKE 1 TABLET BY MOUTH ONCE DAILY IN THE MORNING   clopidogrel 75 MG tablet Commonly known as: PLAVIX Take 1 tablet by mouth once daily   Colchicine 0.6 MG Caps USE AS DIRECTED. TAKE 2 CAPSULES FOR THE FIRST DOSE, THEN 1 CAPSULE AN HOUR LATER, THEN 1 CAPSULE DAILY TILL PAIN FREE What changed: See the new instructions.   cyclobenzaprine 5 MG  tablet Commonly known as: FLEXERIL Take 1 tablet (5 mg total) by mouth 3 (three) times daily as needed for muscle spasms (back spasm).   empagliflozin 25 MG Tabs tablet Commonly known as: Jardiance Take 1 tablet (25 mg total) by mouth daily.   fluticasone 50 MCG/ACT nasal spray Commonly known as: FLONASE Place 1 spray into both nostrils 2 (two) times daily.   furosemide 20 MG tablet Commonly known as: LASIX Take 1 tablet by mouth once daily   isosorbide mononitrate 30 MG 24 hr tablet Commonly known as: IMDUR Take 1 tablet by mouth once daily   metoprolol tartrate 25 MG tablet Commonly known as: LOPRESSOR TAKE 1/2 (ONE-HALF) TABLET BY MOUTH TWICE DAILY AS DIRECTED   predniSONE 50 MG tablet Commonly known as: DELTASONE Take 1 tablet (50 mg total) by mouth daily with breakfast. Start taking on: October 01, 2022   rosuvastatin 40 MG tablet Commonly known as: CRESTOR Take 1 tablet by mouth in the evening   sodium bicarbonate 650 MG tablet Take 1 tablet (650 mg total) by mouth 2 (two) times daily.   spironolactone 25 MG tablet Commonly known as: ALDACTONE Take 1 tablet by mouth once daily   traZODone 50 MG tablet Commonly known as: DESYREL TAKE 1 TABLET BY MOUTH AT BEDTIME  AS NEEDED FOR SLEEP        Follow-up Information     Lisbeth Renshaw, MD. Schedule an appointment as soon as possible for a visit in 2 week(s).   Specialty: Neurosurgery Contact information: [address] [place] [address] [phone]                Discharge Exam: Ceasar Mons Weights   09/28/22 2208  Weight: 86.3 kg   HEENT:  [place]/AT, No thrush, no icterus CV:  RRR, no rub, no S3, no S4 Lung:  CTA, no wheeze, no rhonchi Abd:  soft/+BS, NT Ext:  No edema, no lymphangitis, no synovitis, no rash   Condition at discharge: stable  The results of significant diagnostics from this hospitalization (including imaging, microbiology, ancillary and laboratory) are  listed below for reference.   Imaging Studies: EEG adult  Result Date: 10-29-22 Charlsie Quest, MD     29-Oct-2022  3:53 PM Patient Name: KAYN GMEREK MRN: [id number] Epilepsy Attending: Charlsie Quest Referring Physician/Provider: Catarina Hartshorn, MD Date: October 29, 2022 Duration: 22.09 mins Patient history: 77 y.o. male with medical history significant of hypertension, CAD, gout who presents to the emergency department from home via EMS due to near syncopal episode.  EEG to evaluate for seizure. Level of alertness: Awake, asleep AEDs during EEG study: None Technical aspects: This EEG study was done with scalp electrodes positioned according to the 10-20 International system of electrode placement. Electrical activity was reviewed with band pass filter of 1-70Hz , sensitivity of 7 uV/mm, display speed of 30mm/sec with a 60Hz  notched filter applied as appropriate. EEG data were recorded continuously and digitally stored.  Video monitoring was available and reviewed as appropriate. Description: The posterior dominant rhythm consists of 8-9 Hz activity of moderate voltage (25-35 uV) seen predominantly in posterior head regions, symmetric and reactive to eye opening and eye closing. Sleep was characterized by vertex waves, sleep spindles (12 to 14 Hz), maximal frontocentral region.  Hyperventilation and photic stimulation were not performed.   IMPRESSION: This study is within normal limits. No seizures or epileptiform discharges were seen throughout the recording. Charlsie Quest   MR LUMBAR SPINE WO CONTRAST  Result Date: 10-29-22 CLINICAL DATA:  Low back pain, spondyloarthropathy suspected. Right leg pain. EXAM: MRI LUMBAR SPINE WITHOUT CONTRAST TECHNIQUE: Multiplanar, multisequence MR imaging of the lumbar spine was performed. No intravenous contrast was administered. COMPARISON:  Abdominopelvic CT October 29, 2022. CT lumbar spine 08/01/2020. FINDINGS: Segmentation: Conventional anatomy assumed, with the last open  disc space designated L5-S1.Concordant with prior imaging. Alignment: Minimal convex left scoliosis. No focal angulation or significant listhesis. Vertebrae: Chronic bilateral L5 pars defects without associated marrow edema. No evidence of acute fracture or aggressive osseous lesion. The lumbar pedicles are diffusely short on a congenital basis. The visualized sacroiliac joints appear unremarkable. Conus medullaris: Extends to the T12-L1 level and appears normal. Paraspinal and other soft tissues: No significant paraspinal findings. The urinary bladder is moderately distended. Disc levels: Sagittal images demonstrate no significant disc space findings within the visualized lower thoracic spine. Mild disc bulging with bilateral facet hypertrophy at T12-L1. No resulting significant spinal stenosis or nerve root encroachment. Mild loss of disc height with annular disc bulging and endplate osteophytes asymmetric to the right. Mild to moderate facet hypertrophy. Stable mild spinal stenosis with mild lateral recess and foraminal narrowing on the right. L2-3: Mild loss of disc height with annular disc bulging and mild to moderate facet hypertrophy. Mild spinal stenosis with mild asymmetric  narrowing of the right lateral recess. Both foramina appear sufficiently patent. L3-4: Mild loss of disc height with annular disc bulging and endplate osteophytes. Moderate facet and ligamentous hypertrophy. Resulting severe multifactorial spinal stenosis with moderate to severe lateral recess narrowing bilaterally. Mild foraminal narrowing bilaterally. L4-5: Chronic loss of disc height with annular disc bulging and endplate osteophytes asymmetric to the left. Moderate facet and ligamentous hypertrophy. Resulting moderate multifactorial spinal stenosis with moderate lateral recess narrowing bilaterally. There is mild right and severe left foraminal narrowing with probable left L4 nerve root encroachment. L5-S1: Relatively preserved disc  height with mild bilateral facet hypertrophy. No significant spinal stenosis or foraminal narrowing. IMPRESSION: 1. 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 Findings may be slightly progressive from previous CT. 2. 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 3. Moderate multifactorial spinal stenosis at L4-5 with moderate lateral recess narrowing bilaterally. Severe left foraminal narrowing with probable left L4 nerve root encroachment. 4. Mild multifactorial spinal stenosis at L2-3. 5. Chronic bilateral L5 pars defects without significant listhesis or foraminal narrowing at L5-S1. Electronically Signed   By: Carey Bullocks M.D.   On: 09/29/2022 14:12   CT ABDOMEN PELVIS WO CONTRAST  Result Date: 09/29/2022 CLINICAL DATA:  Abdominal pain, acute, nonlocalized Pt says had some abd pain, vomited, and then felt like he was going to pass out, EXAM: CT ABDOMEN AND PELVIS WITHOUT CONTRAST TECHNIQUE: Multidetector CT imaging of the abdomen and pelvis was performed following the standard protocol without IV contrast. RADIATION DOSE REDUCTION: This exam was performed according to the departmental dose-optimization program which includes automated exposure control, adjustment of the mA and/or kV according to patient size and/or use of iterative reconstruction technique. COMPARISON:  CT renal 08/01/2020 FINDINGS: Lower chest: No acute abnormality.  Coronary artery calcification. Hepatobiliary: No focal liver abnormality. No gallstones, gallbladder wall thickening, or pericholecystic fluid. No biliary dilatation. Pancreas: No focal lesion. Normal pancreatic contour. No surrounding inflammatory changes. No main pancreatic ductal dilatation. Spleen: Normal in size without focal abnormality. Adrenals/Urinary Tract: No adrenal nodule bilaterally. No nephrolithiasis and no hydronephrosis. There is a stable in size 0.9 cm left renal  lesion with a density of 37 Hounsfield units. Bilateral fluid density lesions likely represent simple renal cysts. Simple renal cysts, in the absence of clinically indicated signs/symptoms, require no independent follow-up. No ureterolithiasis or hydroureter. The urinary bladder is unremarkable. Stomach/Bowel: Stomach is within normal limits. No evidence of small bowel wall thickening or dilatation. Bowel thickening of the proximal transverse colon likely peristalsis of the proximal transverse colon (5:19). No large bowel dilatation. Colonic diverticulosis. Appendix appears normal. Vascular/Lymphatic: No abdominal aorta or iliac aneurysm. Severe atherosclerotic plaque of the aorta and its branches. No abdominal, pelvic, or inguinal lymphadenopathy. Reproductive: Prostate is unremarkable. Other: No intraperitoneal free fluid. No intraperitoneal free gas. No organized fluid collection. Musculoskeletal: No abdominal wall hernia or abnormality. No suspicious lytic or blastic osseous lesions. No acute displaced fracture. Multilevel degenerative changes of the spine. IMPRESSION: 1. Bowel wall thickening of the proximal transverse colon likely peristalsis. Underlying mass cannot be fully excluded. If clinically indicated, consider follow-up with repeat CT with intravenous contrast versus colonoscopy. 2. Stable in size indeterminate 0.9 cm left renal lesion. When the patient is clinically stable and able to follow directions and hold their breath (preferably as an outpatient) further evaluation with dedicated MRI renal protocol should be considered. 3.  Aortic Atherosclerosis (ICD10-I70.0)-severe. Electronically Signed   By: Blanchie Serve  Tessie Fass M.D.   On: 09/29/2022 00:58   DG Chest Port 1 View  Result Date: 09/28/2022 CLINICAL DATA:  Upper abdominal pain. Syncopal episode. Vomiting. Possible seizure. EXAM: PORTABLE CHEST 1 VIEW COMPARISON:  08/06/2022 FINDINGS: Heart size and pulmonary vascularity are normal. Probable  atelectasis in the right lung base. Left lung is clear. No pleural effusions. No pneumothorax. Mediastinal contours appear intact. IMPRESSION: Probable atelectasis in the right base.  No focal consolidation. Electronically Signed   By: Burman Nieves M.D.   On: 09/28/2022 23:25    Microbiology: Results for orders placed or performed during the hospital encounter of 08/31/20  Resp Panel by RT-PCR (Flu A&B, Covid) Nasopharyngeal Swab     Status: None   Collection Time: 08/31/20  4:23 PM   Specimen: Nasopharyngeal Swab; Nasopharyngeal(NP) swabs in vial transport medium  Result Value Ref Range Status   SARS Coronavirus 2 by RT PCR NEGATIVE NEGATIVE Final    Comment: (NOTE) SARS-CoV-2 target nucleic acids are NOT DETECTED.  The SARS-CoV-2 RNA is generally detectable in upper respiratory specimens during the acute phase of infection. The lowest concentration of SARS-CoV-2 viral copies this assay can detect is 138 copies/mL. A negative result does not preclude SARS-Cov-2 infection and should not be used as the sole basis for treatment or other patient management decisions. A negative result may occur with  improper specimen collection/handling, submission of specimen other than nasopharyngeal swab, presence of viral mutation(s) within the areas targeted by this assay, and inadequate number of viral copies(<138 copies/mL). A negative result must be combined with clinical observations, patient history, and epidemiological information. The expected result is Negative.  Fact Sheet for Patients:  BloggerCourse.com  Fact Sheet for Healthcare Providers:  SeriousBroker.it  This test is no t yet approved or cleared by the [place] FDA and  has been authorized for detection and/or diagnosis of SARS-CoV-2 by FDA under an Emergency Use Authorization (EUA). This EUA will remain  in effect (meaning this test can be used) for the duration of  the COVID-19 declaration under Section 564(b)(1) of the Act, 21 U.S.C.section 360bbb-3(b)(1), unless the authorization is terminated  or revoked sooner.       Influenza A by PCR NEGATIVE NEGATIVE Final   Influenza B by PCR NEGATIVE NEGATIVE Final    Comment: (NOTE) The Xpert Xpress SARS-CoV-2/FLU/RSV plus assay is intended as an aid in the diagnosis of influenza from Nasopharyngeal swab specimens and should not be used as a sole basis for treatment. Nasal washings and aspirates are unacceptable for Xpert Xpress SARS-CoV-2/FLU/RSV testing.  Fact Sheet for Patients: BloggerCourse.com  Fact Sheet for Healthcare Providers: SeriousBroker.it  This test is not yet approved or cleared by the [place] FDA and has been authorized for detection and/or diagnosis of SARS-CoV-2 by FDA under an Emergency Use Authorization (EUA). This EUA will remain in effect (meaning this test can be used) for the duration of the COVID-19 declaration under Section 564(b)(1) of the Act, 21 U.S.C. section 360bbb-3(b)(1), unless the authorization is terminated or revoked.  Performed at [facility], [address], [place], [place] [address]     Labs: CBC: Recent Labs  Lab 09/28/22 2229 09/29/22 0427 09/30/22 0403  WBC 10.1 10.4 8.0  HGB 14.5 13.2 13.3  HCT 44.8 40.5 42.0  MCV 94.9 94.8 96.1  PLT 216 195 186   Basic Metabolic Panel: Recent Labs  Lab 09/28/22 2229 09/29/22 0427 09/29/22 0914 09/30/22 0403  NA 133* 134*  --  136  K 4.3 4.0  --  4.1  CL 100 102  --  107  CO2 22 21*  --  21*  GLUCOSE 101* 86  --  85  BUN 27* 26*  --  18  CREATININE 2.46* 2.13*  --  1.74*  CALCIUM 9.0 8.8*  --  8.6*  MG  --  2.2 2.3 2.3  PHOS  --  3.2  --   --    Liver Function Tests: Recent Labs  Lab 09/28/22 2229 09/29/22 0427  AST 37 32  ALT 32 27  ALKPHOS 50 40  BILITOT 1.4* 1.5*  PROT 7.0 5.8*  ALBUMIN 4.0 3.3*   CBG: Recent Labs  Lab  09/28/22 2204  GLUCAP 108*    Discharge time spent: greater than 30 minutes.  Signed: Catarina Hartshorn, MD [facility] 09/30/2022

## 2022-10-06 ENCOUNTER — Other Ambulatory Visit: Payer: Self-pay | Admitting: Cardiovascular Disease

## 2022-10-13 DIAGNOSIS — R809 Proteinuria, unspecified: Secondary | ICD-10-CM | POA: Diagnosis not present

## 2022-10-13 DIAGNOSIS — I129 Hypertensive chronic kidney disease with stage 1 through stage 4 chronic kidney disease, or unspecified chronic kidney disease: Secondary | ICD-10-CM | POA: Diagnosis not present

## 2022-10-13 DIAGNOSIS — N1832 Chronic kidney disease, stage 3b: Secondary | ICD-10-CM | POA: Diagnosis not present

## 2022-10-13 DIAGNOSIS — N281 Cyst of kidney, acquired: Secondary | ICD-10-CM | POA: Diagnosis not present

## 2022-10-13 DIAGNOSIS — E875 Hyperkalemia: Secondary | ICD-10-CM | POA: Diagnosis not present

## 2022-10-16 ENCOUNTER — Other Ambulatory Visit: Payer: Self-pay | Admitting: Internal Medicine

## 2022-10-16 DIAGNOSIS — N184 Chronic kidney disease, stage 4 (severe): Secondary | ICD-10-CM

## 2022-10-18 ENCOUNTER — Encounter (INDEPENDENT_AMBULATORY_CARE_PROVIDER_SITE_OTHER): Payer: 59 | Admitting: Gastroenterology

## 2022-10-24 ENCOUNTER — Ambulatory Visit: Payer: 59

## 2022-10-24 ENCOUNTER — Other Ambulatory Visit: Payer: Self-pay | Admitting: Internal Medicine

## 2022-10-27 ENCOUNTER — Other Ambulatory Visit: Payer: Self-pay

## 2022-10-27 ENCOUNTER — Encounter: Payer: Self-pay | Admitting: Cardiovascular Disease

## 2022-10-27 ENCOUNTER — Ambulatory Visit: Payer: 59 | Admitting: Cardiovascular Disease

## 2022-10-27 VITALS — BP 110/70 | HR 86 | Ht 64.0 in | Wt 183.4 lb

## 2022-10-27 DIAGNOSIS — I340 Nonrheumatic mitral (valve) insufficiency: Secondary | ICD-10-CM | POA: Diagnosis not present

## 2022-10-27 DIAGNOSIS — I5033 Acute on chronic diastolic (congestive) heart failure: Secondary | ICD-10-CM

## 2022-10-27 DIAGNOSIS — E875 Hyperkalemia: Secondary | ICD-10-CM

## 2022-10-27 DIAGNOSIS — N184 Chronic kidney disease, stage 4 (severe): Secondary | ICD-10-CM

## 2022-10-27 DIAGNOSIS — I2510 Atherosclerotic heart disease of native coronary artery without angina pectoris: Secondary | ICD-10-CM | POA: Diagnosis not present

## 2022-10-27 DIAGNOSIS — I10 Essential (primary) hypertension: Secondary | ICD-10-CM | POA: Diagnosis not present

## 2022-10-27 NOTE — Progress Notes (Signed)
?     Cardiology Office Note   Date:  10/27/2022   ID:  Gregory [name], DOB [birth year], MRN [id number]  PCP:  Sherron Monday, MD  Cardiologist:  Adrian Blackwater, MD      History of Present Illness: Gregory [name] is a 77 y.o. 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   Pneumothorax    Associated with pneumonia     Past Surgical History:  Procedure Laterality Date   Arm surgery     CARDIAC CATHETERIZATION N/A 12/05/2014   Procedure: Left Heart Cath and Coronary Angiography;  Surgeon: Lyn Records, MD;  Location: [facility] INVASIVE CV LAB;  Service: Cardiovascular;  Laterality: N/A;   CATARACT EXTRACTION W/PHACO Right 01/14/2013   Procedure: RIGHT EYE CATARACT EXTRACTION PHACO AND INTRAOCULAR LENS PLACEMENT ;  Surgeon: Gemma Payor, MD;  Location: [facility];  Service: Ophthalmology;  Laterality: Right;  CDE 54.97   CATARACT EXTRACTION W/PHACO Left 02/18/2013   Procedure: CATARACT EXTRACTION PHACO AND INTRAOCULAR LENS PLACEMENT (IOC);  Surgeon: Gemma Payor, MD;  Location: [facility];  Service: Ophthalmology;  Laterality: Left;  CDE 34.02   COLONOSCOPY WITH PROPOFOL N/A 07/13/2016   Procedure: COLONOSCOPY WITH PROPOFOL;  Surgeon: Scot Jun, MD;  Location: [facility] ENDOSCOPY;  Service: Endoscopy;  Laterality: N/A;   INGUINAL HERNIA REPAIR Right 09/09/2012   Procedure: HERNIA REPAIR INGUINAL INCARCERATED;  Surgeon: Kandis Cocking, MD;  Location: [facility];  Service: General;  Laterality: Right;   INSERTION OF MESH Right 09/09/2012   Procedure: INSERTION OF MESH;  Surgeon: Kandis Cocking, MD;  Location: [facility];  Service: General;  Laterality: Right;   LEFT HEART CATH AND CORONARY ANGIOGRAPHY Right 09/21/2021   Procedure: LEFT HEART CATH AND CORONARY ANGIOGRAPHY with intervention;  Surgeon:  Laurier Nancy, MD;  Location: [facility] INVASIVE CV LAB;  Service: Cardiovascular;  Laterality: Right;     Current Outpatient Medications  Medication Sig Dispense Refill   acetaminophen (TYLENOL) 500 MG tablet Take 1,500 mg by mouth every 6 (six) hours as needed for moderate pain.     allopurinol (ZYLOPRIM) 100 MG tablet Take 1 tablet by mouth once daily 90 tablet 0   aspirin 81 MG chewable tablet Chew 1 tablet (81 mg total) by mouth daily.     Budeson-Glycopyrrol-Formoterol (BREZTRI AEROSPHERE) 160-9-4.8 MCG/ACT AERO INHALE 2 PUFFS TWICE DAILY 11 g 2   cetirizine (ZYRTEC) 10 MG tablet TAKE 1 TABLET BY MOUTH ONCE DAILY IN THE MORNING 90 tablet 0   clopidogrel (PLAVIX) 75 MG tablet Take 1 tablet by mouth once daily 90 tablet 0   Colchicine 0.6 MG CAPS USE AS DIRECTED. TAKE 2 CAPSULES FOR THE FIRST DOSE, THEN 1 CAPSULE AN HOUR LATER, THEN 1 CAPSULE DAILY TILL PAIN FREE (Patient taking differently: Take 1 capsule by mouth daily. Use as directed. Take 2 capsules by mouth for the first dose, then take 1 capsule an hour later, then 1 capsule daily until pain free.) 30 capsule 2   cyclobenzaprine (FLEXERIL) 5 MG tablet Take 1 tablet (5 mg total) by mouth 3 (three) times daily as needed for muscle spasms (back spasm). 21 tablet 0   fluticasone (FLONASE) 50 MCG/ACT nasal spray Place 1  spray into both nostrils 2 (two) times daily.     furosemide (LASIX) 20 MG tablet Take 1 tablet by mouth once daily 90 tablet 0   isosorbide mononitrate (IMDUR) 30 MG 24 hr tablet Take 1 tablet by mouth once daily 90 tablet 0   metoprolol tartrate (LOPRESSOR) 25 MG tablet TAKE 1/2 (ONE-HALF) TABLET BY MOUTH TWICE DAILY AS DIRECTED 90 tablet 0   predniSONE (DELTASONE) 50 MG tablet Take 1 tablet (50 mg total) by mouth daily with breakfast. 5 tablet 0   rosuvastatin (CRESTOR) 40 MG tablet Take 1 tablet by mouth in the evening 90 tablet 0   sodium bicarbonate 650 MG tablet Take 1 tablet by mouth twice daily 100 tablet 0    spironolactone (ALDACTONE) 25 MG tablet Take 1 tablet by mouth once daily 30 tablet 0   traZODone (DESYREL) 50 MG tablet TAKE 1 TABLET BY MOUTH AT BEDTIME AS NEEDED FOR SLEEP 30 tablet 1   No current facility-administered medications for this visit.    Allergies:   Patient has no known allergies.    Social History:   reports that he quit smoking about 44 years ago. His smoking use included cigarettes. He has never used smokeless tobacco. He reports that he does not drink alcohol and does not use drugs.   Family History:  family history includes COPD in his father; Stroke in his brother.    ROS:     ROS    All other systems are reviewed and negative.    PHYSICAL EXAM: VS:  BP 110/70   Pulse 86   Ht 5\' 4"  (1.626 m)   Wt 183 lb 6.4 oz (83.2 kg)   SpO2 97%   BMI 31.48 kg/m  , BMI Body mass index is 31.48 kg/m. Last weight:  Wt Readings from Last 3 Encounters:  10/27/22 183 lb 6.4 oz (83.2 kg)  09/28/22 190 lb 4.1 oz (86.3 kg)  09/23/22 190 lb 3.2 oz (86.3 kg)     Physical Exam    EKG:   Recent Labs: 09/29/2022: ALT 27 09/30/2022: BUN 18; Creatinine, Ser 1.74; Hemoglobin 13.3; Magnesium 2.3; Platelets 186; Potassium 4.1; Sodium 136    Lipid Panel    Component Value Date/Time   CHOL 102 07/08/2022 1023   TRIG 183 (H) 07/08/2022 1023   HDL 37 (L) 07/08/2022 1023   CHOLHDL 3.9 12/05/2014 0709   VLDL 21 12/05/2014 0709   LDLCALC 35 07/08/2022 1023      Other studies Reviewed: Additional studies/ records that were reviewed today include:  Review of the above records demonstrates:       No data to display            ASSESSMENT AND PLAN:    ICD-10-CM   1. Essential hypertension  I10    BP 80/60, so do not take lisinopril, Farxiga, lasix, continue aldactone 2 times a week as recomended. For some reason does not require all the meds.    2. CAD, multiple vessel  I25.10     3. Nonrheumatic mitral valve regurgitation  I34.0     4. CHF (congestive heart  failure), NYHA class III, acute on chronic, diastolic (HCC)  I50.33    Aldactone 25  2 times a week, stop lisinopril and jaurdiance due to CRI and low  blood pressure    5. CKD (chronic kidney disease) stage 4, GFR 15-29 ml/min (HCC)  N18.4    Creat got worse with jardiance and lisinopril and was stopped.  Problem List Items Addressed This Visit       Cardiovascular and Mediastinum   CAD, multiple vessel   Essential hypertension - Primary   Nonrheumatic mitral valve regurgitation     Genitourinary   CKD (chronic kidney disease) stage 4, GFR 15-29 ml/min (HCC)   Other Visit Diagnoses     CHF (congestive heart failure), NYHA class III, acute on chronic, diastolic (HCC)       Aldactone 25  2 times a week, stop lisinopril and jaurdiance due to CRI and low  blood pressure          Disposition:   Return in about 4 weeks (around 11/24/2022).    Total time spent: 30 minutes  Signed,  Adrian Blackwater, MD  10/27/2022 3:02 PM    [facility]

## 2022-10-28 ENCOUNTER — Ambulatory Visit (INDEPENDENT_AMBULATORY_CARE_PROVIDER_SITE_OTHER): Payer: 59 | Admitting: Internal Medicine

## 2022-10-28 ENCOUNTER — Encounter: Payer: Self-pay | Admitting: Internal Medicine

## 2022-10-28 VITALS — BP 132/78 | HR 84 | Ht 64.0 in | Wt 182.3 lb

## 2022-10-28 DIAGNOSIS — E785 Hyperlipidemia, unspecified: Secondary | ICD-10-CM

## 2022-10-28 DIAGNOSIS — G4700 Insomnia, unspecified: Secondary | ICD-10-CM | POA: Insufficient documentation

## 2022-10-28 DIAGNOSIS — J301 Allergic rhinitis due to pollen: Secondary | ICD-10-CM | POA: Insufficient documentation

## 2022-10-28 DIAGNOSIS — M47816 Spondylosis without myelopathy or radiculopathy, lumbar region: Secondary | ICD-10-CM | POA: Diagnosis not present

## 2022-10-28 DIAGNOSIS — I10 Essential (primary) hypertension: Secondary | ICD-10-CM | POA: Diagnosis not present

## 2022-10-28 LAB — POTASSIUM: Potassium: 4.2 mmol/L (ref 3.5–5.2)

## 2022-10-28 MED ORDER — METOPROLOL TARTRATE 25 MG PO TABS: 12.5000 mg | ORAL_TABLET | Freq: Two times a day (BID) | ORAL | 0 refills | Status: DC

## 2022-10-28 MED ORDER — AZELASTINE HCL 137 MCG/SPRAY NA SOLN
1.0000 | Freq: Every day | NASAL | 2 refills | Status: DC
Start: 2022-10-28 — End: 2022-12-27

## 2022-10-28 MED ORDER — BENZONATATE 100 MG PO CAPS
100.0000 mg | ORAL_CAPSULE | Freq: Three times a day (TID) | ORAL | 0 refills | Status: DC | PRN
Start: 2022-10-28 — End: 2022-11-21

## 2022-10-28 MED ORDER — TRAZODONE HCL 100 MG PO TABS
100.0000 mg | ORAL_TABLET | Freq: Every evening | ORAL | 2 refills | Status: DC | PRN
Start: 2022-10-28 — End: 2022-12-27

## 2022-10-28 MED ORDER — CYCLOBENZAPRINE HCL 5 MG PO TABS: 5.0000 mg | ORAL_TABLET | Freq: Every day | ORAL | 2 refills | Status: DC | PRN

## 2022-10-28 MED ORDER — ROSUVASTATIN CALCIUM 40 MG PO TABS
40.0000 mg | ORAL_TABLET | Freq: Every evening | ORAL | 0 refills | Status: DC
Start: 2022-10-28 — End: 2023-01-30

## 2022-10-28 NOTE — Progress Notes (Signed)
Established Patient Office Visit  Subjective:  Patient ID: Gregory [name], male    DOB: January 16, 1946  Age: 77 y.o. MRN: [id number]  Chief Complaint  Patient presents with   Follow-up    No new complaints, here for lab review and medication refills. C/o 2 wk h/o yellow sputum with nasal congestion and post nasal drip which is worse in the am and in the evening. Potassium normal on lab review.  No other concerns at this time.   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   Pneumothorax    Associated with pneumonia    Past Surgical History:  Procedure Laterality Date   Arm surgery     CARDIAC CATHETERIZATION N/A 12/05/2014   Procedure: Left Heart Cath and Coronary Angiography;  Surgeon: Lyn Records, MD;  Location: [facility] INVASIVE CV LAB;  Service: Cardiovascular;  Laterality: N/A;   CATARACT EXTRACTION W/PHACO Right 01/14/2013   Procedure: RIGHT EYE CATARACT EXTRACTION PHACO AND INTRAOCULAR LENS PLACEMENT ;  Surgeon: Gemma Payor, MD;  Location: [facility];  Service: Ophthalmology;  Laterality: Right;  CDE 54.97   CATARACT EXTRACTION W/PHACO Left 02/18/2013   Procedure: CATARACT EXTRACTION PHACO AND INTRAOCULAR LENS PLACEMENT (IOC);  Surgeon: Gemma Payor, MD;  Location: [facility];  Service: Ophthalmology;  Laterality: Left;  CDE 34.02   COLONOSCOPY WITH PROPOFOL N/A 07/13/2016   Procedure: COLONOSCOPY WITH PROPOFOL;  Surgeon: Scot Jun, MD;  Location: [facility] ENDOSCOPY;  Service: Endoscopy;  Laterality: N/A;   INGUINAL HERNIA REPAIR Right 09/09/2012   Procedure: HERNIA REPAIR INGUINAL INCARCERATED;  Surgeon: Kandis Cocking, MD;  Location: [facility];  Service: General;  Laterality: Right;   INSERTION OF MESH Right 09/09/2012   Procedure: INSERTION OF MESH;  Surgeon: Kandis Cocking, MD;  Location: [facility];  Service: General;  Laterality: Right;   LEFT HEART CATH AND CORONARY ANGIOGRAPHY Right 09/21/2021    Procedure: LEFT HEART CATH AND CORONARY ANGIOGRAPHY with intervention;  Surgeon: Laurier Nancy, MD;  Location: [facility] INVASIVE CV LAB;  Service: Cardiovascular;  Laterality: Right;    Social History   Socioeconomic History   Marital status: Widowed    Spouse name: Not on file   Number of children: 2   Years of education: Not on file   Highest education level: Not on file  Occupational History   Not on file  Tobacco Use   Smoking status: Former    Current packs/day: 0.00    Types: Cigarettes    Quit date: 01/28/1978    Years since quitting: 44.7   Smokeless tobacco: Never  Vaping Use   Vaping status: Never Used  Substance and Sexual Activity   Alcohol use: No    Alcohol/week: 0.0 standard drinks of alcohol   Drug use: No   Sexual activity: Not on file  Other Topics Concern   Not on file  Social History Narrative   Wife passed approx. 4 years ago. Has 2 daughters. Lives by himself now.    Social Determinants of Health   Financial Resource Strain: Not on file  Food Insecurity: No Food Insecurity (09/29/2022)   Hunger Vital Sign    Worried About Running Out of Food in the Last Year: Never true    Ran Out of Food in the Last Year: Never true  Transportation Needs: No Transportation Needs (09/29/2022)   PRAPARE - Transportation  Lack of Transportation (Medical): No    Lack of Transportation (Non-Medical): No  Physical Activity: Not on file  Stress: Not on file  Social Connections: Not on file  Intimate Partner Violence: Not At Risk (09/29/2022)   Humiliation, Afraid, Rape, and Kick questionnaire    Fear of Current or Ex-Partner: No    Emotionally Abused: No    Physically Abused: No    Sexually Abused: No    Family History  Problem Relation Age of Onset   COPD Father    Stroke Brother     No Known Allergies  Review of Systems  Constitutional: Negative.   HENT: Negative.    Eyes: Negative.   Respiratory: Negative.    Cardiovascular: Negative.   Gastrointestinal:  Negative.   Genitourinary:  Positive for frequency. Negative for urgency.  Musculoskeletal:  Positive for back pain and joint pain.  Skin: Negative.   Neurological: Negative.   Endo/Heme/Allergies: Negative.        Objective:   BP 132/78   Pulse 84   Ht 5\' 4"  (1.626 m)   Wt 182 lb 4.8 oz (82.7 kg)   SpO2 96%   BMI 31.29 kg/m   Vitals:   10/28/22 1446  BP: 132/78  Pulse: 84  Height: 5\' 4"  (1.626 m)  Weight: 182 lb 4.8 oz (82.7 kg)  SpO2: 96%  BMI (Calculated): 31.28    Physical Exam Vitals reviewed.  Constitutional:      Appearance: Normal appearance.  HENT:     Head: Normocephalic.     Left Ear: There is no impacted cerumen.     Nose: Nose normal.     Mouth/Throat:     Mouth: Mucous membranes are moist.     Pharynx: No posterior oropharyngeal erythema.  Eyes:     Extraocular Movements: Extraocular movements intact.     Pupils: Pupils are equal, round, and reactive to light.  Cardiovascular:     Rate and Rhythm: Regular rhythm.     Chest Wall: PMI is not displaced.     Pulses: Normal pulses.     Heart sounds: Normal heart sounds. No murmur heard. Pulmonary:     Effort: Pulmonary effort is normal.     Breath sounds: Normal air entry. No rhonchi or rales.  Abdominal:     General: Abdomen is flat. Bowel sounds are normal. There is no distension.     Palpations: Abdomen is soft. There is no hepatomegaly, splenomegaly or mass.     Tenderness: There is no abdominal tenderness.  Musculoskeletal:        General: Normal range of motion.     Cervical back: Normal range of motion and neck supple.     Right lower leg: No edema.     Left lower leg: No edema.  Skin:    General: Skin is warm and dry.  Neurological:     General: No focal deficit present.     Mental Status: He is alert and oriented to person, place, and time.     Cranial Nerves: No cranial nerve deficit.     Motor: No weakness.  Psychiatric:        Mood and Affect: Mood normal.        Behavior:  Behavior normal.      No results found for any visits on 10/28/22.      Assessment & Plan:  As per problem list  Problem List Items Addressed This Visit       Cardiovascular and Mediastinum  Essential hypertension   Relevant Orders   CBC With Diff/Platelet     Respiratory   Seasonal allergic rhinitis due to pollen - Primary   Relevant Medications   Azelastine HCl 137 MCG/SPRAY SOLN   benzonatate (TESSALON) 100 MG capsule     Musculoskeletal and Integument   Osteoarthritis of lumbar spine   Relevant Medications   cyclobenzaprine (FLEXERIL) 5 MG tablet     Other   Dyslipidemia   Relevant Orders   Comprehensive metabolic panel   Lipid panel    Return in about 4 weeks (around 11/25/2022) for awv with labs prior.   Total time spent: 20 minutes  Luna Fuse, MD  10/28/2022   This document may have been prepared by [facility] Voice Recognition software and as such may include unintentional dictation errors.

## 2022-11-13 ENCOUNTER — Other Ambulatory Visit: Payer: Self-pay | Admitting: Cardiovascular Disease

## 2022-11-13 ENCOUNTER — Other Ambulatory Visit: Payer: Self-pay | Admitting: Internal Medicine

## 2022-11-20 ENCOUNTER — Other Ambulatory Visit: Payer: Self-pay | Admitting: Internal Medicine

## 2022-11-20 DIAGNOSIS — J301 Allergic rhinitis due to pollen: Secondary | ICD-10-CM

## 2022-11-29 ENCOUNTER — Encounter: Payer: Self-pay | Admitting: Cardiovascular Disease

## 2022-11-29 ENCOUNTER — Ambulatory Visit (INDEPENDENT_AMBULATORY_CARE_PROVIDER_SITE_OTHER): Payer: 59 | Admitting: Cardiovascular Disease

## 2022-11-29 VITALS — BP 118/80 | HR 82 | Ht 64.0 in | Wt 182.0 lb

## 2022-11-29 DIAGNOSIS — I5033 Acute on chronic diastolic (congestive) heart failure: Secondary | ICD-10-CM | POA: Diagnosis not present

## 2022-11-29 DIAGNOSIS — I10 Essential (primary) hypertension: Secondary | ICD-10-CM | POA: Diagnosis not present

## 2022-11-29 DIAGNOSIS — E785 Hyperlipidemia, unspecified: Secondary | ICD-10-CM | POA: Diagnosis not present

## 2022-11-29 DIAGNOSIS — R0602 Shortness of breath: Secondary | ICD-10-CM

## 2022-11-29 DIAGNOSIS — I340 Nonrheumatic mitral (valve) insufficiency: Secondary | ICD-10-CM

## 2022-11-29 NOTE — Progress Notes (Signed)
Cardiology Office Note   Date:  11/29/2022   ID:  Gregory [name], DOB [birth year], MRN [id number]  PCP:  Sherron Monday, MD  Cardiologist:  Adrian Blackwater, MD      History of Present Illness: Gregory [name] is a 77 y.o. 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   Pneumothorax    Associated with pneumonia     Past Surgical History:  Procedure Laterality Date   Arm surgery     CARDIAC CATHETERIZATION N/A 12/05/2014   Procedure: Left Heart Cath and Coronary Angiography;  Surgeon: Lyn Records, MD;  Location: [facility] INVASIVE CV LAB;  Service: Cardiovascular;  Laterality: N/A;   CATARACT EXTRACTION W/PHACO Right 01/14/2013   Procedure: RIGHT EYE CATARACT EXTRACTION PHACO AND INTRAOCULAR LENS PLACEMENT ;  Surgeon: Gemma Payor, MD;  Location: [facility];  Service: Ophthalmology;  Laterality: Right;  CDE 54.97   CATARACT EXTRACTION W/PHACO Left 02/18/2013   Procedure: CATARACT EXTRACTION PHACO AND INTRAOCULAR LENS PLACEMENT (IOC);  Surgeon: Gemma Payor, MD;  Location: [facility];  Service: Ophthalmology;  Laterality: Left;  CDE 34.02   COLONOSCOPY WITH PROPOFOL N/A 07/13/2016   Procedure: COLONOSCOPY WITH PROPOFOL;  Surgeon: Scot Jun, MD;  Location: [facility] ENDOSCOPY;  Service: Endoscopy;  Laterality: N/A;   INGUINAL HERNIA REPAIR Right 09/09/2012   Procedure: HERNIA REPAIR INGUINAL INCARCERATED;  Surgeon: Kandis Cocking, MD;  Location: [facility];  Service: General;  Laterality: Right;   INSERTION OF MESH Right 09/09/2012   Procedure: INSERTION OF MESH;  Surgeon: Kandis Cocking, MD;  Location: [facility];  Service: General;  Laterality: Right;   LEFT HEART CATH AND CORONARY ANGIOGRAPHY Right 09/21/2021   Procedure: LEFT HEART CATH AND CORONARY ANGIOGRAPHY with intervention;  Surgeon: Laurier Nancy, MD;   Location: [facility] INVASIVE CV LAB;  Service: Cardiovascular;  Laterality: Right;     Current Outpatient Medications  Medication Sig Dispense Refill   acetaminophen (TYLENOL) 500 MG tablet Take 1,500 mg by mouth every 6 (six) hours as needed for moderate pain.     allopurinol (ZYLOPRIM) 100 MG tablet Take 1 tablet by mouth once daily 90 tablet 0   aspirin 81 MG chewable tablet Chew 1 tablet (81 mg total) by mouth daily.     Azelastine HCl 137 MCG/SPRAY SOLN Place 1 spray into the nose daily. 30 mL 2   benzonatate (TESSALON) 100 MG capsule Take 1 capsule by mouth three times daily as needed for cough 30 capsule 0   Budeson-Glycopyrrol-Formoterol (BREZTRI AEROSPHERE) 160-9-4.8 MCG/ACT AERO INHALE 2 PUFFS TWICE DAILY 11 g 2   cetirizine (ZYRTEC) 10 MG tablet TAKE 1 TABLET BY MOUTH ONCE DAILY IN THE MORNING 90 tablet 0   clopidogrel (PLAVIX) 75 MG tablet Take 1 tablet by mouth once daily 90 tablet 0   Colchicine 0.6 MG CAPS TAKE 2 CAPSULES BY MOUTH FOR THE FIRST DOSE, THEN 1 CAPSULE AN HOUR LATER, THEN 1 CAPSULE DAILY TILL PAIN FREE 30 capsule 0   cyclobenzaprine (FLEXERIL) 5 MG tablet Take 1 tablet (5 mg total) by mouth daily as needed for muscle spasms (back spasm). 30 tablet 2   fluticasone (FLONASE) 50 MCG/ACT nasal spray Place 1 spray into both nostrils 2 (two) times daily.  furosemide (LASIX) 20 MG tablet Take 1 tablet by mouth once daily 90 tablet 0   isosorbide mononitrate (IMDUR) 30 MG 24 hr tablet Take 1 tablet by mouth once daily 90 tablet 0   metoprolol tartrate (LOPRESSOR) 25 MG tablet Take 0.5 tablets (12.5 mg total) by mouth 2 (two) times daily. 90 tablet 0   rosuvastatin (CRESTOR) 40 MG tablet Take 1 tablet (40 mg total) by mouth every evening. 90 tablet 0   sodium bicarbonate 650 MG tablet Take 1 tablet by mouth twice daily 100 tablet 0   spironolactone (ALDACTONE) 25 MG tablet Take 1 tablet by mouth once daily 30 tablet 0   traZODone (DESYREL) 100 MG tablet Take 1 tablet (100 mg  total) by mouth at bedtime as needed for sleep. 30 tablet 2   No current facility-administered medications for this visit.    Allergies:   Patient has no known allergies.    Social History:   reports that he quit smoking about 44 years ago. His smoking use included cigarettes. He has never used smokeless tobacco. He reports that he does not drink alcohol and does not use drugs.   Family History:  family history includes COPD in his father; Stroke in his brother.    ROS:     Review of Systems  Constitutional: Negative.   HENT: Negative.    Eyes: Negative.   Respiratory: Negative.    Gastrointestinal: Negative.   Genitourinary: Negative.   Musculoskeletal: Negative.   Skin: Negative.   Neurological: Negative.   Endo/Heme/Allergies: Negative.   Psychiatric/Behavioral: Negative.    All other systems reviewed and are negative.     All other systems are reviewed and negative.    PHYSICAL EXAM: VS:  BP 118/80   Pulse 82   Ht 5\' 4"  (1.626 m)   Wt 182 lb (82.6 kg)   SpO2 97%   BMI 31.24 kg/m  , BMI Body mass index is 31.24 kg/m. Last weight:  Wt Readings from Last 3 Encounters:  11/29/22 182 lb (82.6 kg)  10/28/22 182 lb 4.8 oz (82.7 kg)  10/27/22 183 lb 6.4 oz (83.2 kg)     Physical Exam Vitals reviewed.  Constitutional:      Appearance: Normal appearance. He is normal weight.  HENT:     Head: Normocephalic.     Nose: Nose normal.     Mouth/Throat:     Mouth: Mucous membranes are moist.  Eyes:     Pupils: Pupils are equal, round, and reactive to light.  Cardiovascular:     Rate and Rhythm: Normal rate and regular rhythm.     Pulses: Normal pulses.     Heart sounds: Normal heart sounds.  Pulmonary:     Effort: Pulmonary effort is normal.  Abdominal:     General: Abdomen is flat. Bowel sounds are normal.  Musculoskeletal:        General: Normal range of motion.     Cervical back: Normal range of motion.  Skin:    General: Skin is warm.  Neurological:      General: No focal deficit present.     Mental Status: He is alert.  Psychiatric:        Mood and Affect: Mood normal.       EKG:   Recent Labs: 09/29/2022: ALT 27 09/30/2022: BUN 18; Creatinine, Ser 1.74; Hemoglobin 13.3; Magnesium 2.3; Platelets 186; Sodium 136 10/27/2022: Potassium 4.2    Lipid Panel    Component Value Date/Time   CHOL  102 07/08/2022 1023   TRIG 183 (H) 07/08/2022 1023   HDL 37 (L) 07/08/2022 1023   CHOLHDL 3.9 12/05/2014 0709   VLDL 21 12/05/2014 0709   LDLCALC 35 07/08/2022 1023      Other studies Reviewed: Additional studies/ records that were reviewed today include:  Review of the above records demonstrates:       No data to display            ASSESSMENT AND PLAN:    ICD-10-CM   1. Essential hypertension  I10     2. Dyslipidemia  E78.5     3. Nonrheumatic mitral valve regurgitation  I34.0     4. CHF (congestive heart failure), NYHA class III, acute on chronic, diastolic (HCC)  I50.33     5. SOB (shortness of breath)  R06.02    feels much better now       Problem List Items Addressed This Visit       Cardiovascular and Mediastinum   Essential hypertension - Primary   Nonrheumatic mitral valve regurgitation     Other   Dyslipidemia   SOB (shortness of breath)   Other Visit Diagnoses     CHF (congestive heart failure), NYHA class III, acute on chronic, diastolic (HCC)              Disposition:   Return in about 3 months (around 03/01/2023).    Total time spent: 30 minutes  Signed,  Adrian Blackwater, MD  11/29/2022 3:13 PM    [facility]

## 2022-12-01 IMAGING — DX DG KNEE COMPLETE 4+V*R*
4 series · 4 of 4 positions shown · non-contrast
Comparison: None.

CLINICAL DATA: Pain

EXAM:
RIGHT KNEE - COMPLETE 4+ VIEW

[knee ap]
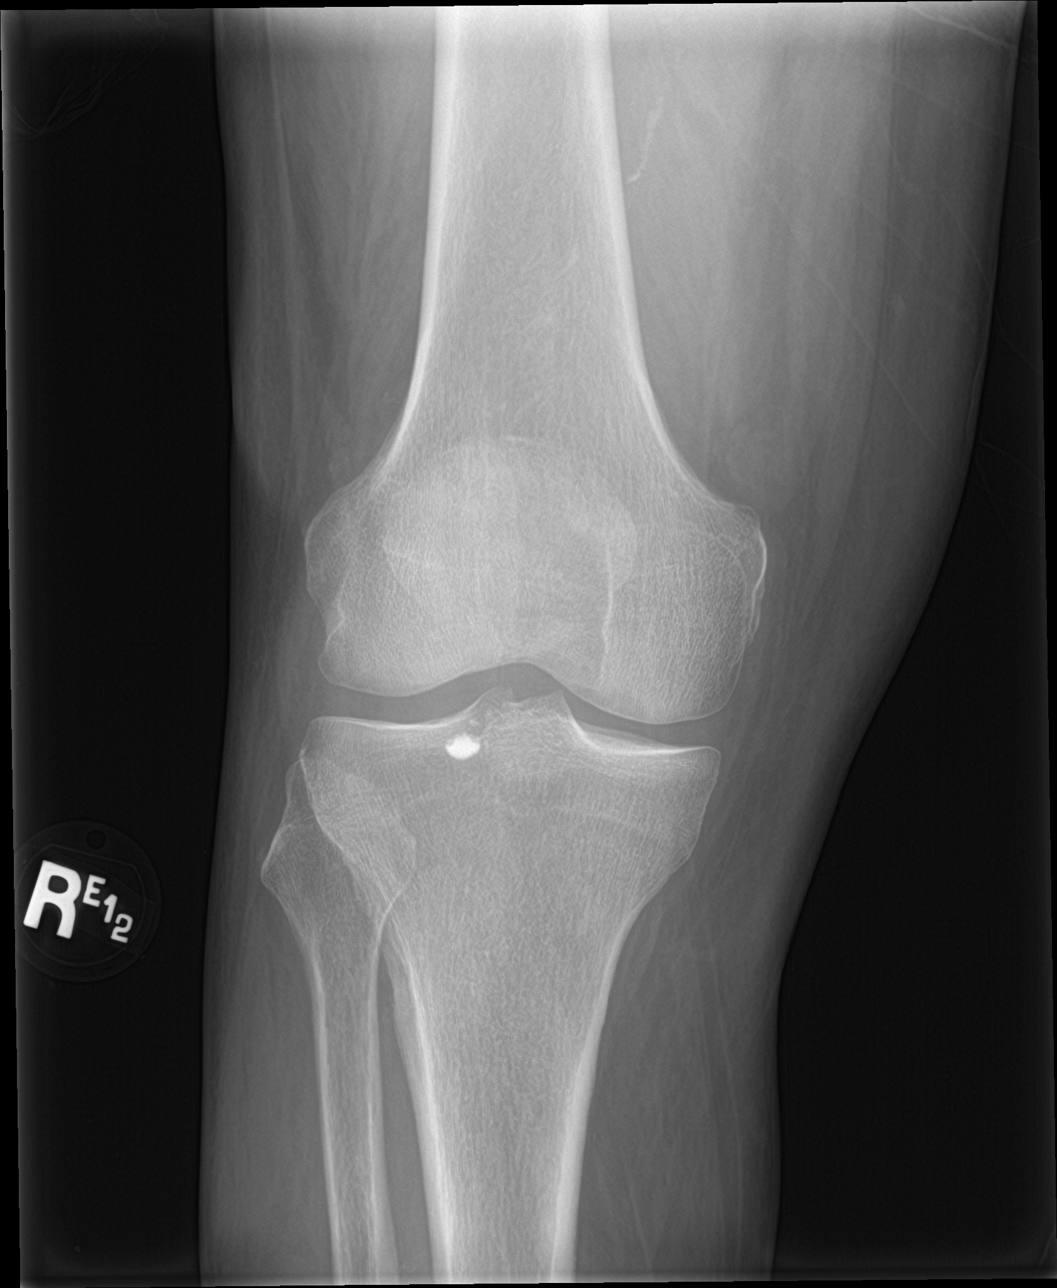

[knee obl (1 of 2)]
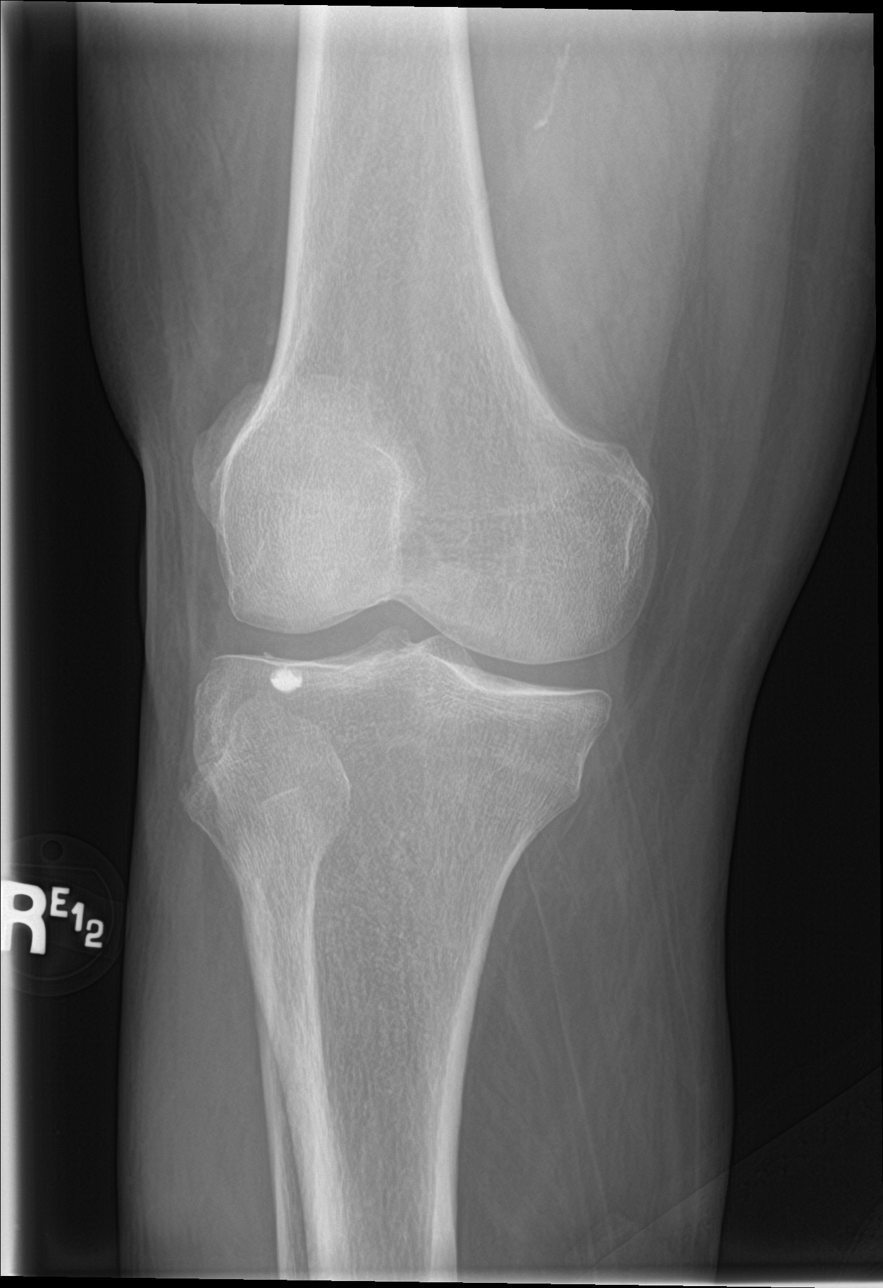

[knee obl (2 of 2)]
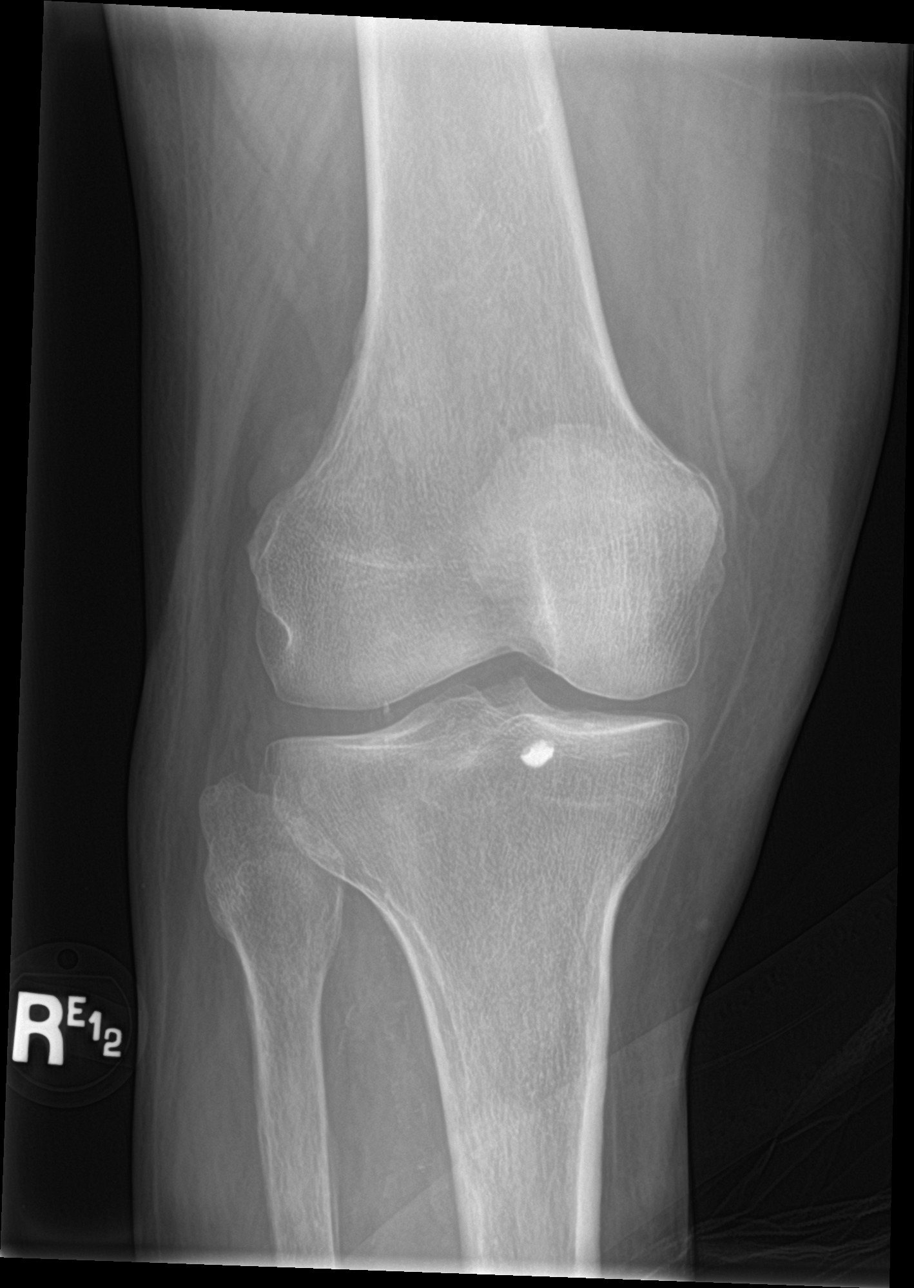

[knee lat]
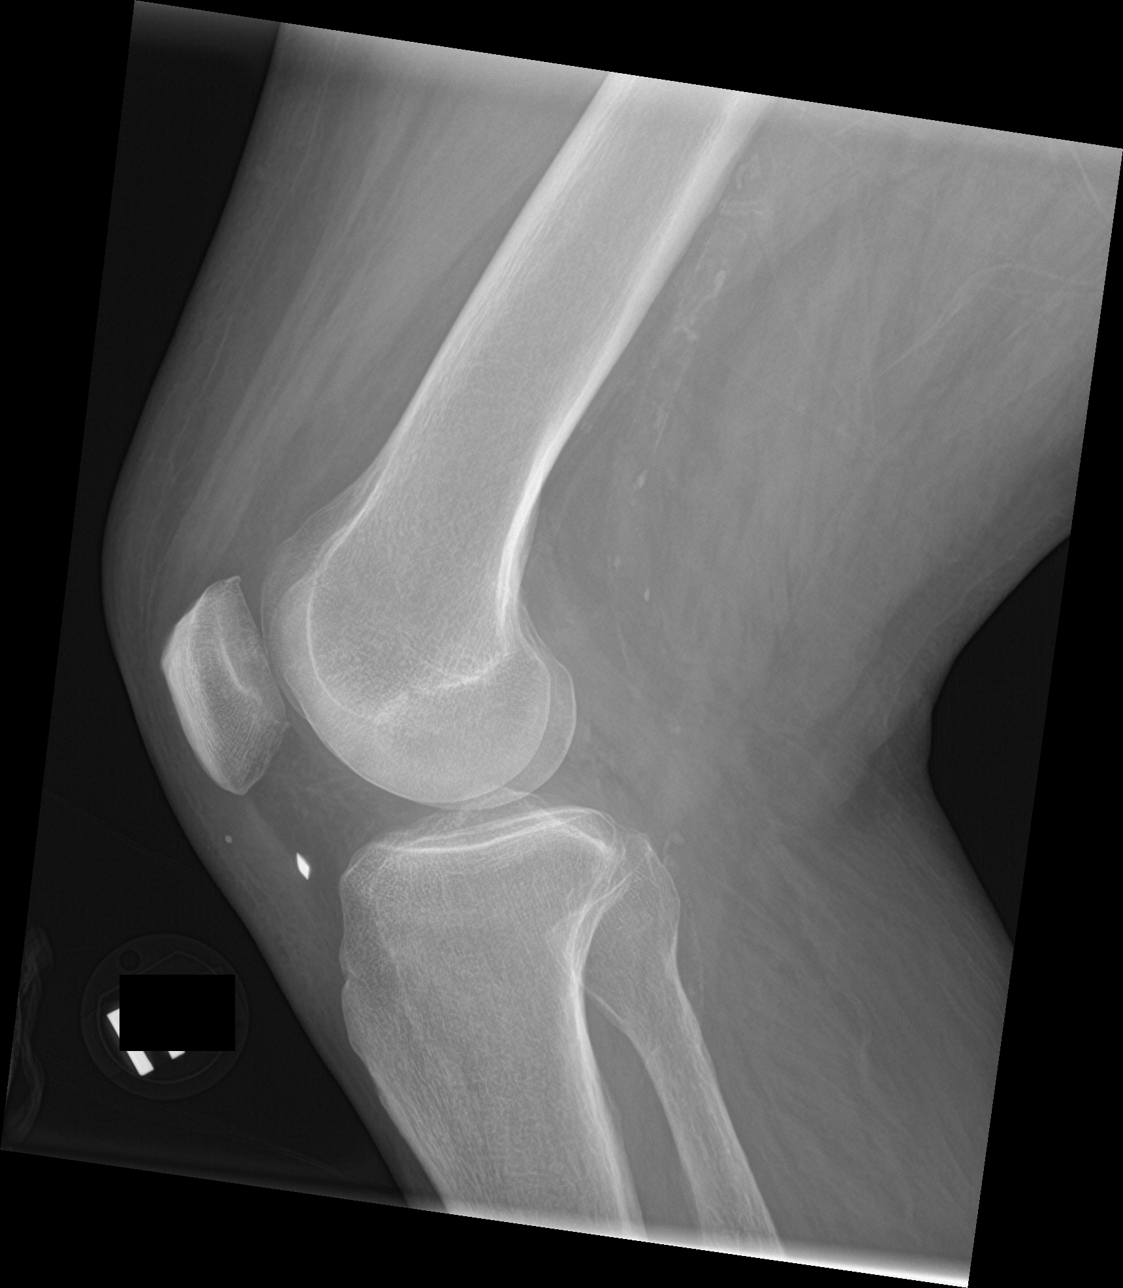

[4 of 4 positions shown; findings below may reference images not displayed]

FINDINGS: There is no evidence of acute fracture or dislocation. There is no
joint effusion. There is a 5 mm hyperdensity, either dense calcium
or metallic fragment overlying and the anterior aspect of Hoffa's
fat pad. Vascular calcifications. Mild patellofemoral predominant
osteoarthritis.
IMPRESSION: No acute osseous abnormality. Mild patellofemoral predominant
osteoarthritis.

Dense calcification or metallic fragment overlying the anterior
inferior aspect of Hoffa's fat pad of uncertain etiology and of
questionable clinical significance. No priors for comparison.

## 2022-12-01 IMAGING — DX DG HIP (WITH OR WITHOUT PELVIS) 2-3V*R*
3 series · 3 of 3 positions shown · non-contrast
Comparison: 01/27/2021

CLINICAL DATA: Pain

EXAM:
DG HIP (WITH OR WITHOUT PELVIS) 2-3V RIGHT

[pelvis ap]
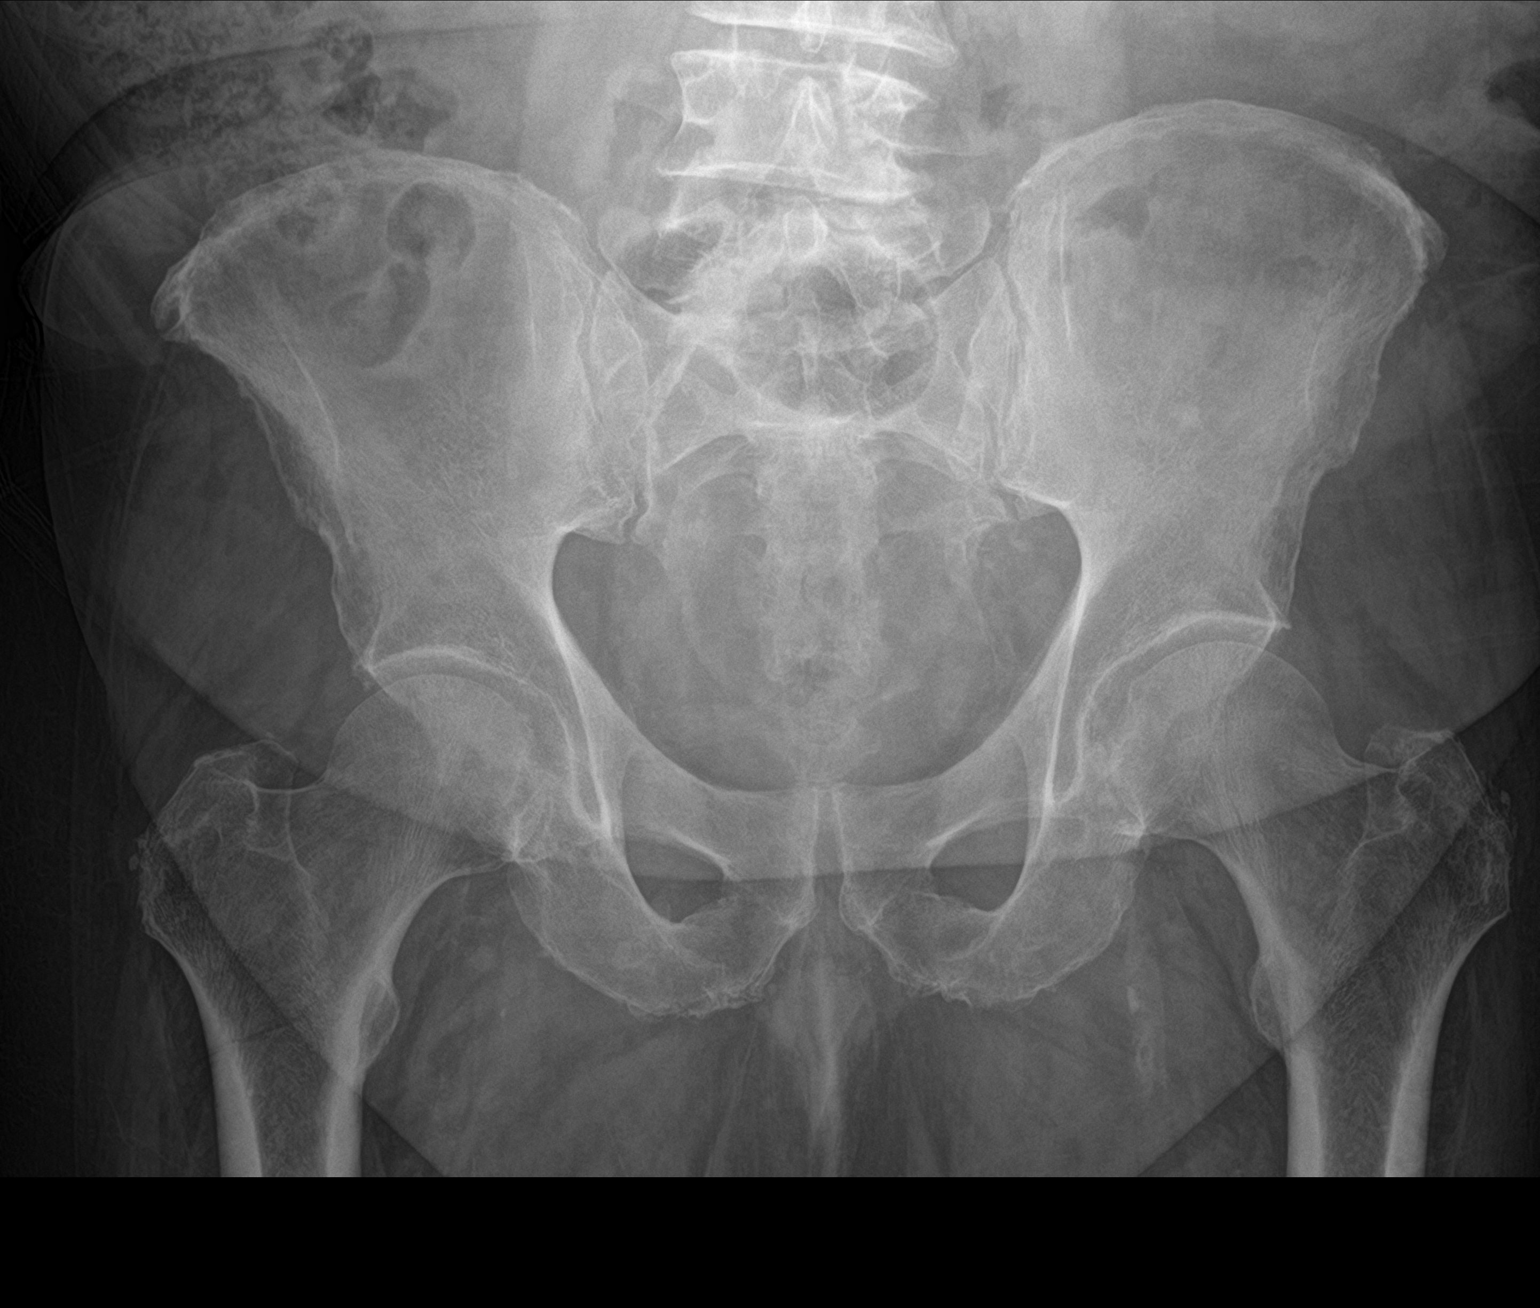

[hip ap]
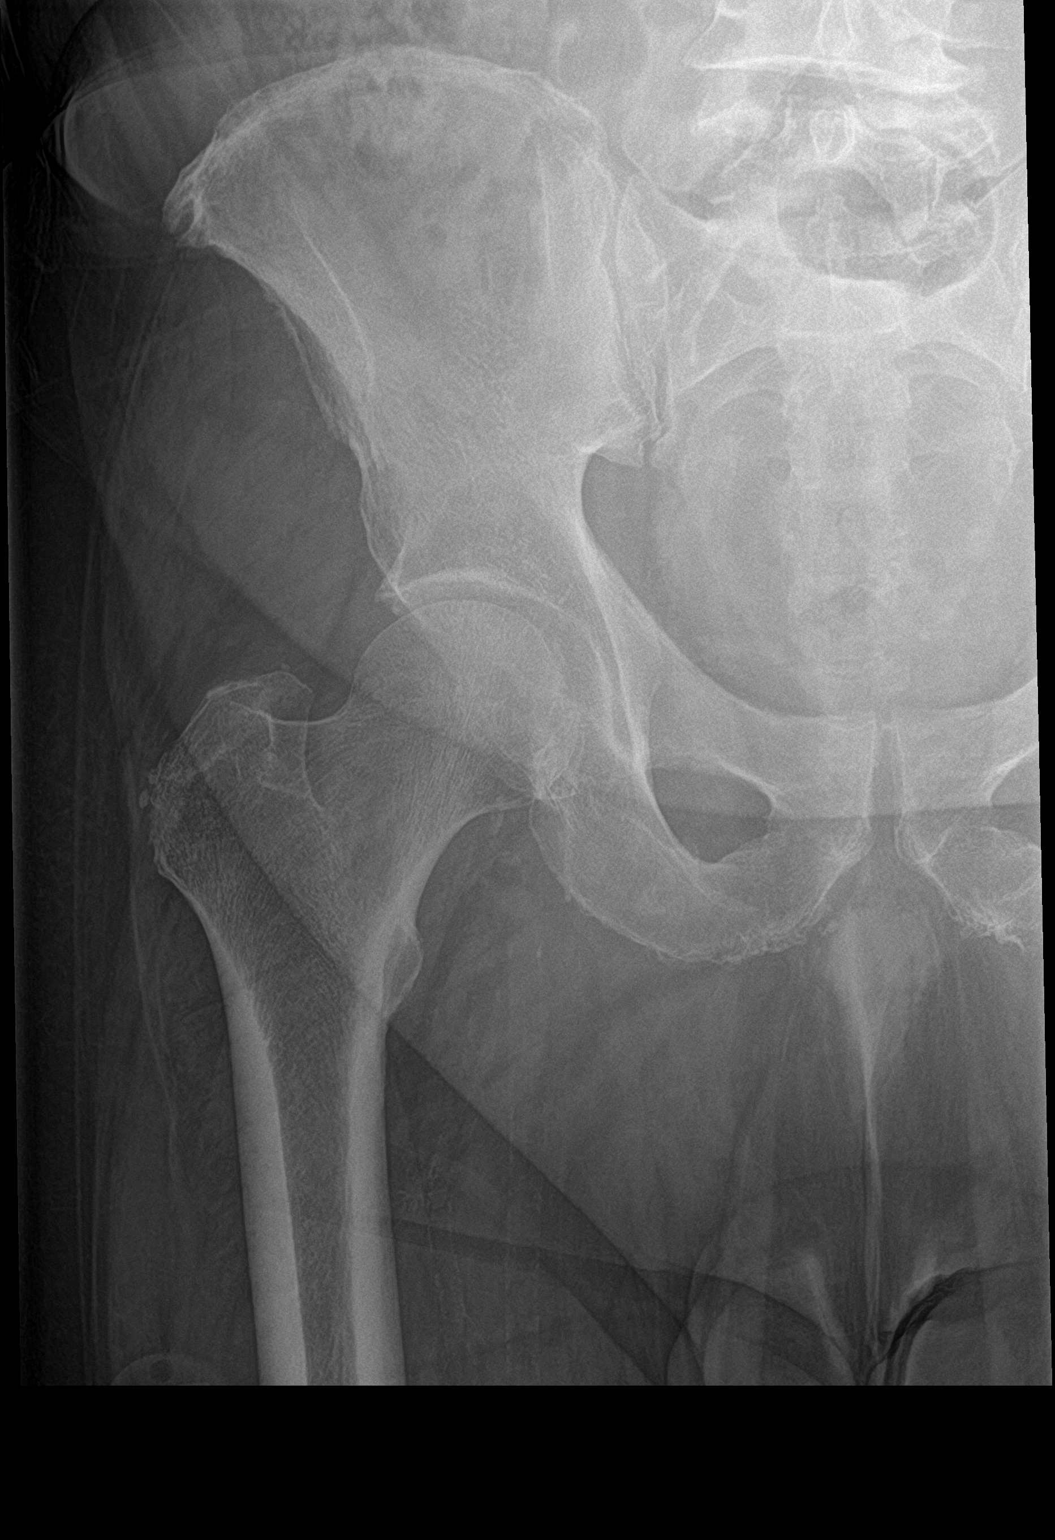

[hip lat]
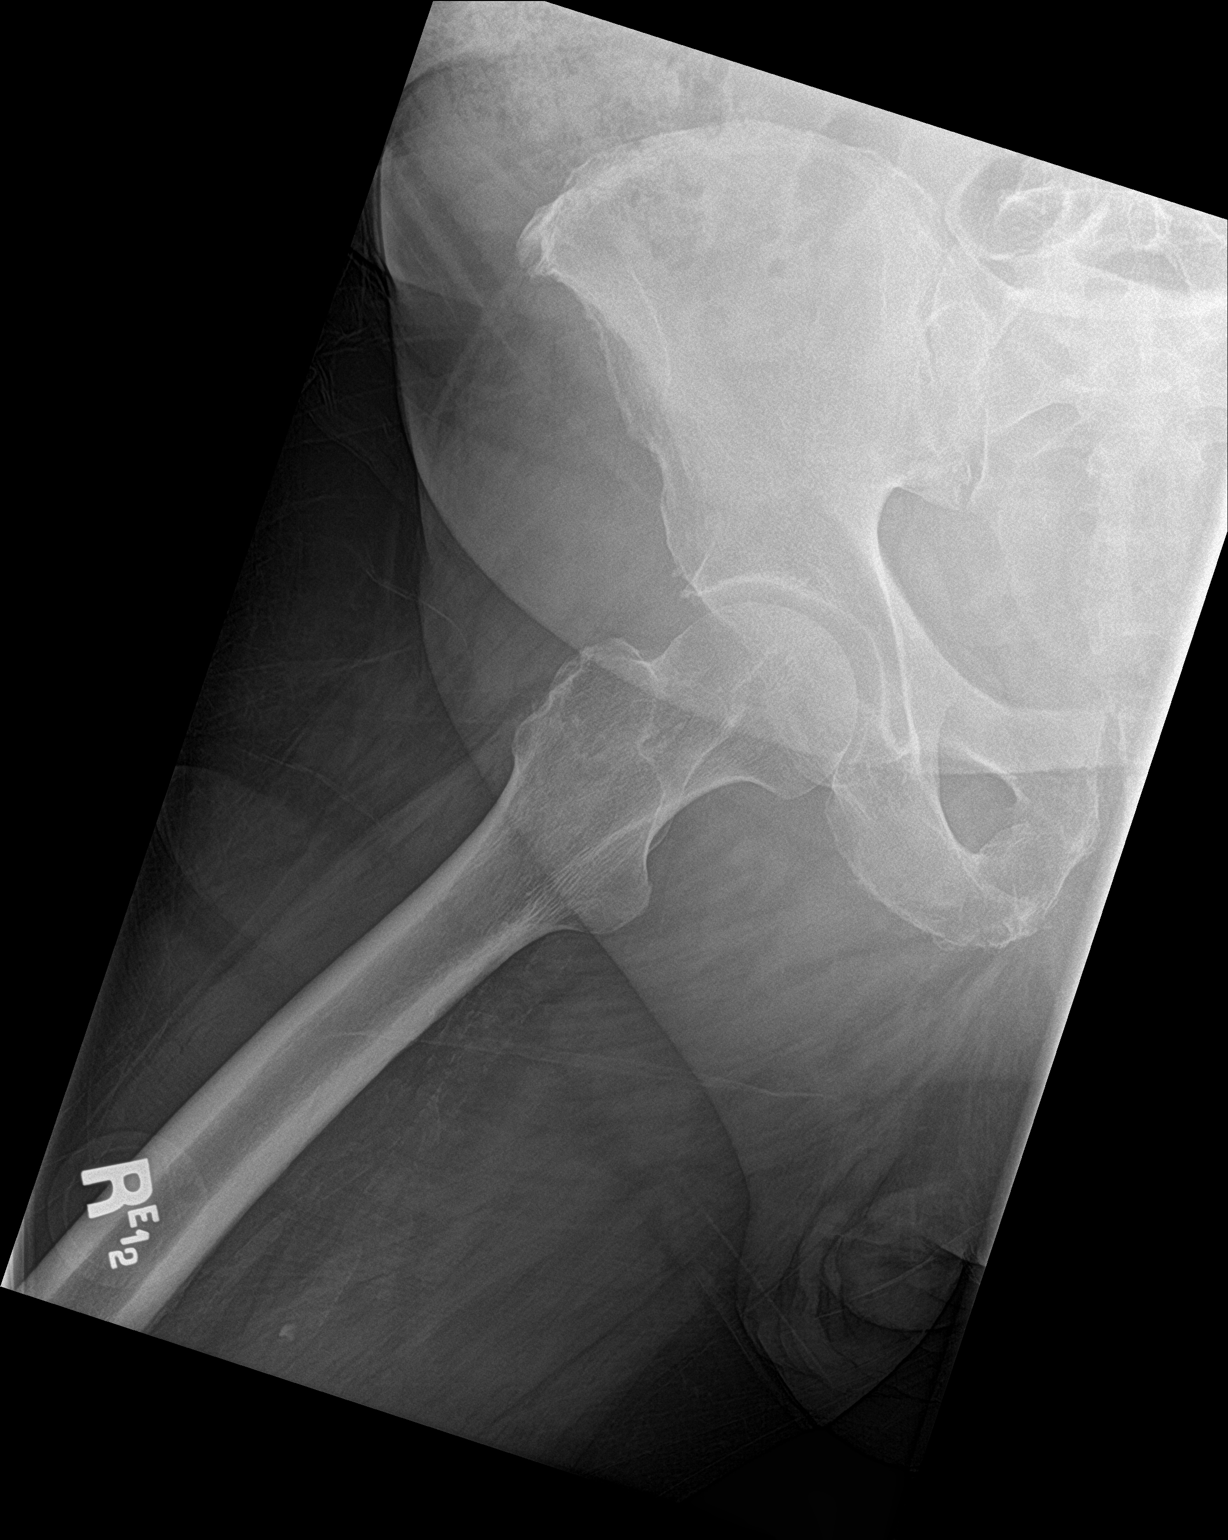

[3 of 3 positions shown; findings below may reference images not displayed]

FINDINGS: There is no evidence of acute fracture or dislocation. Mild
bilateral hip osteoarthritis. Lower lumbar spine degenerative
changes.
IMPRESSION: No acute osseous abnormality.  Mild osteoarthritis of the hips.

## 2022-12-02 ENCOUNTER — Ambulatory Visit: Payer: 59 | Admitting: Internal Medicine

## 2022-12-15 DIAGNOSIS — E8722 Chronic metabolic acidosis: Secondary | ICD-10-CM | POA: Diagnosis not present

## 2022-12-15 DIAGNOSIS — N1832 Chronic kidney disease, stage 3b: Secondary | ICD-10-CM | POA: Diagnosis not present

## 2022-12-15 DIAGNOSIS — I129 Hypertensive chronic kidney disease with stage 1 through stage 4 chronic kidney disease, or unspecified chronic kidney disease: Secondary | ICD-10-CM | POA: Diagnosis not present

## 2022-12-15 DIAGNOSIS — R809 Proteinuria, unspecified: Secondary | ICD-10-CM | POA: Diagnosis not present

## 2022-12-15 DIAGNOSIS — E875 Hyperkalemia: Secondary | ICD-10-CM | POA: Diagnosis not present

## 2022-12-15 DIAGNOSIS — N281 Cyst of kidney, acquired: Secondary | ICD-10-CM | POA: Diagnosis not present

## 2022-12-16 DIAGNOSIS — E875 Hyperkalemia: Secondary | ICD-10-CM | POA: Diagnosis not present

## 2022-12-16 DIAGNOSIS — N1832 Chronic kidney disease, stage 3b: Secondary | ICD-10-CM | POA: Diagnosis not present

## 2022-12-16 DIAGNOSIS — E8722 Chronic metabolic acidosis: Secondary | ICD-10-CM | POA: Diagnosis not present

## 2022-12-16 DIAGNOSIS — I129 Hypertensive chronic kidney disease with stage 1 through stage 4 chronic kidney disease, or unspecified chronic kidney disease: Secondary | ICD-10-CM | POA: Diagnosis not present

## 2022-12-16 DIAGNOSIS — R809 Proteinuria, unspecified: Secondary | ICD-10-CM | POA: Diagnosis not present

## 2022-12-18 ENCOUNTER — Other Ambulatory Visit: Payer: Self-pay | Admitting: Cardiovascular Disease

## 2022-12-18 ENCOUNTER — Other Ambulatory Visit: Payer: Self-pay | Admitting: Internal Medicine

## 2022-12-18 DIAGNOSIS — N184 Chronic kidney disease, stage 4 (severe): Secondary | ICD-10-CM

## 2022-12-27 ENCOUNTER — Encounter: Payer: Self-pay | Admitting: Internal Medicine

## 2022-12-27 ENCOUNTER — Ambulatory Visit (INDEPENDENT_AMBULATORY_CARE_PROVIDER_SITE_OTHER): Payer: 59 | Admitting: Internal Medicine

## 2022-12-27 DIAGNOSIS — Z0001 Encounter for general adult medical examination with abnormal findings: Secondary | ICD-10-CM

## 2022-12-27 DIAGNOSIS — Z1331 Encounter for screening for depression: Secondary | ICD-10-CM | POA: Diagnosis not present

## 2022-12-27 DIAGNOSIS — M47816 Spondylosis without myelopathy or radiculopathy, lumbar region: Secondary | ICD-10-CM | POA: Diagnosis not present

## 2022-12-27 DIAGNOSIS — G4700 Insomnia, unspecified: Secondary | ICD-10-CM | POA: Diagnosis not present

## 2022-12-27 DIAGNOSIS — J301 Allergic rhinitis due to pollen: Secondary | ICD-10-CM | POA: Diagnosis not present

## 2022-12-27 MED ORDER — CYCLOBENZAPRINE HCL 5 MG PO TABS: 5.0000 mg | ORAL_TABLET | Freq: Every day | ORAL | 2 refills | Status: DC | PRN

## 2022-12-27 MED ORDER — AZELASTINE HCL 137 MCG/SPRAY NA SOLN
1.0000 | Freq: Every day | NASAL | 2 refills | Status: DC
Start: 2022-12-27 — End: 2023-03-31

## 2022-12-27 MED ORDER — TRAZODONE HCL 100 MG PO TABS: 100.0000 mg | ORAL_TABLET | Freq: Every evening | ORAL | 2 refills | Status: DC | PRN

## 2022-12-27 NOTE — Progress Notes (Signed)
Established Patient Office Visit  Subjective:  Patient ID: Gregory [name], male    DOB: January 14, 1946  Age: 77 y.o. MRN: [id number]  No chief complaint on file.   No new complaints, here for AWV refer to quality metrics and scanned documents.     No other concerns at this time.   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   Pneumothorax    Associated with pneumonia    Past Surgical History:  Procedure Laterality Date   Arm surgery     CARDIAC CATHETERIZATION N/A 12/05/2014   Procedure: Left Heart Cath and Coronary Angiography;  Surgeon: Lyn Records, MD;  Location: [facility] INVASIVE CV LAB;  Service: Cardiovascular;  Laterality: N/A;   CATARACT EXTRACTION W/PHACO Right 01/14/2013   Procedure: RIGHT EYE CATARACT EXTRACTION PHACO AND INTRAOCULAR LENS PLACEMENT ;  Surgeon: Gemma Payor, MD;  Location: [facility];  Service: Ophthalmology;  Laterality: Right;  CDE 54.97   CATARACT EXTRACTION W/PHACO Left 02/18/2013   Procedure: CATARACT EXTRACTION PHACO AND INTRAOCULAR LENS PLACEMENT (IOC);  Surgeon: Gemma Payor, MD;  Location: [facility];  Service: Ophthalmology;  Laterality: Left;  CDE 34.02   COLONOSCOPY WITH PROPOFOL N/A 07/13/2016   Procedure: COLONOSCOPY WITH PROPOFOL;  Surgeon: Scot Jun, MD;  Location: [facility] ENDOSCOPY;  Service: Endoscopy;  Laterality: N/A;   INGUINAL HERNIA REPAIR Right 09/09/2012   Procedure: HERNIA REPAIR INGUINAL INCARCERATED;  Surgeon: Kandis Cocking, MD;  Location: [facility];  Service: General;  Laterality: Right;   INSERTION OF MESH Right 09/09/2012   Procedure: INSERTION OF MESH;  Surgeon: Kandis Cocking, MD;  Location: [facility];  Service: General;  Laterality: Right;   LEFT HEART CATH AND CORONARY ANGIOGRAPHY Right 09/21/2021   Procedure: LEFT HEART CATH AND CORONARY ANGIOGRAPHY with intervention;  Surgeon: Laurier Nancy, MD;  Location: [facility] INVASIVE CV LAB;  Service:  Cardiovascular;  Laterality: Right;    Social History   Socioeconomic History   Marital status: Widowed    Spouse name: Not on file   Number of children: 2   Years of education: Not on file   Highest education level: Not on file  Occupational History   Not on file  Tobacco Use   Smoking status: Former    Current packs/day: 0.00    Types: Cigarettes    Quit date: 01/28/1978    Years since quitting: 44.9   Smokeless tobacco: Never  Vaping Use   Vaping status: Never Used  Substance and Sexual Activity   Alcohol use: No    Alcohol/week: 0.0 standard drinks of alcohol   Drug use: No   Sexual activity: Not on file  Other Topics Concern   Not on file  Social History Narrative   Wife passed approx. 4 years ago. Has 1 living daughter. Lives by himself now.    Social Determinants of Health   Financial Resource Strain: Not on file  Food Insecurity: No Food Insecurity (09/29/2022)   Hunger Vital Sign    Worried About Running Out of Food in the Last Year: Never true    Ran Out of Food in the Last Year: Never true  Transportation Needs: No Transportation Needs (09/29/2022)   PRAPARE - Administrator, Civil Service (Medical): No    Lack of Transportation (Non-Medical): No  Physical Activity: Not on file  Stress: Not on file  Social  Connections: Not on file  Intimate Partner Violence: Not At Risk (09/29/2022)   Humiliation, Afraid, Rape, and Kick questionnaire    Fear of Current or Ex-Partner: No    Emotionally Abused: No    Physically Abused: No    Sexually Abused: No    Family History  Problem Relation Age of Onset   COPD Father    Stroke Brother    Stroke Son     No Known Allergies  Outpatient Medications Prior to Visit  Medication Sig   dapagliflozin propanediol (FARXIGA) 5 MG TABS tablet Take 5 mg by mouth daily.   acetaminophen (TYLENOL) 500 MG tablet Take 1,500 mg by mouth every 6 (six) hours as needed for moderate pain.   allopurinol (ZYLOPRIM) 100 MG  tablet Take 1 tablet by mouth once daily   aspirin 81 MG chewable tablet Chew 1 tablet (81 mg total) by mouth daily.   benzonatate (TESSALON) 100 MG capsule Take 1 capsule by mouth three times daily as needed for cough   Budeson-Glycopyrrol-Formoterol (BREZTRI AEROSPHERE) 160-9-4.8 MCG/ACT AERO Inhale 2 puffs by mouth twice daily   cetirizine (ZYRTEC) 10 MG tablet TAKE 1 TABLET BY MOUTH ONCE DAILY IN THE MORNING   clopidogrel (PLAVIX) 75 MG tablet Take 1 tablet by mouth once daily   Colchicine 0.6 MG CAPS TAKE 2 CAPSULES BY MOUTH FOR THE FIRST DOSE, THEN TAKE 1 CAPSULE AN HOUR LATER, THEN TAKE 1 CAPSULE DAILY TILL  PAIN  FREE.   fluticasone (FLONASE) 50 MCG/ACT nasal spray Place 1 spray into both nostrils 2 (two) times daily.   furosemide (LASIX) 20 MG tablet Take 1 tablet by mouth once daily   isosorbide mononitrate (IMDUR) 30 MG 24 hr tablet Take 1 tablet by mouth once daily   lisinopril (ZESTRIL) 40 MG tablet Take 40 mg by mouth daily.   metoprolol tartrate (LOPRESSOR) 25 MG tablet Take 0.5 tablets (12.5 mg total) by mouth 2 (two) times daily.   rosuvastatin (CRESTOR) 40 MG tablet Take 1 tablet (40 mg total) by mouth every evening.   sodium bicarbonate 650 MG tablet Take 1 tablet by mouth twice daily   spironolactone (ALDACTONE) 25 MG tablet Take 1 tablet by mouth once daily   [DISCONTINUED] Azelastine HCl 137 MCG/SPRAY SOLN Place 1 spray into the nose daily.   [DISCONTINUED] cyclobenzaprine (FLEXERIL) 5 MG tablet Take 1 tablet (5 mg total) by mouth daily as needed for muscle spasms (back spasm).   [DISCONTINUED] traZODone (DESYREL) 100 MG tablet Take 1 tablet (100 mg total) by mouth at bedtime as needed for sleep.   No facility-administered medications prior to visit.    Review of Systems  Constitutional: Negative.   HENT: Negative.    Eyes: Negative.   Respiratory: Negative.    Cardiovascular: Negative.   Gastrointestinal: Negative.   Genitourinary:  Positive for frequency. Negative  for urgency.  Musculoskeletal:  Positive for back pain and joint pain.  Skin: Negative.   Neurological: Negative.   Endo/Heme/Allergies: Negative.        Objective:   BP 110/75   Pulse 88   Ht 5\' 4"  (1.626 m)   Wt 179 lb 4.8 oz (81.3 kg)   SpO2 98%   BMI 30.78 kg/m   Vitals:   12/27/22 1418  BP: 110/75  Pulse: 88  Height: 5\' 4"  (1.626 m)  Weight: 179 lb 4.8 oz (81.3 kg)  SpO2: 98%  BMI (Calculated): 30.76    Physical Exam Vitals reviewed.  Constitutional:  Appearance: Normal appearance.  HENT:     Head: Normocephalic.     Left Ear: There is no impacted cerumen.     Nose: Nose normal.     Mouth/Throat:     Mouth: Mucous membranes are moist.     Pharynx: No posterior oropharyngeal erythema.  Eyes:     Extraocular Movements: Extraocular movements intact.     Pupils: Pupils are equal, round, and reactive to light.  Cardiovascular:     Rate and Rhythm: Regular rhythm.     Chest Wall: PMI is not displaced.     Pulses: Normal pulses.     Heart sounds: Normal heart sounds. No murmur heard. Pulmonary:     Effort: Pulmonary effort is normal.     Breath sounds: Normal air entry. No rhonchi or rales.  Abdominal:     General: Abdomen is flat. Bowel sounds are normal. There is no distension.     Palpations: Abdomen is soft. There is no hepatomegaly, splenomegaly or mass.     Tenderness: There is no abdominal tenderness.  Musculoskeletal:        General: Normal range of motion.     Cervical back: Normal range of motion and neck supple.     Right lower leg: No edema.     Left lower leg: No edema.  Skin:    General: Skin is warm and dry.  Neurological:     General: No focal deficit present.     Mental Status: He is alert and oriented to person, place, and time.     Cranial Nerves: No cranial nerve deficit.     Motor: No weakness.  Psychiatric:        Mood and Affect: Mood normal.        Behavior: Behavior normal.      No results found for any visits on  12/27/22.      Assessment & Plan:  As per problem list. Advised to try two trazodone tabs hs and if effective will adjust Rx. Problem List Items Addressed This Visit       Respiratory   Seasonal allergic rhinitis due to pollen   Relevant Medications   Azelastine HCl 137 MCG/SPRAY SOLN     Musculoskeletal and Integument   Osteoarthritis of lumbar spine   Relevant Medications   cyclobenzaprine (FLEXERIL) 5 MG tablet     Other   Insomnia   Relevant Medications   traZODone (DESYREL) 100 MG tablet    Return in about 3 months (around 03/27/2023) for fu with labs prior.   Total time spent: 30 minutes  Luna Fuse, MD  12/27/2022   This document may have been prepared by [facility] Voice Recognition software and as such may include unintentional dictation errors.

## 2023-01-17 ENCOUNTER — Other Ambulatory Visit: Payer: Self-pay | Admitting: Internal Medicine

## 2023-01-17 DIAGNOSIS — G4700 Insomnia, unspecified: Secondary | ICD-10-CM

## 2023-01-17 DIAGNOSIS — M47816 Spondylosis without myelopathy or radiculopathy, lumbar region: Secondary | ICD-10-CM

## 2023-01-28 ENCOUNTER — Other Ambulatory Visit: Payer: Self-pay | Admitting: Internal Medicine

## 2023-01-28 DIAGNOSIS — E785 Hyperlipidemia, unspecified: Secondary | ICD-10-CM

## 2023-02-09 ENCOUNTER — Other Ambulatory Visit: Payer: Self-pay | Admitting: Cardiovascular Disease

## 2023-02-12 ENCOUNTER — Other Ambulatory Visit: Payer: Self-pay | Admitting: Internal Medicine

## 2023-02-12 ENCOUNTER — Other Ambulatory Visit: Payer: Self-pay | Admitting: Cardiovascular Disease

## 2023-02-12 DIAGNOSIS — I10 Essential (primary) hypertension: Secondary | ICD-10-CM

## 2023-02-12 DIAGNOSIS — J301 Allergic rhinitis due to pollen: Secondary | ICD-10-CM

## 2023-03-01 ENCOUNTER — Other Ambulatory Visit: Payer: Self-pay | Admitting: Cardiovascular Disease

## 2023-03-02 ENCOUNTER — Ambulatory Visit: Payer: 59 | Admitting: Cardiovascular Disease

## 2023-03-09 ENCOUNTER — Ambulatory Visit: Payer: 59 | Admitting: Cardiovascular Disease

## 2023-03-09 ENCOUNTER — Encounter: Payer: Self-pay | Admitting: Cardiovascular Disease

## 2023-03-09 VITALS — BP 128/73 | HR 81 | Ht 64.0 in | Wt 184.4 lb

## 2023-03-09 DIAGNOSIS — N184 Chronic kidney disease, stage 4 (severe): Secondary | ICD-10-CM | POA: Diagnosis not present

## 2023-03-09 DIAGNOSIS — G4733 Obstructive sleep apnea (adult) (pediatric): Secondary | ICD-10-CM

## 2023-03-09 DIAGNOSIS — I340 Nonrheumatic mitral (valve) insufficiency: Secondary | ICD-10-CM | POA: Diagnosis not present

## 2023-03-09 DIAGNOSIS — I214 Non-ST elevation (NSTEMI) myocardial infarction: Secondary | ICD-10-CM | POA: Diagnosis not present

## 2023-03-09 DIAGNOSIS — I5033 Acute on chronic diastolic (congestive) heart failure: Secondary | ICD-10-CM

## 2023-03-09 DIAGNOSIS — N1832 Chronic kidney disease, stage 3b: Secondary | ICD-10-CM | POA: Diagnosis not present

## 2023-03-09 DIAGNOSIS — E785 Hyperlipidemia, unspecified: Secondary | ICD-10-CM | POA: Diagnosis not present

## 2023-03-09 DIAGNOSIS — I10 Essential (primary) hypertension: Secondary | ICD-10-CM

## 2023-03-09 DIAGNOSIS — N179 Acute kidney failure, unspecified: Secondary | ICD-10-CM

## 2023-03-09 DIAGNOSIS — I2510 Atherosclerotic heart disease of native coronary artery without angina pectoris: Secondary | ICD-10-CM

## 2023-03-09 DIAGNOSIS — R0602 Shortness of breath: Secondary | ICD-10-CM

## 2023-03-09 NOTE — Progress Notes (Signed)
Cardiology Office Note   Date:  03/09/2023   ID:  Gregory, [name] [birth year], MRN [id number]  PCP:  Sherron Monday, MD  Cardiologist:  Adrian Blackwater, MD      History of Present Illness: Gregory [name] is a 78 y.o. 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   Pneumothorax    Associated with pneumonia     Past Surgical History:  Procedure Laterality Date   Arm surgery     CARDIAC CATHETERIZATION N/A 12/05/2014   Procedure: Left Heart Cath and Coronary Angiography;  Surgeon: Lyn Records, MD;  Location: [facility] INVASIVE CV LAB;  Service: Cardiovascular;  Laterality: N/A;   CATARACT EXTRACTION W/PHACO Right 01/14/2013   Procedure: RIGHT EYE CATARACT EXTRACTION PHACO AND INTRAOCULAR LENS PLACEMENT ;  Surgeon: Gemma Payor, MD;  Location: [facility];  Service: Ophthalmology;  Laterality: Right;  CDE 54.97   CATARACT EXTRACTION W/PHACO Left 02/18/2013   Procedure: CATARACT EXTRACTION PHACO AND INTRAOCULAR LENS PLACEMENT (IOC);  Surgeon: Gemma Payor, MD;  Location: [facility];  Service: Ophthalmology;  Laterality: Left;  CDE 34.02   COLONOSCOPY WITH PROPOFOL N/A 07/13/2016   Procedure: COLONOSCOPY WITH PROPOFOL;  Surgeon: Scot Jun, MD;  Location: [facility] ENDOSCOPY;  Service: Endoscopy;  Laterality: N/A;   INGUINAL HERNIA REPAIR Right 09/09/2012   Procedure: HERNIA REPAIR INGUINAL INCARCERATED;  Surgeon: Kandis Cocking, MD;  Location: [facility];  Service: General;  Laterality: Right;   INSERTION OF MESH Right 09/09/2012   Procedure: INSERTION OF MESH;  Surgeon: Kandis Cocking, MD;  Location: [facility];  Service: General;  Laterality: Right;   LEFT HEART CATH AND CORONARY ANGIOGRAPHY Right 09/21/2021    Procedure: LEFT HEART CATH AND CORONARY ANGIOGRAPHY with intervention;  Surgeon: Laurier Nancy, MD;  Location: [facility] INVASIVE CV LAB;  Service: Cardiovascular;  Laterality: Right;     Current Outpatient Medications  Medication Sig Dispense Refill   acetaminophen (TYLENOL) 500 MG tablet Take 1,500 mg by mouth every 6 (six) hours as needed for moderate pain.     allopurinol (ZYLOPRIM) 100 MG tablet Take 1 tablet by mouth once daily 90 tablet 0   aspirin 81 MG chewable tablet Chew 1 tablet (81 mg total) by mouth daily.     Azelastine HCl 137 MCG/SPRAY SOLN Place 1 spray into the nose daily. 30 mL 2   benzonatate (TESSALON) 100 MG capsule Take 1 capsule by mouth three times daily as needed for cough 30 capsule 0   Budeson-Glycopyrrol-Formoterol (BREZTRI AEROSPHERE) 160-9-4.8 MCG/ACT AERO Inhale 2 puffs by mouth twice daily 11 g 3   clopidogrel (PLAVIX) 75 MG tablet Take 1 tablet by mouth once daily 90 tablet 0   Colchicine 0.6 MG CAPS TAKE 2 CAPSULES BY MOUTH FOR THE FIRST DOSE, THEN TAKE 1 CAPSULE AN HOUR LATER, THEN TAKE 1 CAPSULE DAILY TILL  PAIN  FREE. 30 capsule 3   cyclobenzaprine (FLEXERIL) 5 MG tablet TAKE 1 TABLET BY MOUTH ONCE DAILY AS NEEDED FOR MUSCLE SPASMS(BACK SPASMS). 30 tablet 2   dapagliflozin propanediol (FARXIGA) 5 MG TABS tablet Take 5 mg by  mouth daily.     EQ ALLERGY RELIEF, CETIRIZINE, 10 MG tablet TAKE 1 TABLET BY MOUTH ONCE DAILY IN THE MORNING 90 tablet 0   fluticasone (FLONASE) 50 MCG/ACT nasal spray Place 1 spray into both nostrils 2 (two) times daily.     furosemide (LASIX) 20 MG tablet Take 1 tablet by mouth once daily 90 tablet 0   isosorbide mononitrate (IMDUR) 30 MG 24 hr tablet Take 1 tablet by mouth once daily 90 tablet 0   lisinopril (ZESTRIL) 40 MG tablet Take 1 tablet by mouth once daily 90 tablet 0   metoprolol tartrate (LOPRESSOR) 25 MG tablet Take 1/2 (one-half) tablet by mouth twice daily 90 tablet 0   rosuvastatin (CRESTOR) 40 MG tablet TAKE 1 TABLET BY  MOUTH ONCE DAILY IN THE EVENING 90 tablet 0   sodium bicarbonate 650 MG tablet Take 1 tablet by mouth twice daily 100 tablet 1   spironolactone (ALDACTONE) 25 MG tablet Take 1 tablet by mouth once daily 30 tablet 0   traZODone (DESYREL) 100 MG tablet TAKE 1 TABLET BY MOUTH AT BEDTIME AS NEEDED FOR SLEEP 30 tablet 2   No current facility-administered medications for this visit.    Allergies:   Patient has no known allergies.    Social History:   reports that he quit smoking about 45 years ago. His smoking use included cigarettes. He has never used smokeless tobacco. He reports that he does not drink alcohol and does not use drugs.   Family History:  family history includes COPD in his father; Stroke in his brother and son.    ROS:     Review of Systems  Constitutional: Negative.   HENT: Negative.    Eyes: Negative.   Respiratory: Negative.    Gastrointestinal: Negative.   Genitourinary: Negative.   Musculoskeletal: Negative.   Skin: Negative.   Neurological: Negative.   Endo/Heme/Allergies: Negative.   Psychiatric/Behavioral: Negative.    All other systems reviewed and are negative.     All other systems are reviewed and negative.    PHYSICAL EXAM: VS:  BP 128/73   Pulse 81   Ht 5\' 4"  (1.626 m)   Wt 184 lb 6.4 oz (83.6 kg)   SpO2 98%   BMI 31.65 kg/m  , BMI Body mass index is 31.65 kg/m. Last weight:  Wt Readings from Last 3 Encounters:  03/09/23 184 lb 6.4 oz (83.6 kg)  12/27/22 179 lb 4.8 oz (81.3 kg)  11/29/22 182 lb (82.6 kg)     Physical Exam Vitals reviewed.  Constitutional:      Appearance: Normal appearance. He is normal weight.  HENT:     Head: Normocephalic.     Nose: Nose normal.     Mouth/Throat:     Mouth: Mucous membranes are moist.  Eyes:     Pupils: Pupils are equal, round, and reactive to light.  Cardiovascular:     Rate and Rhythm: Normal rate and regular rhythm.     Pulses: Normal pulses.     Heart sounds: Normal heart sounds.   Pulmonary:     Effort: Pulmonary effort is normal.  Abdominal:     General: Abdomen is flat. Bowel sounds are normal.  Musculoskeletal:        General: Normal range of motion.     Cervical back: Normal range of motion.  Skin:    General: Skin is warm.  Neurological:     General: No focal deficit present.     Mental Status:  He is alert.  Psychiatric:        Mood and Affect: Mood normal.       EKG:   Recent Labs: 09/29/2022: ALT 27 09/30/2022: BUN 18; Creatinine, Ser 1.74; Hemoglobin 13.3; Magnesium 2.3; Platelets 186; Sodium 136 10/27/2022: Potassium 4.2    Lipid Panel    Component Value Date/Time   CHOL 102 07/08/2022 1023   TRIG 183 (H) 07/08/2022 1023   HDL 37 (L) 07/08/2022 1023   CHOLHDL 3.9 12/05/2014 0709   VLDL 21 12/05/2014 0709   LDLCALC 35 07/08/2022 1023      Other studies Reviewed: Additional studies/ records that were reviewed today include:  Review of the above records demonstrates:       No data to display            ASSESSMENT AND PLAN:    ICD-10-CM   1. Obstructive sleep apnea syndrome  G47.33    cannot sleep as had CPAP in past but did not wear for 3 years and will have Dr. Kinnie Feil schedual sleep study. Only sleeps 2 hours at night    2. Essential hypertension  I10 PCV ECHOCARDIOGRAM COMPLETE    3. NSTEMI (non-ST elevated myocardial infarction) (HCC)  I21.4 PCV ECHOCARDIOGRAM COMPLETE    4. CAD, multiple vessel  I25.10 PCV ECHOCARDIOGRAM COMPLETE    5. CKD (chronic kidney disease) stage 4, GFR 15-29 ml/min (HCC)  N18.4 PCV ECHOCARDIOGRAM COMPLETE    6. Acute renal failure superimposed on stage 3b chronic kidney disease, unspecified acute renal failure type (HCC)  N17.9 PCV ECHOCARDIOGRAM COMPLETE   N18.32     7. SOB (shortness of breath)  R06.02 PCV ECHOCARDIOGRAM COMPLETE    8. Dyslipidemia  E78.5 PCV ECHOCARDIOGRAM COMPLETE    9. Nonrheumatic mitral valve regurgitation  I34.0 PCV ECHOCARDIOGRAM COMPLETE    10. CHF (congestive  heart failure), NYHA class III, acute on chronic, diastolic (HCC)  I50.33 PCV ECHOCARDIOGRAM COMPLETE   Needs echo as was not done in8/24       Problem List Items Addressed This Visit       Cardiovascular and Mediastinum   NSTEMI (non-ST elevated myocardial infarction) (HCC)   Relevant Orders   PCV ECHOCARDIOGRAM COMPLETE   CAD, multiple vessel   Relevant Orders   PCV ECHOCARDIOGRAM COMPLETE   Essential hypertension   Relevant Orders   PCV ECHOCARDIOGRAM COMPLETE   Nonrheumatic mitral valve regurgitation   Relevant Orders   PCV ECHOCARDIOGRAM COMPLETE     Genitourinary   CKD (chronic kidney disease) stage 4, GFR 15-29 ml/min (HCC)   Relevant Orders   PCV ECHOCARDIOGRAM COMPLETE   Acute renal failure superimposed on stage 3b chronic kidney disease, unspecified acute renal failure type (HCC)   Relevant Orders   PCV ECHOCARDIOGRAM COMPLETE     Other   Dyslipidemia   Relevant Orders   PCV ECHOCARDIOGRAM COMPLETE   SOB (shortness of breath)   Relevant Orders   PCV ECHOCARDIOGRAM COMPLETE   Other Visit Diagnoses       Obstructive sleep apnea syndrome    -  Primary   cannot sleep as had CPAP in past but did not wear for 3 years and will have Dr. Kinnie Feil schedual sleep study. Only sleeps 2 hours at night     CHF (congestive heart failure), NYHA class III, acute on chronic, diastolic (HCC)       Needs echo as was not done in8/24   Relevant Orders   PCV ECHOCARDIOGRAM COMPLETE  Disposition:   Return in about 6 weeks (around 04/20/2023) for echo and f/u.    Total time spent: 30 minutes  Signed,  Adrian Blackwater, MD  03/09/2023 2:07 PM    [facility]

## 2023-03-17 ENCOUNTER — Other Ambulatory Visit: Payer: Self-pay | Admitting: Internal Medicine

## 2023-03-17 DIAGNOSIS — J301 Allergic rhinitis due to pollen: Secondary | ICD-10-CM

## 2023-03-22 ENCOUNTER — Ambulatory Visit: Payer: 59

## 2023-03-22 DIAGNOSIS — R0602 Shortness of breath: Secondary | ICD-10-CM

## 2023-03-22 DIAGNOSIS — E785 Hyperlipidemia, unspecified: Secondary | ICD-10-CM

## 2023-03-22 DIAGNOSIS — I5033 Acute on chronic diastolic (congestive) heart failure: Secondary | ICD-10-CM

## 2023-03-22 DIAGNOSIS — I214 Non-ST elevation (NSTEMI) myocardial infarction: Secondary | ICD-10-CM

## 2023-03-22 DIAGNOSIS — I2510 Atherosclerotic heart disease of native coronary artery without angina pectoris: Secondary | ICD-10-CM

## 2023-03-22 DIAGNOSIS — I340 Nonrheumatic mitral (valve) insufficiency: Secondary | ICD-10-CM | POA: Diagnosis not present

## 2023-03-22 DIAGNOSIS — N184 Chronic kidney disease, stage 4 (severe): Secondary | ICD-10-CM

## 2023-03-22 DIAGNOSIS — N1832 Chronic kidney disease, stage 3b: Secondary | ICD-10-CM

## 2023-03-22 DIAGNOSIS — I10 Essential (primary) hypertension: Secondary | ICD-10-CM

## 2023-03-30 ENCOUNTER — Other Ambulatory Visit

## 2023-03-30 DIAGNOSIS — I10 Essential (primary) hypertension: Secondary | ICD-10-CM | POA: Diagnosis not present

## 2023-03-30 DIAGNOSIS — E785 Hyperlipidemia, unspecified: Secondary | ICD-10-CM | POA: Diagnosis not present

## 2023-03-31 ENCOUNTER — Encounter: Payer: Self-pay | Admitting: Internal Medicine

## 2023-03-31 ENCOUNTER — Ambulatory Visit: Payer: 59 | Admitting: Internal Medicine

## 2023-03-31 VITALS — BP 110/62 | HR 83 | Temp 98.3°F | Ht 64.0 in | Wt 188.0 lb

## 2023-03-31 DIAGNOSIS — R202 Paresthesia of skin: Secondary | ICD-10-CM

## 2023-03-31 DIAGNOSIS — M47816 Spondylosis without myelopathy or radiculopathy, lumbar region: Secondary | ICD-10-CM

## 2023-03-31 DIAGNOSIS — G4700 Insomnia, unspecified: Secondary | ICD-10-CM | POA: Diagnosis not present

## 2023-03-31 DIAGNOSIS — Z0130 Encounter for examination of blood pressure without abnormal findings: Secondary | ICD-10-CM

## 2023-03-31 DIAGNOSIS — J301 Allergic rhinitis due to pollen: Secondary | ICD-10-CM

## 2023-03-31 LAB — LIPID PANEL
Chol/HDL Ratio: 2.6 ratio (ref 0.0–5.0)
Cholesterol, Total: 109 mg/dL (ref 100–199)
HDL: 42 mg/dL (ref >39)
LDL Chol Calc (NIH): 48 mg/dL (ref 0–99)
Triglycerides: 101 mg/dL (ref 0–149)
VLDL Cholesterol Cal: 19 mg/dL (ref 5–40)

## 2023-03-31 LAB — CBC WITH DIFF/PLATELET
Basophils Absolute: 0.1 10*3/uL (ref 0.0–0.2)
Basos: 1 %
EOS (ABSOLUTE): 0.3 10*3/uL (ref 0.0–0.4)
Eos: 3 %
Hematocrit: 45.1 % (ref 37.5–51.0)
Hemoglobin: 15 g/dL (ref 13.0–17.7)
Immature Grans (Abs): 0 10*3/uL (ref 0.0–0.1)
Immature Granulocytes: 0 %
Lymphocytes Absolute: 3.8 10*3/uL — ABNORMAL HIGH (ref 0.7–3.1)
Lymphs: 34 %
MCH: 30.5 pg (ref 26.6–33.0)
MCHC: 33.3 g/dL (ref 31.5–35.7)
MCV: 92 fL (ref 79–97)
Monocytes Absolute: 1 10*3/uL — ABNORMAL HIGH (ref 0.1–0.9)
Monocytes: 9 %
Neutrophils Absolute: 6 10*3/uL (ref 1.4–7.0)
Neutrophils: 53 %
Platelets: 228 10*3/uL (ref 150–450)
RBC: 4.91 x10E6/uL (ref 4.14–5.80)
RDW: 14.1 % (ref 11.6–15.4)
WBC: 11.2 10*3/uL — ABNORMAL HIGH (ref 3.4–10.8)

## 2023-03-31 LAB — COMPREHENSIVE METABOLIC PANEL
ALT: 24 IU/L (ref 0–44)
AST: 34 IU/L (ref 0–40)
Albumin: 4 g/dL (ref 3.8–4.8)
Alkaline Phosphatase: 69 IU/L (ref 44–121)
BUN/Creatinine Ratio: 13 (ref 10–24)
BUN: 26 mg/dL (ref 8–27)
Bilirubin Total: 0.8 mg/dL (ref 0.0–1.2)
CO2: 20 mmol/L (ref 20–29)
Calcium: 10.1 mg/dL (ref 8.6–10.2)
Chloride: 102 mmol/L (ref 96–106)
Creatinine, Ser: 2.05 mg/dL — ABNORMAL HIGH (ref 0.76–1.27)
Globulin, Total: 2.6 g/dL (ref 1.5–4.5)
Glucose: 100 mg/dL — ABNORMAL HIGH (ref 70–99)
Potassium: 4.6 mmol/L (ref 3.5–5.2)
Sodium: 138 mmol/L (ref 134–144)
Total Protein: 6.6 g/dL (ref 6.0–8.5)
eGFR: 33 mL/min/{1.73_m2} — ABNORMAL LOW (ref >59)

## 2023-03-31 MED ORDER — AZELASTINE HCL 137 MCG/SPRAY NA SOLN: 1.0000 | Freq: Every day | NASAL | 2 refills | Status: DC

## 2023-03-31 MED ORDER — CYCLOBENZAPRINE HCL 5 MG PO TABS: 5.0000 mg | ORAL_TABLET | Freq: Three times a day (TID) | ORAL | 2 refills | Status: DC | PRN

## 2023-03-31 MED ORDER — GABAPENTIN 100 MG PO CAPS: 100.0000 mg | ORAL_CAPSULE | Freq: Three times a day (TID) | ORAL | 2 refills | Status: DC

## 2023-03-31 MED ORDER — TRAZODONE HCL 100 MG PO TABS: 100.0000 mg | ORAL_TABLET | Freq: Every evening | ORAL | 2 refills | Status: DC | PRN

## 2023-03-31 NOTE — Progress Notes (Signed)
 Established Patient Office Visit  Subjective:  Patient ID: Gregory [name], male    DOB: 11-11-1945  Age: 78 y.o. MRN: [id number]  Chief Complaint  Patient presents with   Follow-up    3 month follow up     C/o sharp pain in his right fingers x several months. Constant no known relieving factors and not related to activity. Also here for lab review and medication refills. LDL and TC well controlled on lab review. Triglycerides also satisfactory, CMP notable for stable CKD 4, while CBC notable for leukocytosis.     No other concerns at this time.   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   Pneumothorax    Associated with pneumonia    Past Surgical History:  Procedure Laterality Date   Arm surgery     CARDIAC CATHETERIZATION N/A 12/05/2014   Procedure: Left Heart Cath and Coronary Angiography;  Surgeon: Lyn Records, MD;  Location: [facility] INVASIVE CV LAB;  Service: Cardiovascular;  Laterality: N/A;   CATARACT EXTRACTION W/PHACO Right 01/14/2013   Procedure: RIGHT EYE CATARACT EXTRACTION PHACO AND INTRAOCULAR LENS PLACEMENT ;  Surgeon: Gemma Payor, MD;  Location: [facility];  Service: Ophthalmology;  Laterality: Right;  CDE 54.97   CATARACT EXTRACTION W/PHACO Left 02/18/2013   Procedure: CATARACT EXTRACTION PHACO AND INTRAOCULAR LENS PLACEMENT (IOC);  Surgeon: Gemma Payor, MD;  Location: [facility];  Service: Ophthalmology;  Laterality: Left;  CDE 34.02   COLONOSCOPY WITH PROPOFOL N/A 07/13/2016   Procedure: COLONOSCOPY WITH PROPOFOL;  Surgeon: Scot Jun, MD;  Location: [facility] ENDOSCOPY;  Service: Endoscopy;  Laterality: N/A;   INGUINAL HERNIA REPAIR Right 09/09/2012   Procedure: HERNIA REPAIR INGUINAL INCARCERATED;  Surgeon: Kandis Cocking, MD;  Location: [facility]S;  Service: General;  Laterality: Right;   INSERTION OF MESH Right 09/09/2012   Procedure: INSERTION OF MESH;  Surgeon: Kandis Cocking, MD;  Location: [facility];  Service: General;  Laterality: Right;   LEFT HEART CATH AND CORONARY ANGIOGRAPHY Right 09/21/2021   Procedure: LEFT HEART CATH AND CORONARY ANGIOGRAPHY with intervention;  Surgeon: Laurier Nancy, MD;  Location: [facility] INVASIVE CV LAB;  Service: Cardiovascular;  Laterality: Right;    Social History   Socioeconomic History   Marital status: Widowed    Spouse name: Not on file   Number of children: 2   Years of education: Not on file   Highest education level: Not on file  Occupational History   Not on file  Tobacco Use   Smoking status: Former    Current packs/day: 0.00    Types: Cigarettes    Quit date: 01/28/1978    Years since quitting: 45.2   Smokeless tobacco: Never  Vaping Use   Vaping status: Never Used  Substance and Sexual Activity   Alcohol use: No    Alcohol/week: 0.0 standard drinks of alcohol   Drug use: No   Sexual activity: Not on file  Other Topics Concern   Not on file  Social History Narrative   Wife passed approx. 4 years ago. Has 1 living daughter. Lives by himself now.    Social Drivers of Corporate investment banker Strain: Not on file  Food Insecurity: No Food Insecurity (09/29/2022)   Hunger Vital Sign    Worried About Running Out of Food in the Last Year: Never true    Ran  Out of Food in the Last Year: Never true  Transportation Needs: No Transportation Needs (09/29/2022)   PRAPARE - Administrator, Civil Service (Medical): No    Lack of Transportation (Non-Medical): No  Physical Activity: Not on file  Stress: Not on file  Social Connections: Not on file  Intimate Partner Violence: Not At Risk (09/29/2022)   Humiliation, Afraid, Rape, and Kick questionnaire    Fear of Current or Ex-Partner: No    Emotionally Abused: No    Physically Abused: No    Sexually Abused: No    Family History  Problem Relation Age of Onset   COPD Father    Stroke Brother    Stroke Son     No Known Allergies  Outpatient  Medications Prior to Visit  Medication Sig   acetaminophen (TYLENOL) 500 MG tablet Take 1,500 mg by mouth every 6 (six) hours as needed for moderate pain.   allopurinol (ZYLOPRIM) 100 MG tablet Take 1 tablet by mouth once daily   aspirin 81 MG chewable tablet Chew 1 tablet (81 mg total) by mouth daily.   Azelastine HCl 137 MCG/SPRAY SOLN Place 1 spray into the nose daily.   benzonatate (TESSALON) 100 MG capsule Take 1 capsule by mouth three times daily as needed for cough   Budeson-Glycopyrrol-Formoterol (BREZTRI AEROSPHERE) 160-9-4.8 MCG/ACT AERO Inhale 2 puffs by mouth twice daily   clopidogrel (PLAVIX) 75 MG tablet Take 1 tablet by mouth once daily   Colchicine 0.6 MG CAPS TAKE 2 CAPSULES BY MOUTH FOR THE FIRST DOSE, THEN TAKE 1 CAPSULE AN HOUR LATER, THEN TAKE 1 CAPSULE DAILY TILL  PAIN  FREE.   cyclobenzaprine (FLEXERIL) 5 MG tablet TAKE 1 TABLET BY MOUTH ONCE DAILY AS NEEDED FOR MUSCLE SPASMS(BACK SPASMS).   dapagliflozin propanediol (FARXIGA) 5 MG TABS tablet Take 5 mg by mouth daily.   EQ ALLERGY RELIEF, CETIRIZINE, 10 MG tablet TAKE 1 TABLET BY MOUTH ONCE DAILY IN THE MORNING   fluticasone (FLONASE) 50 MCG/ACT nasal spray Place 1 spray into both nostrils 2 (two) times daily.   furosemide (LASIX) 20 MG tablet Take 1 tablet by mouth once daily   isosorbide mononitrate (IMDUR) 30 MG 24 hr tablet Take 1 tablet by mouth once daily   lisinopril (ZESTRIL) 40 MG tablet Take 1 tablet by mouth once daily   metoprolol tartrate (LOPRESSOR) 25 MG tablet Take 1/2 (one-half) tablet by mouth twice daily   rosuvastatin (CRESTOR) 40 MG tablet TAKE 1 TABLET BY MOUTH ONCE DAILY IN THE EVENING   sodium bicarbonate 650 MG tablet Take 1 tablet by mouth twice daily   spironolactone (ALDACTONE) 25 MG tablet Take 1 tablet by mouth once daily   traZODone (DESYREL) 100 MG tablet TAKE 1 TABLET BY MOUTH AT BEDTIME AS NEEDED FOR SLEEP   No facility-administered medications prior to visit.    Review of Systems   Constitutional: Negative.   HENT: Negative.    Eyes: Negative.   Respiratory: Negative.    Cardiovascular: Negative.   Gastrointestinal: Negative.   Genitourinary:  Positive for frequency. Negative for urgency.  Musculoskeletal:  Positive for back pain and joint pain.  Skin: Negative.   Neurological: Negative.   Endo/Heme/Allergies: Negative.        Objective:   BP 110/62   Pulse 83   Temp 98.3 F (36.8 C)   Ht 5\' 4"  (1.626 m)   Wt 188 lb (85.3 kg)   SpO2 94%   BMI 32.27 kg/m   Vitals:  03/31/23 1348  BP: 110/62  Pulse: 83  Temp: 98.3 F (36.8 C)  Height: 5\' 4"  (1.626 m)  Weight: 188 lb (85.3 kg)  SpO2: 94%  BMI (Calculated): 32.25    Physical Exam   No results found for any visits on 03/31/23.  Recent Results (from the past 2160 hours)  CBC With Diff/Platelet     Status: Abnormal   Collection Time: 03/30/23 10:03 AM  Result Value Ref Range   WBC 11.2 (H) 3.4 - 10.8 x10E3/uL   RBC 4.91 4.14 - 5.80 x10E6/uL   Hemoglobin 15.0 13.0 - 17.7 g/dL   Hematocrit 16.1 09.6 - 51.0 %   MCV 92 79 - 97 fL   MCH 30.5 26.6 - 33.0 pg   MCHC 33.3 31.5 - 35.7 g/dL   RDW 04.5 40.9 - 81.1 %   Platelets 228 150 - 450 x10E3/uL   Neutrophils 53 Not Estab. %   Lymphs 34 Not Estab. %   Monocytes 9 Not Estab. %   Eos 3 Not Estab. %   Basos 1 Not Estab. %   Neutrophils Absolute 6.0 1.4 - 7.0 x10E3/uL   Lymphocytes Absolute 3.8 (H) 0.7 - 3.1 x10E3/uL   Monocytes Absolute 1.0 (H) 0.1 - 0.9 x10E3/uL   EOS (ABSOLUTE) 0.3 0.0 - 0.4 x10E3/uL   Basophils Absolute 0.1 0.0 - 0.2 x10E3/uL   Immature Granulocytes 0 Not Estab. %   Immature Grans (Abs) 0.0 0.0 - 0.1 x10E3/uL  Comprehensive metabolic panel     Status: Abnormal   Collection Time: 03/30/23 10:03 AM  Result Value Ref Range   Glucose 100 (H) 70 - 99 mg/dL   BUN 26 8 - 27 mg/dL   Creatinine, Ser 9.14 (H) 0.76 - 1.27 mg/dL   eGFR 33 (L) >78 GN/FAO/1.30   BUN/Creatinine Ratio 13 10 - 24   Sodium 138 134 - 144 mmol/L    Potassium 4.6 3.5 - 5.2 mmol/L   Chloride 102 96 - 106 mmol/L   CO2 20 20 - 29 mmol/L   Calcium 10.1 8.6 - 10.2 mg/dL   Total Protein 6.6 6.0 - 8.5 g/dL   Albumin 4.0 3.8 - 4.8 g/dL   Globulin, Total 2.6 1.5 - 4.5 g/dL   Bilirubin Total 0.8 0.0 - 1.2 mg/dL   Alkaline Phosphatase 69 44 - 121 IU/L   AST 34 0 - 40 IU/L   ALT 24 0 - 44 IU/L  Lipid panel     Status: None   Collection Time: 03/30/23 10:03 AM  Result Value Ref Range   Cholesterol, Total 109 100 - 199 mg/dL   Triglycerides 865 0 - 149 mg/dL   HDL 42 >78 mg/dL   VLDL Cholesterol Cal 19 5 - 40 mg/dL   LDL Chol Calc (NIH) 48 0 - 99 mg/dL   Chol/HDL Ratio 2.6 0.0 - 5.0 ratio    Comment:                                   T. Chol/HDL Ratio                                             Men  Women  1/2 Avg.Risk  3.4    3.3                                   Avg.Risk  5.0    4.4                                2X Avg.Risk  9.6    7.1                                3X Avg.Risk 23.4   11.0       Assessment & Plan:  As per problem list  Problem List Items Addressed This Visit   None Visit Diagnoses       Right hand paresthesia    -  Primary   Relevant Medications   gabapentin (NEURONTIN) 100 MG capsule   Other Relevant Orders   DG Cervical Spine Complete       Return in about 7 weeks (around 05/19/2023) for R hand pain f/u.   Total time spent: 20 minutes  Luna Fuse, MD  03/31/2023   This document may have been prepared by [facility] Voice Recognition software and as such may include unintentional dictation errors.

## 2023-04-04 ENCOUNTER — Ambulatory Visit (INDEPENDENT_AMBULATORY_CARE_PROVIDER_SITE_OTHER)

## 2023-04-04 DIAGNOSIS — R202 Paresthesia of skin: Secondary | ICD-10-CM | POA: Diagnosis not present

## 2023-04-07 ENCOUNTER — Other Ambulatory Visit: Payer: Self-pay | Admitting: Cardiovascular Disease

## 2023-04-20 ENCOUNTER — Ambulatory Visit (INDEPENDENT_AMBULATORY_CARE_PROVIDER_SITE_OTHER): Payer: 59 | Admitting: Cardiovascular Disease

## 2023-04-20 ENCOUNTER — Encounter: Payer: Self-pay | Admitting: Cardiovascular Disease

## 2023-04-20 VITALS — BP 102/68 | HR 79 | Ht 64.0 in | Wt 186.0 lb

## 2023-04-20 DIAGNOSIS — E785 Hyperlipidemia, unspecified: Secondary | ICD-10-CM

## 2023-04-20 DIAGNOSIS — I340 Nonrheumatic mitral (valve) insufficiency: Secondary | ICD-10-CM | POA: Diagnosis not present

## 2023-04-20 DIAGNOSIS — R0602 Shortness of breath: Secondary | ICD-10-CM

## 2023-04-20 DIAGNOSIS — I10 Essential (primary) hypertension: Secondary | ICD-10-CM | POA: Diagnosis not present

## 2023-04-20 DIAGNOSIS — N184 Chronic kidney disease, stage 4 (severe): Secondary | ICD-10-CM | POA: Diagnosis not present

## 2023-04-20 DIAGNOSIS — I214 Non-ST elevation (NSTEMI) myocardial infarction: Secondary | ICD-10-CM

## 2023-04-20 NOTE — Progress Notes (Signed)
 Cardiology Office Note   Date:  04/20/2023   ID:  Gregory [name], Gregory [name] [birth year], MRN [id number]  PCP:  Sherron Monday, MD  Cardiologist:  Adrian Blackwater, MD      History of Present Illness: Gregory [name] is a 78 y.o. male who presents for  Chief Complaint  Patient presents with   Follow-up    6 week follow up ECHO results    Has DOE, when sits down he gets better. Also no SOB at rest.      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   Pneumothorax    Associated with pneumonia     Past Surgical History:  Procedure Laterality Date   Arm surgery     CARDIAC CATHETERIZATION N/A 12/05/2014   Procedure: Left Heart Cath and Coronary Angiography;  Surgeon: Lyn Records, MD;  Location: [facility] INVASIVE CV LAB;  Service: Cardiovascular;  Laterality: N/A;   CATARACT EXTRACTION W/PHACO Right 01/14/2013   Procedure: RIGHT EYE CATARACT EXTRACTION PHACO AND INTRAOCULAR LENS PLACEMENT ;  Surgeon: Gemma Payor, MD;  Location: [facility];  Service: Ophthalmology;  Laterality: Right;  CDE 54.97   CATARACT EXTRACTION W/PHACO Left 02/18/2013   Procedure: CATARACT EXTRACTION PHACO AND INTRAOCULAR LENS PLACEMENT (IOC);  Surgeon: Gemma Payor, MD;  Location: [facility];  Service: Ophthalmology;  Laterality: Left;  CDE 34.02   COLONOSCOPY WITH PROPOFOL N/A 07/13/2016   Procedure: COLONOSCOPY WITH PROPOFOL;  Surgeon: Scot Jun, MD;  Location: [facility] ENDOSCOPY;  Service: Endoscopy;  Laterality: N/A;   INGUINAL HERNIA REPAIR Right 09/09/2012   Procedure: HERNIA REPAIR INGUINAL INCARCERATED;  Surgeon: Kandis Cocking, MD;  Location: [facility];  Service: General;  Laterality: Right;   INSERTION OF MESH Right 09/09/2012   Procedure: INSERTION OF MESH;  Surgeon: Kandis Cocking, MD;  Location: [facility];  Service: General;  Laterality: Right;   LEFT HEART CATH AND CORONARY ANGIOGRAPHY Right 09/21/2021   Procedure: LEFT  HEART CATH AND CORONARY ANGIOGRAPHY with intervention;  Surgeon: Laurier Nancy, MD;  Location: [facility] INVASIVE CV LAB;  Service: Cardiovascular;  Laterality: Right;     Current Outpatient Medications  Medication Sig Dispense Refill   acetaminophen (TYLENOL) 500 MG tablet Take 1,500 mg by mouth every 6 (six) hours as needed for moderate pain.     allopurinol (ZYLOPRIM) 100 MG tablet Take 1 tablet by mouth once daily 90 tablet 0   aspirin 81 MG chewable tablet Chew 1 tablet (81 mg total) by mouth daily.     Azelastine HCl 137 MCG/SPRAY SOLN Place 1 spray into the nose daily. 30 mL 2   benzonatate (TESSALON) 100 MG capsule Take 1 capsule by mouth three times daily as needed for cough 30 capsule 0   Budeson-Glycopyrrol-Formoterol (BREZTRI AEROSPHERE) 160-9-4.8 MCG/ACT AERO Inhale 2 puffs by mouth twice daily 11 g 3   clopidogrel (PLAVIX) 75 MG tablet Take 1 tablet by mouth once daily 90 tablet 0   Colchicine 0.6 MG CAPS TAKE 2 CAPSULES BY MOUTH FOR THE FIRST DOSE, THEN TAKE 1 CAPSULE AN HOUR LATER, THEN TAKE 1 CAPSULE DAILY TILL  PAIN  FREE. 30 capsule 3   cyclobenzaprine (FLEXERIL) 5 MG tablet Take 1 tablet (5 mg total) by mouth 3 (three) times daily as needed for muscle spasms. 30 tablet 2   dapagliflozin propanediol (FARXIGA) 5 MG TABS tablet Take 5  mg by mouth daily.     EQ ALLERGY RELIEF, CETIRIZINE, 10 MG tablet TAKE 1 TABLET BY MOUTH ONCE DAILY IN THE MORNING 90 tablet 0   fluticasone (FLONASE) 50 MCG/ACT nasal spray Place 1 spray into both nostrils 2 (two) times daily.     furosemide (LASIX) 20 MG tablet Take 1 tablet by mouth once daily 90 tablet 0   gabapentin (NEURONTIN) 100 MG capsule Take 1 capsule (100 mg total) by mouth 3 (three) times daily. 90 capsule 2   isosorbide mononitrate (IMDUR) 30 MG 24 hr tablet Take 1 tablet by mouth once daily 90 tablet 0   lisinopril (ZESTRIL) 40 MG tablet Take 1 tablet by mouth once daily 90 tablet 0   metoprolol tartrate (LOPRESSOR) 25 MG tablet Take  1/2 (one-half) tablet by mouth twice daily 90 tablet 0   rosuvastatin (CRESTOR) 40 MG tablet TAKE 1 TABLET BY MOUTH ONCE DAILY IN THE EVENING 90 tablet 0   sodium bicarbonate 650 MG tablet Take 1 tablet by mouth twice daily 100 tablet 1   spironolactone (ALDACTONE) 25 MG tablet Take 1 tablet by mouth once daily 30 tablet 0   traZODone (DESYREL) 100 MG tablet Take 1 tablet (100 mg total) by mouth at bedtime as needed. for sleep 30 tablet 2   No current facility-administered medications for this visit.    Allergies:   Patient has no known allergies.    Social History:   reports that he quit smoking about 45 years ago. His smoking use included cigarettes. He has never used smokeless tobacco. He reports that he does not drink alcohol and does not use drugs.   Family History:  family history includes COPD in his father; Stroke in his brother and son.    ROS:     Review of Systems  Constitutional: Negative.   HENT: Negative.    Eyes: Negative.   Respiratory: Negative.    Gastrointestinal: Negative.   Genitourinary: Negative.   Musculoskeletal: Negative.   Skin: Negative.   Neurological: Negative.   Endo/Heme/Allergies: Negative.   Psychiatric/Behavioral: Negative.    All other systems reviewed and are negative.     All other systems are reviewed and negative.    PHYSICAL EXAM: VS:  BP 102/68   Pulse 79   Ht 5\' 4"  (1.626 m)   Wt 186 lb (84.4 kg)   SpO2 98%   BMI 31.93 kg/m  , BMI Body mass index is 31.93 kg/m. Last weight:  Wt Readings from Last 3 Encounters:  04/20/23 186 lb (84.4 kg)  03/31/23 188 lb (85.3 kg)  03/09/23 184 lb 6.4 oz (83.6 kg)     Physical Exam Vitals reviewed.  Constitutional:      Appearance: Normal appearance. He is normal weight.  HENT:     Head: Normocephalic.     Nose: Nose normal.     Mouth/Throat:     Mouth: Mucous membranes are moist.  Eyes:     Pupils: Pupils are equal, round, and reactive to light.  Cardiovascular:     Rate and  Rhythm: Normal rate and regular rhythm.     Pulses: Normal pulses.     Heart sounds: Normal heart sounds.  Pulmonary:     Effort: Pulmonary effort is normal.  Abdominal:     General: Abdomen is flat. Bowel sounds are normal.  Musculoskeletal:        General: Normal range of motion.     Cervical back: Normal range of motion.  Skin:  General: Skin is warm.  Neurological:     General: No focal deficit present.     Mental Status: He is alert.  Psychiatric:        Mood and Affect: Mood normal.       EKG:   Recent Labs: 09/30/2022: Magnesium 2.3 03/30/2023: ALT 24; BUN 26; Creatinine, Ser 2.05; Hemoglobin 15.0; Platelets 228; Potassium 4.6; Sodium 138    Lipid Panel    Component Value Date/Time   CHOL 109 03/30/2023 1003   TRIG 101 03/30/2023 1003   HDL 42 03/30/2023 1003   CHOLHDL 2.6 03/30/2023 1003   CHOLHDL 3.9 12/05/2014 0709   VLDL 21 12/05/2014 0709   LDLCALC 48 03/30/2023 1003      Other studies Reviewed: Additional studies/ records that were reviewed today include:  Review of the above records demonstrates:       No data to display            ASSESSMENT AND PLAN:    ICD-10-CM   1. SOB (shortness of breath)  R06.02    ECHO has distolic dysfunction.  Already on farxiga/aldactone. creat now 2, but feels better.    2. CKD (chronic kidney disease) stage 4, GFR 15-29 ml/min (HCC)  N18.4     3. NSTEMI (non-ST elevated myocardial infarction) (HCC)  I21.4     4. Essential hypertension  I10     5. Dyslipidemia  E78.5     6. Nonrheumatic mitral valve regurgitation  I34.0    mild Normal LVEF, grade 1 diastolic dysfunction on echo done 2/25       Problem List Items Addressed This Visit       Cardiovascular and Mediastinum   NSTEMI (non-ST elevated myocardial infarction) [facility])   Essential hypertension   Nonrheumatic mitral valve regurgitation     Genitourinary   CKD (chronic kidney disease) stage 4, GFR 15-29 ml/min (HCC)     Other    Dyslipidemia   SOB (shortness of breath) - Primary       Disposition:   Return in about 3 months (around 07/21/2023).    Total time spent: 30 minutes  Signed,  Adrian Blackwater, MD  04/20/2023 2:28 PM    [facility]

## 2023-04-23 ENCOUNTER — Other Ambulatory Visit: Payer: Self-pay | Admitting: Internal Medicine

## 2023-04-26 ENCOUNTER — Other Ambulatory Visit: Payer: Self-pay | Admitting: Internal Medicine

## 2023-04-26 DIAGNOSIS — E785 Hyperlipidemia, unspecified: Secondary | ICD-10-CM

## 2023-05-08 ENCOUNTER — Other Ambulatory Visit: Payer: Self-pay | Admitting: Internal Medicine

## 2023-05-19 ENCOUNTER — Encounter: Payer: Self-pay | Admitting: Internal Medicine

## 2023-05-19 ENCOUNTER — Ambulatory Visit: Admitting: Internal Medicine

## 2023-05-19 VITALS — BP 110/72 | HR 78 | Temp 98.5°F | Ht 64.0 in | Wt 188.2 lb

## 2023-05-19 DIAGNOSIS — Z0130 Encounter for examination of blood pressure without abnormal findings: Secondary | ICD-10-CM

## 2023-05-19 DIAGNOSIS — G5601 Carpal tunnel syndrome, right upper limb: Secondary | ICD-10-CM | POA: Diagnosis not present

## 2023-05-19 DIAGNOSIS — R202 Paresthesia of skin: Secondary | ICD-10-CM | POA: Diagnosis not present

## 2023-05-19 DIAGNOSIS — M47816 Spondylosis without myelopathy or radiculopathy, lumbar region: Secondary | ICD-10-CM | POA: Diagnosis not present

## 2023-05-19 MED ORDER — GABAPENTIN 300 MG PO CAPS: 300.0000 mg | ORAL_CAPSULE | Freq: Three times a day (TID) | ORAL | 2 refills | Status: DC

## 2023-05-19 NOTE — Progress Notes (Signed)
 Established Patient Office Visit  Subjective:  Patient ID: Gregory [name], male    DOB: [birth year]  Age: 78 y.o. MRN: [id number]  Chief Complaint  Patient presents with   Follow-up    7 week right hand pain follow up    Still c/o right hand paresthesia and weakness partially relieved by gabapentin .    No other concerns at this time.   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   Pneumothorax    Associated with pneumonia    Past Surgical History:  Procedure Laterality Date   Arm surgery     CARDIAC CATHETERIZATION N/A 12/05/2014   Procedure: Left Heart Cath and Coronary Angiography;  Surgeon: Arty Binning, MD;  Location: [facility] INVASIVE CV LAB;  Service: Cardiovascular;  Laterality: N/A;   CATARACT EXTRACTION W/PHACO Right 01/14/2013   Procedure: RIGHT EYE CATARACT EXTRACTION PHACO AND INTRAOCULAR LENS PLACEMENT ;  Surgeon: Anner Kill, MD;  Location: [facility] ORS;  Service: Ophthalmology;  Laterality: Right;  CDE 54.97   CATARACT EXTRACTION W/PHACO Left 02/18/2013   Procedure: CATARACT EXTRACTION PHACO AND INTRAOCULAR LENS PLACEMENT (IOC);  Surgeon: Anner Kill, MD;  Location: [facility] ORS;  Service: Ophthalmology;  Laterality: Left;  CDE 34.02   COLONOSCOPY WITH PROPOFOL  N/A 07/13/2016   Procedure: COLONOSCOPY WITH PROPOFOL ;  Surgeon: Cassie Click, MD;  Location: [facility] ENDOSCOPY;  Service: Endoscopy;  Laterality: N/A;   INGUINAL HERNIA REPAIR Right 09/09/2012   Procedure: HERNIA REPAIR INGUINAL INCARCERATED;  Surgeon: Thayne Fine, MD;  Location: [facility] ORS;  Service: General;  Laterality: Right;   INSERTION OF MESH Right 09/09/2012   Procedure: INSERTION OF MESH;  Surgeon: Thayne Fine, MD;  Location: [facility] ORS;  Service: General;  Laterality: Right;   LEFT HEART CATH AND CORONARY ANGIOGRAPHY Right 09/21/2021   Procedure: LEFT HEART CATH AND CORONARY ANGIOGRAPHY with intervention;  Surgeon: Cherrie Cornwall, MD;  Location: [facility] INVASIVE CV LAB;  Service: Cardiovascular;  Laterality: Right;    Social History   Socioeconomic History   Marital status: Widowed    Spouse name: Not on file   Number of children: 2   Years of education: Not on file   Highest education level: Not on file  Occupational History   Not on file  Tobacco Use   Smoking status: Former    Current packs/day: 0.00    Types: Cigarettes    Quit date: 01/28/1978    Years since quitting: 45.3   Smokeless tobacco: Never  Vaping Use   Vaping status: Never Used  Substance and Sexual Activity   Alcohol use: No    Alcohol/week: 0.0 standard drinks of alcohol   Drug use: No   Sexual activity: Not on file  Other Topics Concern   Not on file  Social History Narrative   Wife passed approx. 4 years ago. Has 1 living daughter. Lives by himself now.    Social Drivers of Corporate investment banker Strain: Not on file  Food Insecurity: No Food Insecurity (09/29/2022)   Hunger Vital Sign    Worried About Running Out of Food in the Last Year: Never true    Ran Out of Food in the Last Year: Never true  Transportation Needs: No Transportation Needs (09/29/2022)   PRAPARE - Administrator, Civil Service (Medical): No    Lack of Transportation (Non-Medical): No  Physical Activity: Not on file  Stress: Not on file  Social Connections: Not on file  Intimate Partner Violence: Not At Risk (09/29/2022)   Humiliation, Afraid, Rape, and Kick questionnaire    Fear of Current or Ex-Partner: No    Emotionally Abused: No    Physically Abused: No    Sexually Abused: No    Family History  Problem Relation Age of Onset   COPD Father    Stroke Brother    Stroke Son     No Known Allergies  Outpatient Medications Prior to Visit  Medication Sig   acetaminophen  (TYLENOL ) 500 MG tablet Take 1,500 mg by mouth every 6 (six) hours as needed for moderate pain.   allopurinol  (ZYLOPRIM ) 100 MG tablet Take 1 tablet by mouth  once daily   aspirin  81 MG chewable tablet Chew 1 tablet (81 mg total) by mouth daily.   Azelastine  HCl 137 MCG/SPRAY SOLN Place 1 spray into the nose daily.   BREZTRI AEROSPHERE 160-9-4.8 MCG/ACT AERO INHALE 2 PUFFS INTO LUNGS TWICE DAILY   clopidogrel  (PLAVIX ) 75 MG tablet Take 1 tablet by mouth once daily   Colchicine 0.6 MG CAPS TAKE 2 CAPSULES BY MOUTH FOR THE FIRST DOSE. THEN TAKE 1 CAPSULE AN HOUR LATER, THEN TAKE 1 CAPSULE DAILY TIL PAIN FREE   cyclobenzaprine  (FLEXERIL ) 5 MG tablet Take 1 tablet (5 mg total) by mouth 3 (three) times daily as needed for muscle spasms.   dapagliflozin propanediol (FARXIGA) 5 MG TABS tablet Take 5 mg by mouth daily.   EQ ALLERGY RELIEF, CETIRIZINE, 10 MG tablet TAKE 1 TABLET BY MOUTH ONCE DAILY IN THE MORNING   fluticasone  (FLONASE) 50 MCG/ACT nasal spray Place 1 spray into both nostrils 2 (two) times daily.   furosemide (LASIX) 20 MG tablet Take 1 tablet by mouth once daily   isosorbide  mononitrate (IMDUR ) 30 MG 24 hr tablet Take 1 tablet by mouth once daily   lisinopril  (ZESTRIL ) 40 MG tablet Take 1 tablet by mouth once daily   metoprolol  tartrate (LOPRESSOR ) 25 MG tablet Take 1/2 (one-half) tablet by mouth twice daily   rosuvastatin  (CRESTOR ) 40 MG tablet TAKE 1 TABLET BY MOUTH ONCE DAILY IN THE EVENING   sodium bicarbonate  650 MG tablet Take 1 tablet by mouth twice daily   spironolactone  (ALDACTONE ) 25 MG tablet Take 1 tablet by mouth once daily   traZODone  (DESYREL ) 100 MG tablet Take 1 tablet (100 mg total) by mouth at bedtime as needed. for sleep   [DISCONTINUED] gabapentin  (NEURONTIN ) 100 MG capsule Take 1 capsule (100 mg total) by mouth 3 (three) times daily.   benzonatate  (TESSALON ) 100 MG capsule Take 1 capsule by mouth three times daily as needed for cough (Patient not taking: Reported on 05/19/2023)   No facility-administered medications prior to visit.    Review of Systems  Constitutional: Negative.   HENT: Negative.    Eyes: Negative.    Respiratory: Negative.    Cardiovascular: Negative.   Gastrointestinal: Negative.   Genitourinary:  Positive for frequency. Negative for urgency.  Musculoskeletal:  Positive for back pain and joint pain.  Skin: Negative.   Neurological: Negative.   Endo/Heme/Allergies: Negative.        Objective:   BP 110/72   Pulse 78   Temp 98.5 F (36.9 C)   Ht 5\' 4"  (1.626 m)   Wt 188 lb 3.2 oz (85.4 kg)   SpO2 98%   BMI 32.30 kg/m   Vitals:   05/19/23 1436  BP:  110/72  Pulse: 78  Temp: 98.5 F (36.9 C)  Height: 5\' 4"  (1.626 m)  Weight: 188 lb 3.2 oz (85.4 kg)  SpO2: 98%  BMI (Calculated): 32.29    Physical Exam Vitals reviewed.  Constitutional:      Appearance: Normal appearance.  HENT:     Head: Normocephalic.     Left Ear: There is no impacted cerumen.     Nose: Nose normal.     Mouth/Throat:     Mouth: Mucous membranes are moist.     Pharynx: No posterior oropharyngeal erythema.  Eyes:     Extraocular Movements: Extraocular movements intact.     Pupils: Pupils are equal, round, and reactive to light.  Cardiovascular:     Rate and Rhythm: Regular rhythm.     Chest Wall: PMI is not displaced.     Pulses: Normal pulses.     Heart sounds: Normal heart sounds. No murmur heard. Pulmonary:     Effort: Pulmonary effort is normal.     Breath sounds: Normal air entry. No rhonchi or rales.  Abdominal:     General: Abdomen is flat. Bowel sounds are normal. There is no distension.     Palpations: Abdomen is soft. There is no hepatomegaly, splenomegaly or mass.     Tenderness: There is no abdominal tenderness.  Musculoskeletal:        General: Normal range of motion.     Cervical back: Normal range of motion and neck supple.     Right lower leg: No edema.     Left lower leg: No edema.  Skin:    General: Skin is warm and dry.  Neurological:     General: No focal deficit present.     Mental Status: He is alert and oriented to person, place, and time.     Cranial  Nerves: No cranial nerve deficit.     Sensory: Sensory deficit present.     Motor: No weakness.  Psychiatric:        Mood and Affect: Mood normal.        Behavior: Behavior normal.      No results found for any visits on 05/19/23.  Recent Results (from the past 2160 hours)  CBC With Diff/Platelet     Status: Abnormal   Collection Time: 03/30/23 10:03 AM  Result Value Ref Range   WBC 11.2 (H) 3.4 - 10.8 x10E3/uL   RBC 4.91 4.14 - 5.80 x10E6/uL   Hemoglobin 15.0 13.0 - 17.7 g/dL   Hematocrit 40.9 81.1 - 51.0 %   MCV 92 79 - 97 fL   MCH 30.5 26.6 - 33.0 pg   MCHC 33.3 31.5 - 35.7 g/dL   RDW 91.4 78.2 - 95.6 %   Platelets 228 150 - 450 x10E3/uL   Neutrophils 53 Not Estab. %   Lymphs 34 Not Estab. %   Monocytes 9 Not Estab. %   Eos 3 Not Estab. %   Basos 1 Not Estab. %   Neutrophils Absolute 6.0 1.4 - 7.0 x10E3/uL   Lymphocytes Absolute 3.8 (H) 0.7 - 3.1 x10E3/uL   Monocytes Absolute 1.0 (H) 0.1 - 0.9 x10E3/uL   EOS (ABSOLUTE) 0.3 0.0 - 0.4 x10E3/uL   Basophils Absolute 0.1 0.0 - 0.2 x10E3/uL   Immature Granulocytes 0 Not Estab. %   Immature Grans (Abs) 0.0 0.0 - 0.1 x10E3/uL  Comprehensive metabolic panel     Status: Abnormal   Collection Time: 03/30/23 10:03 AM  Result Value Ref Range  Glucose 100 (H) 70 - 99 mg/dL   BUN 26 8 - 27 mg/dL   Creatinine, Ser 1.19 (H) 0.76 - 1.27 mg/dL   eGFR 33 (L) >14 NW/GNF/6.21   BUN/Creatinine Ratio 13 10 - 24   Sodium 138 134 - 144 mmol/L   Potassium 4.6 3.5 - 5.2 mmol/L   Chloride 102 96 - 106 mmol/L   CO2 20 20 - 29 mmol/L   Calcium  10.1 8.6 - 10.2 mg/dL   Total Protein 6.6 6.0 - 8.5 g/dL   Albumin 4.0 3.8 - 4.8 g/dL   Globulin, Total 2.6 1.5 - 4.5 g/dL   Bilirubin Total 0.8 0.0 - 1.2 mg/dL   Alkaline Phosphatase 69 44 - 121 IU/L   AST 34 0 - 40 IU/L   ALT 24 0 - 44 IU/L  Lipid panel     Status: None   Collection Time: 03/30/23 10:03 AM  Result Value Ref Range   Cholesterol, Total 109 100 - 199 mg/dL   Triglycerides 308 0  - 149 mg/dL   HDL 42 >65 mg/dL   VLDL Cholesterol Cal 19 5 - 40 mg/dL   LDL Chol Calc (NIH) 48 0 - 99 mg/dL   Chol/HDL Ratio 2.6 0.0 - 5.0 ratio    Comment:                                   T. Chol/HDL Ratio                                             Men  Women                               1/2 Avg.Risk  3.4    3.3                                   Avg.Risk  5.0    4.4                                2X Avg.Risk  9.6    7.1                                3X Avg.Risk 23.4   11.0       Assessment & Plan:  As per problem list. Increase Gabapentin , wear wrist splint at night and NCS ordered. Problem List Items Addressed This Visit       Nervous and Auditory   Carpal tunnel syndrome of right wrist   Relevant Medications   gabapentin  (NEURONTIN ) 300 MG capsule     Musculoskeletal and Integument   Osteoarthritis of lumbar spine     Other   Right hand paresthesia - Primary   Relevant Medications   gabapentin  (NEURONTIN ) 300 MG capsule   Other Relevant Orders   Nerve conduction test    Return in about 1 month (around 06/18/2023) for R hand carpal tunnel f/u.   Total time spent: 20 minutes  Arzella Bitters, MD  05/19/2023   This document may have been prepared  by [employer] and as such may include unintentional dictation errors.

## 2023-05-28 ENCOUNTER — Other Ambulatory Visit: Payer: Self-pay | Admitting: Internal Medicine

## 2023-05-28 ENCOUNTER — Other Ambulatory Visit: Payer: Self-pay | Admitting: Cardiovascular Disease

## 2023-05-28 DIAGNOSIS — I10 Essential (primary) hypertension: Secondary | ICD-10-CM

## 2023-06-01 DIAGNOSIS — E8722 Chronic metabolic acidosis: Secondary | ICD-10-CM | POA: Diagnosis not present

## 2023-06-01 DIAGNOSIS — N281 Cyst of kidney, acquired: Secondary | ICD-10-CM | POA: Diagnosis not present

## 2023-06-01 DIAGNOSIS — R809 Proteinuria, unspecified: Secondary | ICD-10-CM | POA: Diagnosis not present

## 2023-06-01 DIAGNOSIS — N1832 Chronic kidney disease, stage 3b: Secondary | ICD-10-CM | POA: Diagnosis not present

## 2023-06-01 DIAGNOSIS — I129 Hypertensive chronic kidney disease with stage 1 through stage 4 chronic kidney disease, or unspecified chronic kidney disease: Secondary | ICD-10-CM | POA: Diagnosis not present

## 2023-06-06 ENCOUNTER — Other Ambulatory Visit: Payer: Self-pay | Admitting: Internal Medicine

## 2023-06-06 DIAGNOSIS — E785 Hyperlipidemia, unspecified: Secondary | ICD-10-CM

## 2023-06-12 ENCOUNTER — Other Ambulatory Visit: Payer: Self-pay | Admitting: Internal Medicine

## 2023-06-20 ENCOUNTER — Ambulatory Visit: Admitting: Internal Medicine

## 2023-07-03 ENCOUNTER — Other Ambulatory Visit: Payer: Self-pay | Admitting: Internal Medicine

## 2023-07-03 DIAGNOSIS — E785 Hyperlipidemia, unspecified: Secondary | ICD-10-CM

## 2023-07-05 ENCOUNTER — Other Ambulatory Visit: Payer: Self-pay | Admitting: Cardiovascular Disease

## 2023-07-10 ENCOUNTER — Other Ambulatory Visit: Payer: Self-pay | Admitting: Internal Medicine

## 2023-07-10 DIAGNOSIS — N184 Chronic kidney disease, stage 4 (severe): Secondary | ICD-10-CM

## 2023-07-10 DIAGNOSIS — M47816 Spondylosis without myelopathy or radiculopathy, lumbar region: Secondary | ICD-10-CM

## 2023-07-17 DIAGNOSIS — N281 Cyst of kidney, acquired: Secondary | ICD-10-CM | POA: Diagnosis not present

## 2023-07-17 DIAGNOSIS — I129 Hypertensive chronic kidney disease with stage 1 through stage 4 chronic kidney disease, or unspecified chronic kidney disease: Secondary | ICD-10-CM | POA: Diagnosis not present

## 2023-07-17 DIAGNOSIS — R809 Proteinuria, unspecified: Secondary | ICD-10-CM | POA: Diagnosis not present

## 2023-07-17 DIAGNOSIS — N1832 Chronic kidney disease, stage 3b: Secondary | ICD-10-CM | POA: Diagnosis not present

## 2023-07-17 DIAGNOSIS — E8722 Chronic metabolic acidosis: Secondary | ICD-10-CM | POA: Diagnosis not present

## 2023-07-21 ENCOUNTER — Emergency Department (HOSPITAL_COMMUNITY)
Admission: EM | Admit: 2023-07-21 | Discharge: 2023-07-21 | Disposition: A | Discharge status: Home / Self Care | Attending: Emergency Medicine | Admitting: Emergency Medicine

## 2023-07-21 ENCOUNTER — Ambulatory Visit: Admitting: Cardiovascular Disease

## 2023-07-21 ENCOUNTER — Other Ambulatory Visit: Payer: Self-pay | Admitting: Internal Medicine

## 2023-07-21 ENCOUNTER — Other Ambulatory Visit: Payer: Self-pay

## 2023-07-21 ENCOUNTER — Emergency Department (HOSPITAL_COMMUNITY)

## 2023-07-21 ENCOUNTER — Encounter (HOSPITAL_COMMUNITY): Payer: Self-pay | Admitting: Emergency Medicine

## 2023-07-21 DIAGNOSIS — J209 Acute bronchitis, unspecified: Secondary | ICD-10-CM | POA: Insufficient documentation

## 2023-07-21 DIAGNOSIS — R059 Cough, unspecified: Secondary | ICD-10-CM | POA: Diagnosis present

## 2023-07-21 DIAGNOSIS — Z7951 Long term (current) use of inhaled steroids: Secondary | ICD-10-CM | POA: Insufficient documentation

## 2023-07-21 DIAGNOSIS — D72829 Elevated white blood cell count, unspecified: Secondary | ICD-10-CM | POA: Diagnosis not present

## 2023-07-21 DIAGNOSIS — J449 Chronic obstructive pulmonary disease, unspecified: Secondary | ICD-10-CM | POA: Insufficient documentation

## 2023-07-21 DIAGNOSIS — R0602 Shortness of breath: Secondary | ICD-10-CM | POA: Diagnosis not present

## 2023-07-21 DIAGNOSIS — Z7902 Long term (current) use of antithrombotics/antiplatelets: Secondary | ICD-10-CM | POA: Diagnosis not present

## 2023-07-21 DIAGNOSIS — Z7982 Long term (current) use of aspirin: Secondary | ICD-10-CM | POA: Insufficient documentation

## 2023-07-21 DIAGNOSIS — Z87891 Personal history of nicotine dependence: Secondary | ICD-10-CM | POA: Insufficient documentation

## 2023-07-21 LAB — BASIC METABOLIC PANEL WITH GFR
Anion gap: 11 (ref 5–15)
BUN: 31 mg/dL — ABNORMAL HIGH (ref 8–23)
CO2: 20 mmol/L — ABNORMAL LOW (ref 22–32)
Calcium: 9.3 mg/dL (ref 8.9–10.3)
Chloride: 102 mmol/L (ref 98–111)
Creatinine, Ser: 1.88 mg/dL — ABNORMAL HIGH (ref 0.61–1.24)
GFR, Estimated: 37 mL/min — ABNORMAL LOW (ref >60)
Glucose, Bld: 101 mg/dL — ABNORMAL HIGH (ref 70–99)
Potassium: 5.1 mmol/L (ref 3.5–5.1)
Sodium: 133 mmol/L — ABNORMAL LOW (ref 135–145)

## 2023-07-21 LAB — CBC
HCT: 42.5 % (ref 39.0–52.0)
Hemoglobin: 13.9 g/dL (ref 13.0–17.0)
MCH: 30 pg (ref 26.0–34.0)
MCHC: 32.7 g/dL (ref 30.0–36.0)
MCV: 91.6 fL (ref 80.0–100.0)
Platelets: 268 10*3/uL (ref 150–400)
RBC: 4.64 MIL/uL (ref 4.22–5.81)
RDW: 13.8 % (ref 11.5–15.5)
WBC: 12 10*3/uL — ABNORMAL HIGH (ref 4.0–10.5)
nRBC: 0 % (ref 0.0–0.2)

## 2023-07-21 MED ORDER — ALBUTEROL SULFATE (2.5 MG/3ML) 0.083% IN NEBU
INHALATION_SOLUTION | RESPIRATORY_TRACT | Status: AC
  Administered 2023-07-21: 2.5 mg
  Filled 2023-07-21: qty 3

## 2023-07-21 MED ORDER — IPRATROPIUM-ALBUTEROL 0.5-2.5 (3) MG/3ML IN SOLN
3.0000 mL | Freq: Once | RESPIRATORY_TRACT | Status: AC
  Administered 2023-07-21: 3 mL via RESPIRATORY_TRACT
  Filled 2023-07-21: qty 3

## 2023-07-21 MED ORDER — ALBUTEROL SULFATE HFA 108 (90 BASE) MCG/ACT IN AERS
2.0000 | INHALATION_SPRAY | Freq: Once | RESPIRATORY_TRACT | Status: AC
  Administered 2023-07-21: 2 via RESPIRATORY_TRACT
  Filled 2023-07-21: qty 6.7

## 2023-07-21 NOTE — Discharge Instructions (Signed)
 Take over-the-counter medicines for cough.  We are giving you an albuterol  inhaler that you may use 1-2 puffs every 4-6 hours for cough, wheezing or shortness of breath.  If you develop fever, new or worsening cough, trouble breathing, chest pain, or any other new/concerning symptoms then return to the ER for evaluation.

## 2023-07-21 NOTE — ED Triage Notes (Addendum)
 Pt in with productive cough with yellow sputum x 2 days, and reported sob x 1hr. Denies any fevers, sob is worse with exertion.

## 2023-07-21 NOTE — ED Provider Notes (Signed)
 [place] EMERGENCY DEPARTMENT AT [facility] Provider Note   CSN: [id number] Arrival date & time: 07/21/23  2017     Patient presents with: Shortness of Breath and Cough   Gregory [name] is a 78 y.o. male.  {Add pertinent medical, surgical, social history, OB history to HPI:1974} HPI 78 year old male presents with cough and shortness of breath that started today.  He has felt subjectively hot like he has a fever but has not checked his temperature.  His cough is productive of yellow sputum.  No leg swelling.  He has chest pain when he coughs.  He does report a history of COPD and former smoking.  Prior to Admission medications   Medication Sig Start Date End Date Taking? Authorizing Provider  acetaminophen  (TYLENOL ) 500 MG tablet Take 1,500 mg by mouth every 6 (six) hours as needed for moderate pain.    [provider]  allopurinol  (ZYLOPRIM ) 100 MG tablet Take 1 tablet by mouth once daily 05/09/23   Tejan-Sie, S Ahmed, MD  aspirin  81 MG chewable tablet Chew 1 tablet (81 mg total) by mouth daily. 12/06/14   Maxie Herlene POUR, PA-C  Azelastine  HCl 137 MCG/SPRAY SOLN Place 1 spray into the nose daily. 03/31/23 06/29/23  Albina GORMAN Dine, MD  benzonatate  (TESSALON ) 100 MG capsule Take 1 capsule by mouth three times daily as needed for cough Patient not taking: Reported on 05/19/2023 03/17/23   Albina GORMAN Dine, MD  BREZTRI AEROSPHERE 160-9-4.8 MCG/ACT AERO INHALE 2 PUFFS INTO LUNGS TWICE DAILY 04/24/23   Albina GORMAN Dine, MD  clopidogrel  (PLAVIX ) 75 MG tablet Take 1 tablet by mouth once daily 07/06/23   Fernand Denyse LABOR, MD  Colchicine 0.6 MG CAPS TAKE 2 CAPSULES BY MOUTH FOR THE FIRST DOSE, THEN TAKE 1 CAPSULE ONE HOUR LATER, THEN TAKE 1 CAPSULE ONCE DAILY UNTIL PAIN FREE. 07/10/23   Albina GORMAN Dine, MD  cyclobenzaprine  (FLEXERIL ) 5 MG tablet Take 1 tablet by mouth three times daily as needed for muscle spasm 07/10/23   Albina GORMAN Dine, MD  dapagliflozin propanediol  (FARXIGA) 5 MG TABS tablet Take 5 mg by mouth daily. 10/16/22   [provider]  EQ ALLERGY RELIEF, CETIRIZINE, 10 MG tablet TAKE 1 TABLET BY MOUTH ONCE DAILY IN THE MORNING 05/29/23   Albina GORMAN Dine, MD  fluticasone  (FLONASE) 50 MCG/ACT nasal spray Place 1 spray into both nostrils 2 (two) times daily. 11/18/14   [provider]  furosemide (LASIX) 20 MG tablet Take 1 tablet by mouth once daily 02/13/23   Fernand Denyse LABOR, MD  gabapentin  (NEURONTIN ) 300 MG capsule Take 1 capsule (300 mg total) by mouth 3 (three) times daily. 05/19/23 08/17/23  Albina GORMAN Dine, MD  isosorbide  mononitrate (IMDUR ) 30 MG 24 hr tablet Take 1 tablet by mouth once daily 05/29/23   Fernand Denyse LABOR, MD  lisinopril  (ZESTRIL ) 40 MG tablet Take 1 tablet by mouth once daily 03/17/23   Tejan-Sie, S Ahmed, MD  metoprolol  tartrate (LOPRESSOR ) 25 MG tablet Take 1/2 (one-half) tablet by mouth twice daily 05/29/23   Tejan-Sie, S Ahmed, MD  rosuvastatin  (CRESTOR ) 40 MG tablet TAKE 1 TABLET BY MOUTH ONCE DAILY IN THE EVENING 07/03/23   Albina GORMAN Dine, MD  sodium bicarbonate  650 MG tablet Take 1 tablet by mouth twice daily 07/10/23   Tejan-Sie, S Ahmed, MD  spironolactone  (ALDACTONE ) 25 MG tablet Take 1 tablet by mouth once daily 08/12/22   Fernand Denyse LABOR, MD  traZODone  (DESYREL ) 100 MG  tablet Take 1 tablet (100 mg total) by mouth at bedtime as needed. for sleep 03/31/23   Albina GORMAN Dine, MD    Allergies: Patient has no known allergies.    Review of Systems  Constitutional:  Positive for fever. Unexpected weight change: subjective. Respiratory:  Positive for cough and shortness of breath.   Cardiovascular:  Positive for chest pain (with coughing). Negative for leg swelling.    Updated Vital Signs BP 127/65   Pulse 77   Temp 99.1 F (37.3 C) (Oral)   Resp 20   Wt 85.4 kg   SpO2 97%   BMI 32.32 kg/m   Physical Exam Vitals and nursing note reviewed.  Constitutional:      Appearance: He is well-developed.   HENT:     Head: Normocephalic and atraumatic.   Cardiovascular:     Rate and Rhythm: Normal rate and regular rhythm.     Heart sounds: Normal heart sounds.  Pulmonary:     Effort: Pulmonary effort is normal. No accessory muscle usage or respiratory distress.     Breath sounds: Normal breath sounds.  Abdominal:     Palpations: Abdomen is soft.     Tenderness: There is no abdominal tenderness.   Musculoskeletal:     Right lower leg: No edema.     Left lower leg: No edema.   Skin:    General: Skin is warm and dry.   Neurological:     Mental Status: He is alert.     (all labs ordered are listed, but only abnormal results are displayed) Labs Reviewed  BASIC METABOLIC PANEL WITH GFR - Abnormal; Notable for the following components:      Result Value   Sodium 133 (*)    CO2 20 (*)    Glucose, Bld 101 (*)    BUN 31 (*)    Creatinine, Ser 1.88 (*)    GFR, Estimated 37 (*)    All other components within normal limits  CBC - Abnormal; Notable for the following components:   WBC 12.0 (*)    All other components within normal limits    EKG: None  Radiology: DG Chest 2 View Result Date: 07/21/2023 CLINICAL DATA:  sob EXAM: CHEST - 2 VIEW COMPARISON:  None available. FINDINGS: No focal airspace consolidation, pleural effusion, or pneumothorax. No cardiomegaly.No acute fracture or destructive lesion. IMPRESSION: No acute cardiopulmonary abnormality. Electronically Signed   By: Rogelia Myers M.D.   On: 07/21/2023 21:05    {Document cardiac monitor, telemetry assessment procedure when appropriate:32947} Procedures   Medications Ordered in the ED  ipratropium-albuterol  (DUONEB) 0.5-2.5 (3) MG/3ML nebulizer solution 3 mL (has no administration in time range)      {Click here for ABCD2, HEART and other calculators REFRESH Note before signing:1}                              Medical Decision Making Amount and/or Complexity of Data Reviewed Labs: ordered. Radiology:  ordered.  Risk Prescription drug management.   ***  {Document critical care time when appropriate  Document review of labs and clinical decision tools ie CHADS2VASC2, etc  Document your independent review of radiology images and any outside records  Document your discussion with family members, caretakers and with consultants  Document social determinants of health affecting pt's care  Document your decision making why or why not admission, treatments were needed:32947:::1}   Final diagnoses:  None  ED Discharge Orders     None

## 2023-07-31 ENCOUNTER — Ambulatory Visit: Admitting: Cardiovascular Disease

## 2023-07-31 ENCOUNTER — Other Ambulatory Visit: Payer: Self-pay | Admitting: Internal Medicine

## 2023-07-31 DIAGNOSIS — E785 Hyperlipidemia, unspecified: Secondary | ICD-10-CM

## 2023-08-04 ENCOUNTER — Other Ambulatory Visit: Payer: Self-pay | Admitting: Cardiovascular Disease

## 2023-08-10 ENCOUNTER — Other Ambulatory Visit: Payer: Self-pay | Admitting: Internal Medicine

## 2023-08-10 ENCOUNTER — Ambulatory Visit: Admitting: Cardiovascular Disease

## 2023-08-10 DIAGNOSIS — M47816 Spondylosis without myelopathy or radiculopathy, lumbar region: Secondary | ICD-10-CM

## 2023-08-14 NOTE — Progress Notes (Signed)
   08/14/2023  Patient ID: Gregory [name], male   DOB: April 18, 1945, 79 y.o.   MRN: [id number]  Pharmacy Quality Measure Review  This patient is appearing on a report for being at risk of failing the adherence measure for diabetes medications this calendar year.   Medication: Farxiga Last fill date: 07/12/23 for 90 day supply  Insurance report was not up to date. No action needed at this time.   [name], PharmD Clinical Pharmacist [phone]

## 2023-08-21 ENCOUNTER — Ambulatory Visit: Admitting: Internal Medicine

## 2023-08-27 ENCOUNTER — Other Ambulatory Visit: Payer: Self-pay | Admitting: Internal Medicine

## 2023-08-27 ENCOUNTER — Other Ambulatory Visit: Payer: Self-pay | Admitting: Cardiovascular Disease

## 2023-08-27 DIAGNOSIS — I10 Essential (primary) hypertension: Secondary | ICD-10-CM

## 2023-08-27 DIAGNOSIS — N184 Chronic kidney disease, stage 4 (severe): Secondary | ICD-10-CM

## 2023-08-27 DIAGNOSIS — M47816 Spondylosis without myelopathy or radiculopathy, lumbar region: Secondary | ICD-10-CM

## 2023-08-28 ENCOUNTER — Ambulatory Visit: Admitting: Internal Medicine

## 2023-08-28 ENCOUNTER — Other Ambulatory Visit: Payer: Self-pay | Admitting: Internal Medicine

## 2023-08-28 DIAGNOSIS — E785 Hyperlipidemia, unspecified: Secondary | ICD-10-CM

## 2023-08-29 ENCOUNTER — Other Ambulatory Visit: Payer: Self-pay | Admitting: Internal Medicine

## 2023-09-08 ENCOUNTER — Encounter: Payer: Self-pay | Admitting: Cardiovascular Disease

## 2023-09-08 ENCOUNTER — Ambulatory Visit (INDEPENDENT_AMBULATORY_CARE_PROVIDER_SITE_OTHER): Admitting: Cardiovascular Disease

## 2023-09-08 ENCOUNTER — Other Ambulatory Visit: Payer: Self-pay | Admitting: Internal Medicine

## 2023-09-08 VITALS — BP 98/60 | HR 101 | Ht 64.0 in | Wt 193.8 lb

## 2023-09-08 DIAGNOSIS — I2510 Atherosclerotic heart disease of native coronary artery without angina pectoris: Secondary | ICD-10-CM

## 2023-09-08 DIAGNOSIS — I10 Essential (primary) hypertension: Secondary | ICD-10-CM

## 2023-09-08 DIAGNOSIS — I340 Nonrheumatic mitral (valve) insufficiency: Secondary | ICD-10-CM | POA: Diagnosis not present

## 2023-09-08 DIAGNOSIS — E785 Hyperlipidemia, unspecified: Secondary | ICD-10-CM

## 2023-09-08 DIAGNOSIS — R0602 Shortness of breath: Secondary | ICD-10-CM | POA: Diagnosis not present

## 2023-09-08 DIAGNOSIS — I5033 Acute on chronic diastolic (congestive) heart failure: Secondary | ICD-10-CM

## 2023-09-08 MED ORDER — METOPROLOL TARTRATE 25 MG PO TABS: 12.5000 mg | ORAL_TABLET | Freq: Two times a day (BID) | ORAL | 0 refills | Status: AC

## 2023-09-08 NOTE — Progress Notes (Signed)
 Cardiology Office Note   Date:  09/08/2023   ID:  Bridget, Westbrooks 1945/12/12, MRN [id number]  PCP:  Albina GORMAN Dine, MD  Cardiologist:  Denyse Bathe, MD      History of Present Illness: Gregory [name] is a 78 y.o. 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   Pneumothorax    Associated with pneumonia     Past Surgical History:  Procedure Laterality Date   Arm surgery     CARDIAC CATHETERIZATION N/A 12/05/2014   Procedure: Left Heart Cath and Coronary Angiography;  Surgeon: Victory LELON Sharps, MD;  Location: [facility] INVASIVE CV LAB;  Service: Cardiovascular;  Laterality: N/A;   CATARACT EXTRACTION W/PHACO Right 01/14/2013   Procedure: RIGHT EYE CATARACT EXTRACTION PHACO AND INTRAOCULAR LENS PLACEMENT ;  Surgeon: Cherene Mania, MD;  Location: [facility] ORS;  Service: Ophthalmology;  Laterality: Right;  CDE 54.97   CATARACT EXTRACTION W/PHACO Left 02/18/2013   Procedure: CATARACT EXTRACTION PHACO AND INTRAOCULAR LENS PLACEMENT (IOC);  Surgeon: Cherene Mania, MD;  Location: [facility];  Service: Ophthalmology;  Laterality: Left;  CDE 34.02   COLONOSCOPY WITH PROPOFOL N/A 07/13/2016   Procedure: COLONOSCOPY WITH PROPOFOL;  Surgeon: Viktoria Lamar DASEN, MD;  Location: [facility] ENDOSCOPY;  Service: Endoscopy;  Laterality: N/A;   INGUINAL HERNIA REPAIR Right 09/09/2012   Procedure: HERNIA REPAIR INGUINAL INCARCERATED;  Surgeon: Alm VEAR Angle, MD;  Location: [facility] ORS;  Service: General;  Laterality: Right;   INSERTION OF MESH Right 09/09/2012   Procedure: INSERTION OF MESH;  Surgeon: Alm VEAR Angle, MD;  Location: [facility] OR[facility];  Service: General;  Laterality: Right;   LEFT HEART CATH AND CORONARY ANGIOGRAPHY Right 09/21/2021   Procedure: LEFT HEART CATH AND CORONARY ANGIOGRAPHY with intervention;  Surgeon: Bathe Denyse LABOR, MD;  Location: [facility] INVASIVE CV LAB;  Service: Cardiovascular;  Laterality: Right;     Current Outpatient Medications  Medication Sig Dispense Refill   acetaminophen (TYLENOL) 500 MG tablet Take 1,500 mg by mouth every 6 (six) hours as needed for moderate pain.     allopurinol (ZYLOPRIM) 100 MG tablet Take 1 tablet by mouth once daily 90 tablet 0   aspirin 81 MG chewable tablet Chew 1 tablet (81 mg total) by mouth daily.     Azelastine HCl 137 MCG/SPRAY SOLN Place 1 spray into the nose daily. 30 mL 2   budesonide-glycopyrrolate-formoterol (BREZTRI AEROSPHERE) 160-9-4.8 MCG/ACT AERO inhaler Inhale 2 puffs by mouth twice daily 30 g 0   cetirizine (ZYRTEC) 10 MG tablet TAKE 1 TABLET BY MOUTH ONCE DAILY IN THE MORNING 90 tablet 0   clopidogrel (PLAVIX) 75 MG tablet Take 1 tablet by mouth once daily 90 tablet 0   Colchicine 0.6 MG CAPS TAKE 2 CAPSULES BY MOUTH FOR THE FIRST DOSE, AND 1 CAPSULE ONE HOUR LATER. THEN TAKE 1 CAPSULE ONCE DAILY UNTIL  PAIN  FREE. 15 capsule 0   cyclobenzaprine (FLEXERIL) 5 MG tablet Take 1 tablet by mouth three times daily as needed for muscle spasm 30 tablet 0   dapagliflozin propanediol (FARXIGA) 5 MG TABS tablet Take 5 mg by mouth daily.     fluticasone (FLONASE) 50 MCG/ACT nasal spray Place 1 spray into both nostrils  2 (two) times daily.     furosemide (LASIX) 20 MG tablet Take 1 tablet by mouth once daily 90 tablet 0   gabapentin (NEURONTIN) 300 MG capsule Take 1 capsule (300 mg total) by mouth 3 (three) times daily. 90 capsule 2   isosorbide mononitrate (IMDUR) 30 MG 24 hr tablet Take 1 tablet by mouth once daily 90 tablet 0   lisinopril (ZESTRIL) 40 MG tablet Take 1 tablet by mouth once daily 90 tablet 0   rosuvastatin (CRESTOR) 40 MG tablet TAKE 1 TABLET BY MOUTH ONCE DAILY IN THE EVENING 15 tablet 0   sodium bicarbonate 650 MG tablet Take 1 tablet by mouth twice daily 30 tablet 0   spironolactone (ALDACTONE) 25 MG tablet Take 1 tablet by mouth once  daily 30 tablet 0   traZODone (DESYREL) 100 MG tablet Take 1 tablet (100 mg total) by mouth at bedtime as needed. for sleep 30 tablet 2   benzonatate (TESSALON) 100 MG capsule Take 1 capsule by mouth three times daily as needed for cough (Patient not taking: Reported on 09/08/2023) 30 capsule 0   metoprolol tartrate (LOPRESSOR) 25 MG tablet Take 0.5 tablets (12.5 mg total) by mouth 2 (two) times daily. 90 tablet 0   No current facility-administered medications for this visit.    Allergies:   Patient has no known allergies.    Social History:   reports that he quit smoking about 45 years ago. His smoking use included cigarettes. He has never used smokeless tobacco. He reports that he does not drink alcohol and does not use drugs.   Family History:  family history includes COPD in his father; Stroke in his brother and son.    ROS:     Review of Systems  Constitutional: Negative.   HENT: Negative.    Eyes: Negative.   Respiratory: Negative.    Gastrointestinal: Negative.   Genitourinary: Negative.   Musculoskeletal: Negative.   Skin: Negative.   Neurological: Negative.   Endo/Heme/Allergies: Negative.   Psychiatric/Behavioral: Negative.    All other systems reviewed and are negative.     All other systems are reviewed and negative.    PHYSICAL EXAM: VS:  BP 98/60   Pulse (!) 101   Ht 5' 4 (1.626 m)   Wt 193 lb 12.8 oz (87.9 kg)   SpO2 95%   BMI 33.27 kg/m  , BMI Body mass index is 33.27 kg/m. Last weight:  Wt Readings from Last 3 Encounters:  09/08/23 193 lb 12.8 oz (87.9 kg)  07/21/23 188 lb 4.4 oz (85.4 kg)  05/19/23 188 lb 3.2 oz (85.4 kg)     Physical Exam Vitals reviewed.  Constitutional:      Appearance: Normal appearance. He is normal weight.  HENT:     Head: Normocephalic.     Nose: Nose normal.     Mouth/Throat:     Mouth: Mucous membranes are moist.  Eyes:     Pupils: Pupils are equal, round, and reactive to light.  Cardiovascular:     Rate and  Rhythm: Normal rate and regular rhythm.     Pulses: Normal pulses.     Heart sounds: Normal heart sounds.  Pulmonary:     Effort: Pulmonary effort is normal.  Abdominal:     General: Abdomen is flat. Bowel sounds are normal.  Musculoskeletal:        General: Normal range of motion.     Cervical back: Normal range of motion.  Skin:    General:  Skin is warm.  Neurological:     General: No focal deficit present.     Mental Status: He is alert.  Psychiatric:        Mood and Affect: Mood normal.       EKG:   Recent Labs: 09/30/2022: Magnesium 2.3 03/30/2023: ALT 24 07/21/2023: BUN 31; Creatinine, Ser 1.88; Hemoglobin 13.9; Platelets 268; Potassium 5.1; Sodium 133    Lipid Panel    Component Value Date/Time   CHOL 109 03/30/2023 1003   TRIG 101 03/30/2023 1003   HDL 42 03/30/2023 1003   CHOLHDL 2.6 03/30/2023 1003   CHOLHDL 3.9 12/05/2014 0709   VLDL 21 12/05/2014 0709   LDLCALC 48 03/30/2023 1003      Other studies Reviewed: Additional studies/ records that were reviewed today include:  Review of the above records demonstrates:       No data to display            ASSESSMENT AND PLAN:    ICD-10-CM   1. CHF (congestive heart failure), NYHA class III, acute on chronic, diastolic (HCC)  I50.33    compensated on GDMT    2. Nonrheumatic mitral valve regurgitation  I34.0     3. Essential hypertension  I10 metoprolol tartrate (LOPRESSOR) 25 MG tablet    4. Dyslipidemia  E78.5     5. SOB (shortness of breath)  R06.02    Tachycardiac, resent metoprolol as can not recall taking it.    6. CAD, multiple vessel  I25.10        Problem List Items Addressed This Visit       Cardiovascular and Mediastinum   CAD, multiple vessel   Relevant Medications   metoprolol tartrate (LOPRESSOR) 25 MG tablet   Essential hypertension   Relevant Medications   metoprolol tartrate (LOPRESSOR) 25 MG tablet   Nonrheumatic mitral valve regurgitation   Relevant Medications    metoprolol tartrate (LOPRESSOR) 25 MG tablet     Other   Dyslipidemia   SOB (shortness of breath)   Other Visit Diagnoses       CHF (congestive heart failure), NYHA class III, acute on chronic, diastolic (HCC)    -  Primary   compensated on GDMT   Relevant Medications   metoprolol tartrate (LOPRESSOR) 25 MG tablet          Disposition:   Return in about 3 months (around 12/09/2023).    Total time spent: 30 minutes  Signed,  Denyse Bathe, MD  09/08/2023 3:00 PM    [facility]

## 2023-09-21 ENCOUNTER — Other Ambulatory Visit: Payer: Self-pay | Admitting: Internal Medicine

## 2023-09-28 ENCOUNTER — Other Ambulatory Visit: Payer: Self-pay | Admitting: Internal Medicine

## 2023-09-28 ENCOUNTER — Other Ambulatory Visit: Payer: Self-pay | Admitting: Cardiovascular Disease

## 2023-09-28 DIAGNOSIS — G4700 Insomnia, unspecified: Secondary | ICD-10-CM

## 2023-09-28 DIAGNOSIS — M47816 Spondylosis without myelopathy or radiculopathy, lumbar region: Secondary | ICD-10-CM

## 2023-09-28 DIAGNOSIS — N184 Chronic kidney disease, stage 4 (severe): Secondary | ICD-10-CM

## 2023-09-28 DIAGNOSIS — E785 Hyperlipidemia, unspecified: Secondary | ICD-10-CM

## 2023-09-29 ENCOUNTER — Emergency Department (HOSPITAL_COMMUNITY)
Admission: EM | Admit: 2023-09-29 | Discharge: 2023-09-29 | Disposition: A | Discharge status: Home / Self Care | Attending: Emergency Medicine | Admitting: Emergency Medicine

## 2023-09-29 ENCOUNTER — Other Ambulatory Visit: Payer: Self-pay

## 2023-09-29 ENCOUNTER — Encounter (HOSPITAL_COMMUNITY): Payer: Self-pay

## 2023-09-29 ENCOUNTER — Emergency Department (HOSPITAL_COMMUNITY)

## 2023-09-29 DIAGNOSIS — N189 Chronic kidney disease, unspecified: Secondary | ICD-10-CM | POA: Insufficient documentation

## 2023-09-29 DIAGNOSIS — Z7982 Long term (current) use of aspirin: Secondary | ICD-10-CM | POA: Insufficient documentation

## 2023-09-29 DIAGNOSIS — D72829 Elevated white blood cell count, unspecified: Secondary | ICD-10-CM | POA: Diagnosis not present

## 2023-09-29 DIAGNOSIS — T148XXA Other injury of unspecified body region, initial encounter: Secondary | ICD-10-CM

## 2023-09-29 DIAGNOSIS — R1084 Generalized abdominal pain: Secondary | ICD-10-CM

## 2023-09-29 DIAGNOSIS — K5730 Diverticulosis of large intestine without perforation or abscess without bleeding: Secondary | ICD-10-CM | POA: Diagnosis not present

## 2023-09-29 DIAGNOSIS — R109 Unspecified abdominal pain: Secondary | ICD-10-CM | POA: Diagnosis present

## 2023-09-29 DIAGNOSIS — I129 Hypertensive chronic kidney disease with stage 1 through stage 4 chronic kidney disease, or unspecified chronic kidney disease: Secondary | ICD-10-CM | POA: Insufficient documentation

## 2023-09-29 DIAGNOSIS — S301XXA Contusion of abdominal wall, initial encounter: Secondary | ICD-10-CM | POA: Insufficient documentation

## 2023-09-29 DIAGNOSIS — X58XXXA Exposure to other specified factors, initial encounter: Secondary | ICD-10-CM | POA: Diagnosis not present

## 2023-09-29 DIAGNOSIS — N3289 Other specified disorders of bladder: Secondary | ICD-10-CM | POA: Diagnosis not present

## 2023-09-29 DIAGNOSIS — Z79899 Other long term (current) drug therapy: Secondary | ICD-10-CM | POA: Diagnosis not present

## 2023-09-29 LAB — COMPREHENSIVE METABOLIC PANEL WITH GFR
ALT: 19 U/L (ref 0–44)
AST: 44 U/L — ABNORMAL HIGH (ref 15–41)
Albumin: 3.8 g/dL (ref 3.5–5.0)
Alkaline Phosphatase: 61 U/L (ref 38–126)
Anion gap: 16 — ABNORMAL HIGH (ref 5–15)
BUN: 23 mg/dL (ref 8–23)
CO2: 19 mmol/L — ABNORMAL LOW (ref 22–32)
Calcium: 9.6 mg/dL (ref 8.9–10.3)
Chloride: 101 mmol/L (ref 98–111)
Creatinine, Ser: 2.45 mg/dL — ABNORMAL HIGH (ref 0.61–1.24)
GFR, Estimated: 27 mL/min — ABNORMAL LOW (ref >60)
Glucose, Bld: 109 mg/dL — ABNORMAL HIGH (ref 70–99)
Potassium: 4.5 mmol/L (ref 3.5–5.1)
Sodium: 136 mmol/L (ref 135–145)
Total Bilirubin: 1.7 mg/dL — ABNORMAL HIGH (ref 0.0–1.2)
Total Protein: 7.5 g/dL (ref 6.5–8.1)

## 2023-09-29 LAB — URINALYSIS, ROUTINE W REFLEX MICROSCOPIC
Bacteria, UA: NONE SEEN
Bilirubin Urine: NEGATIVE
Glucose, UA: >=500 mg/dL — AB
Ketones, ur: NEGATIVE mg/dL
Leukocytes,Ua: NEGATIVE
Nitrite: NEGATIVE
Protein, ur: 30 mg/dL — AB
Specific Gravity, Urine: 1.013 (ref 1.005–1.030)
pH: 5 (ref 5.0–8.0)

## 2023-09-29 LAB — CBC
HCT: 41 % (ref 39.0–52.0)
Hemoglobin: 13.3 g/dL (ref 13.0–17.0)
MCH: 28 pg (ref 26.0–34.0)
MCHC: 32.4 g/dL (ref 30.0–36.0)
MCV: 86.3 fL (ref 80.0–100.0)
Platelets: 310 K/uL (ref 150–400)
RBC: 4.75 MIL/uL (ref 4.22–5.81)
RDW: 14.9 % (ref 11.5–15.5)
WBC: 13.8 K/uL — ABNORMAL HIGH (ref 4.0–10.5)
nRBC: 0 % (ref 0.0–0.2)

## 2023-09-29 LAB — LIPASE, BLOOD: Lipase: 36 U/L (ref 11–51)

## 2023-09-29 NOTE — Discharge Instructions (Signed)
 Return if any problems.

## 2023-09-29 NOTE — ED Triage Notes (Signed)
 Pt stated that when he coughs, he has severe back pain and abd pain that aches and burns. Pt also states that he has also been vomiting. All symptoms started roughly a week ago

## 2023-09-30 NOTE — ED Provider Notes (Signed)
 [place] EMERGENCY DEPARTMENT AT [facility] Provider Note   CSN: [id number] Arrival date & time: 09/29/23  1704     Patient presents with: Abdominal Pain   Gregory [name] is a 78 y.o. male.   Patient complains of right-sided abdominal pain.  Patient states that he has burning and tingling to the right side of his abdomen.  Patient states he has had some nausea and some vomiting.  Patient denies any fever or chills he has not had any diarrhea.  Patient reports the pain is worse when he walks and the pain is worse when he coughs.  Patient denies any fever or chills he is not having constipation.  Patient has a past medical history of an NSTEMI coronary artery disease hypertension, unstable angina, mitral valve regurgitation, chronic kidney disease and dyslipidemia.  Patient is currently taking Plavix .  The history is provided by the patient. No language interpreter was used.  Abdominal Pain      Prior to Admission medications   Medication Sig Start Date End Date Taking? Authorizing Provider  acetaminophen  (TYLENOL ) 500 MG tablet Take 1,500 mg by mouth every 6 (six) hours as needed for moderate pain.    [provider]  allopurinol  (ZYLOPRIM ) 100 MG tablet Take 1 tablet by mouth once daily 05/09/23   Tejan-Sie, S Ahmed, MD  aspirin  81 MG chewable tablet Chew 1 tablet (81 mg total) by mouth daily. 12/06/14   Maxie Herlene POUR, PA-C  Azelastine  HCl 137 MCG/SPRAY SOLN Place 1 spray into the nose daily. 03/31/23 09/08/23  Albina GORMAN Dine, MD  benzonatate  (TESSALON ) 100 MG capsule Take 1 capsule by mouth three times daily as needed for cough Patient not taking: Reported on 09/08/2023 03/17/23   Tejan-Sie, S Ahmed, MD  BREZTRI  AEROSPHERE 160-9-4.8 MCG/ACT AERO inhaler INHALE 2 PUFFS INTO LUNGS TWICE DAILY 09/22/23   Albina GORMAN Dine, MD  cetirizine (ZYRTEC) 10 MG tablet TAKE 1 TABLET BY MOUTH ONCE DAILY IN THE MORNING 08/29/23   Albina GORMAN Dine, MD  clopidogrel  (PLAVIX ) 75  MG tablet Take 1 tablet by mouth once daily 09/29/23   Fernand Denyse LABOR, MD  Colchicine 0.6 MG CAPS TAKE 2 CAPSULES BY MOUTH FOR THE FIRST DOSE, AND 1 CAPSULE ONE HOUR LATER. THEN TAKE 1 CAPSULE ONCE DAILY UNTIL  PAIN  FREE. 08/28/23   Albina GORMAN Dine, MD  cyclobenzaprine  (FLEXERIL ) 5 MG tablet Take 1 tablet by mouth three times daily as needed for muscle spasm 09/29/23   Albina GORMAN Dine, MD  dapagliflozin propanediol (FARXIGA) 5 MG TABS tablet Take 5 mg by mouth daily. 10/16/22   [provider]  fluticasone  (FLONASE) 50 MCG/ACT nasal spray Place 1 spray into both nostrils 2 (two) times daily. 11/18/14   [provider]  furosemide (LASIX) 20 MG tablet Take 1 tablet by mouth once daily 08/04/23   Scoggins, [facility], NP  gabapentin  (NEURONTIN ) 300 MG capsule Take 1 capsule (300 mg total) by mouth 3 (three) times daily. 05/19/23 09/08/23  Albina GORMAN Dine, MD  isosorbide  mononitrate (IMDUR ) 30 MG 24 hr tablet Take 1 tablet by mouth once daily 08/28/23   Scoggins, Amber, NP  lisinopril  (ZESTRIL ) 40 MG tablet Take 1 tablet by mouth once daily 09/08/23   Tejan-Sie, S Ahmed, MD  metoprolol  tartrate (LOPRESSOR ) 25 MG tablet Take 0.5 tablets (12.5 mg total) by mouth 2 (two) times daily. 09/08/23   Fernand Denyse LABOR, MD  rosuvastatin  (CRESTOR ) 40 MG tablet TAKE 1 TABLET BY MOUTH ONCE DAILY  IN THE EVENING 09/29/23   Albina GORMAN Dine, MD  sodium bicarbonate  650 MG tablet Take 1 tablet by mouth twice daily 09/29/23   Tejan-Sie, S Ahmed, MD  spironolactone  (ALDACTONE ) 25 MG tablet Take 1 tablet by mouth once daily 08/12/22   Fernand Denyse LABOR, MD  traZODone  (DESYREL ) 100 MG tablet TAKE 1 TABLET BY MOUTH AT BEDTIME AS NEEDED FOR SLEEP 09/29/23   Albina GORMAN Dine, MD    Allergies: Patient has no known allergies.    Review of Systems  Gastrointestinal:  Positive for abdominal pain.  All other systems reviewed and are negative.   Updated Vital Signs BP 117/68 (BP Location: Right Arm)   Pulse 92   Temp  98.6 F (37 C) (Oral)   Resp 17   Ht 5' 4 (1.626 m)   Wt 88 kg   SpO2 94%   BMI 33.30 kg/m   Physical Exam Vitals reviewed.  Constitutional:      Appearance: He is well-developed.  Cardiovascular:     Rate and Rhythm: Normal rate and regular rhythm.  Abdominal:     General: Abdomen is flat. Bowel sounds are normal.     Palpations: Abdomen is soft.     Comments: 20 cm bruised area right upper abdomen,  tender to palpation   Neurological:     General: No focal deficit present.     Mental Status: He is alert.  Psychiatric:        Mood and Affect: Mood normal.     (all labs ordered are listed, but only abnormal results are displayed) Labs Reviewed  COMPREHENSIVE METABOLIC PANEL WITH GFR - Abnormal; Notable for the following components:      Result Value   CO2 19 (*)    Glucose, Bld 109 (*)    Creatinine, Ser 2.45 (*)    AST 44 (*)    Total Bilirubin 1.7 (*)    GFR, Estimated 27 (*)    Anion gap 16 (*)    All other components within normal limits  CBC - Abnormal; Notable for the following components:   WBC 13.8 (*)    All other components within normal limits  URINALYSIS, ROUTINE W REFLEX MICROSCOPIC - Abnormal; Notable for the following components:   Glucose, UA >=500 (*)    Hgb urine dipstick MODERATE (*)    Protein, ur 30 (*)    All other components within normal limits  LIPASE, BLOOD    EKG: None  Radiology: CT ABDOMEN PELVIS WO CONTRAST Result Date: 09/29/2023 CLINICAL DATA:  Acute abdominal pain, right-sided abdominal bruising EXAM: CT ABDOMEN AND PELVIS WITHOUT CONTRAST TECHNIQUE: Multidetector CT imaging of the abdomen and pelvis was performed following the standard protocol without IV contrast. RADIATION DOSE REDUCTION: This exam was performed according to the departmental dose-optimization program which includes automated exposure control, adjustment of the mA and/or kV according to patient size and/or use of iterative reconstruction technique. COMPARISON:   09/29/2022 FINDINGS: Lower chest: Minimal ground-glass opacities within the dependent left lower lobe may be hypoventilatory, inflammatory, or infectious. Hepatobiliary: Unremarkable unenhanced appearance of the liver and gallbladder. No biliary duct dilation. Pancreas: Unremarkable unenhanced appearance. Spleen: Unremarkable unenhanced appearance. Adrenals/Urinary Tract: No urinary tract calculi or obstructive uropathy within either kidney. 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 No specific imaging follow-up is recommended. The adrenals are unremarkable. There is mild diffuse bladder wall thickening, which may reflect sequela of chronic bladder outlet obstruction in  a patient with an enlarged prostate. Stomach/Bowel: No bowel obstruction or ileus. Normal appendix right lower quadrant. Distal colonic diverticulosis without diverticulitis. No bowel wall thickening or inflammatory change. Vascular/Lymphatic: Aortic atherosclerosis. No enlarged abdominal or pelvic lymph nodes. Reproductive: Stable enlargement of the prostate. Other: No free fluid or free intraperitoneal gas. Moderate fat containing left inguinal hernia unchanged. Musculoskeletal: There is enlargement and increased attenuation within the superior aspect of the right rectus musculature, measuring approximately 3.0 x 4.9 x 4.5 cm reference image 46/2, compatible with intramuscular hematoma. There is overlying subcutaneous fat stranding within the right anterolateral abdominal wall at this level, compatible with given history of ecchymosis. There are no acute displaced fractures. Reconstructed images demonstrate no additional findings. IMPRESSION: 1. Intramuscular hematoma involving the right rectus musculature, with overlying subcutaneous fat stranding consistent with given history of right abdominal wall ecchymosis. 2. Enlarged prostate, with mild diffuse bladder wall thickening  likely reflecting sequela of chronic bladder outlet obstruction. 3. Distal colonic diverticulosis without diverticulitis. 4.  Aortic Atherosclerosis (ICD10-I70.0). Electronically Signed   By: Ozell Daring M.D.   On: 09/29/2023 20:28     Procedures   Medications Ordered in the ED - No data to display                                  Medical Decision Making Pt complains of right sided abdominal pain   Amount and/or Complexity of Data Reviewed Labs: ordered.    Details: Labs ordered reviewed and interpreted.  His white blood cell count is 13.8 Radiology: ordered.    Details: CT abdomen shows intramuscular hematoma involving the right rectus muscularis  Risk Risk Details: Patient counseled on results.  Patient is unaware of having injured himself.  He was unaware that he had bruising to his right side.  Patient is counseled on hematoma and follow-up he is discharged in stable condition        Final diagnoses:  Hematoma  Generalized abdominal pain    ED Discharge Orders     None       An After Visit Summary was printed and given to the patient.    Trooper Olander K, PA-C 09/30/23 ARTEMUS Towana Ozell JAYSON, MD 10/02/23 [phone]

## 2023-10-02 ENCOUNTER — Encounter (HOSPITAL_COMMUNITY): Payer: Self-pay

## 2023-10-02 ENCOUNTER — Emergency Department (HOSPITAL_COMMUNITY)

## 2023-10-02 ENCOUNTER — Observation Stay (HOSPITAL_COMMUNITY)
Admission: EM | Admit: 2023-10-02 | Discharge: 2023-10-04 | Disposition: A | Discharge status: Home / Self Care | Attending: Internal Medicine | Admitting: Internal Medicine

## 2023-10-02 ENCOUNTER — Other Ambulatory Visit: Payer: Self-pay

## 2023-10-02 DIAGNOSIS — I130 Hypertensive heart and chronic kidney disease with heart failure and stage 1 through stage 4 chronic kidney disease, or unspecified chronic kidney disease: Secondary | ICD-10-CM | POA: Insufficient documentation

## 2023-10-02 DIAGNOSIS — E669 Obesity, unspecified: Secondary | ICD-10-CM | POA: Diagnosis present

## 2023-10-02 DIAGNOSIS — Z7982 Long term (current) use of aspirin: Secondary | ICD-10-CM | POA: Diagnosis not present

## 2023-10-02 DIAGNOSIS — Z79899 Other long term (current) drug therapy: Secondary | ICD-10-CM | POA: Insufficient documentation

## 2023-10-02 DIAGNOSIS — N179 Acute kidney failure, unspecified: Principal | ICD-10-CM | POA: Diagnosis present

## 2023-10-02 DIAGNOSIS — Z87891 Personal history of nicotine dependence: Secondary | ICD-10-CM | POA: Insufficient documentation

## 2023-10-02 DIAGNOSIS — I5032 Chronic diastolic (congestive) heart failure: Secondary | ICD-10-CM | POA: Diagnosis not present

## 2023-10-02 DIAGNOSIS — S301XXD Contusion of abdominal wall, subsequent encounter: Secondary | ICD-10-CM | POA: Insufficient documentation

## 2023-10-02 DIAGNOSIS — R2689 Other abnormalities of gait and mobility: Secondary | ICD-10-CM | POA: Diagnosis not present

## 2023-10-02 DIAGNOSIS — Z743 Need for continuous supervision: Secondary | ICD-10-CM | POA: Diagnosis not present

## 2023-10-02 DIAGNOSIS — E871 Hypo-osmolality and hyponatremia: Secondary | ICD-10-CM | POA: Diagnosis not present

## 2023-10-02 DIAGNOSIS — R55 Syncope and collapse: Principal | ICD-10-CM | POA: Diagnosis present

## 2023-10-02 DIAGNOSIS — K4020 Bilateral inguinal hernia, without obstruction or gangrene, not specified as recurrent: Secondary | ICD-10-CM | POA: Diagnosis not present

## 2023-10-02 DIAGNOSIS — X58XXXD Exposure to other specified factors, subsequent encounter: Secondary | ICD-10-CM | POA: Insufficient documentation

## 2023-10-02 DIAGNOSIS — I2510 Atherosclerotic heart disease of native coronary artery without angina pectoris: Secondary | ICD-10-CM | POA: Diagnosis present

## 2023-10-02 DIAGNOSIS — Z7901 Long term (current) use of anticoagulants: Secondary | ICD-10-CM | POA: Insufficient documentation

## 2023-10-02 DIAGNOSIS — Z6833 Body mass index (BMI) 33.0-33.9, adult: Secondary | ICD-10-CM | POA: Diagnosis not present

## 2023-10-02 DIAGNOSIS — E66811 Obesity, class 1: Secondary | ICD-10-CM | POA: Insufficient documentation

## 2023-10-02 DIAGNOSIS — R404 Transient alteration of awareness: Secondary | ICD-10-CM | POA: Diagnosis not present

## 2023-10-02 DIAGNOSIS — I959 Hypotension, unspecified: Secondary | ICD-10-CM | POA: Diagnosis present

## 2023-10-02 DIAGNOSIS — N1832 Chronic kidney disease, stage 3b: Secondary | ICD-10-CM | POA: Insufficient documentation

## 2023-10-02 DIAGNOSIS — J9811 Atelectasis: Secondary | ICD-10-CM | POA: Diagnosis not present

## 2023-10-02 DIAGNOSIS — J4489 Other specified chronic obstructive pulmonary disease: Secondary | ICD-10-CM | POA: Insufficient documentation

## 2023-10-02 DIAGNOSIS — K5730 Diverticulosis of large intestine without perforation or abscess without bleeding: Secondary | ICD-10-CM | POA: Diagnosis not present

## 2023-10-02 DIAGNOSIS — J439 Emphysema, unspecified: Secondary | ICD-10-CM | POA: Diagnosis present

## 2023-10-02 LAB — HEPATIC FUNCTION PANEL
ALT: 19 U/L (ref 0–44)
AST: 49 U/L — ABNORMAL HIGH (ref 15–41)
Albumin: 3.1 g/dL — ABNORMAL LOW (ref 3.5–5.0)
Alkaline Phosphatase: 47 U/L (ref 38–126)
Bilirubin, Direct: 0.2 mg/dL (ref 0.0–0.2)
Indirect Bilirubin: 1.6 mg/dL — ABNORMAL HIGH (ref 0.3–0.9)
Total Bilirubin: 1.8 mg/dL — ABNORMAL HIGH (ref 0.0–1.2)
Total Protein: 5.5 g/dL — ABNORMAL LOW (ref 6.5–8.1)

## 2023-10-02 LAB — CBC WITH DIFFERENTIAL/PLATELET
Abs Immature Granulocytes: 0.2 K/uL — ABNORMAL HIGH (ref 0.00–0.07)
Basophils Absolute: 0.1 K/uL (ref 0.0–0.1)
Basophils Relative: 1 %
Eosinophils Absolute: 0.3 K/uL (ref 0.0–0.5)
Eosinophils Relative: 2 %
HCT: 35.2 % — ABNORMAL LOW (ref 39.0–52.0)
Hemoglobin: 11.2 g/dL — ABNORMAL LOW (ref 13.0–17.0)
Immature Granulocytes: 1 %
Lymphocytes Relative: 24 %
Lymphs Abs: 3.6 K/uL (ref 0.7–4.0)
MCH: 27.6 pg (ref 26.0–34.0)
MCHC: 31.8 g/dL (ref 30.0–36.0)
MCV: 86.7 fL (ref 80.0–100.0)
Monocytes Absolute: 1.2 K/uL — ABNORMAL HIGH (ref 0.1–1.0)
Monocytes Relative: 8 %
Neutro Abs: 9.4 K/uL — ABNORMAL HIGH (ref 1.7–7.7)
Neutrophils Relative %: 64 %
Platelets: 313 K/uL (ref 150–400)
RBC: 4.06 MIL/uL — ABNORMAL LOW (ref 4.22–5.81)
RDW: 15.1 % (ref 11.5–15.5)
WBC: 14.7 K/uL — ABNORMAL HIGH (ref 4.0–10.5)
nRBC: 0 % (ref 0.0–0.2)

## 2023-10-02 LAB — CBG MONITORING, ED: Glucose-Capillary: 109 mg/dL — ABNORMAL HIGH (ref 70–99)

## 2023-10-02 LAB — BASIC METABOLIC PANEL WITH GFR
Anion gap: 11 (ref 5–15)
BUN: 26 mg/dL — ABNORMAL HIGH (ref 8–23)
CO2: 21 mmol/L — ABNORMAL LOW (ref 22–32)
Calcium: 8.8 mg/dL — ABNORMAL LOW (ref 8.9–10.3)
Chloride: 96 mmol/L — ABNORMAL LOW (ref 98–111)
Creatinine, Ser: 3.93 mg/dL — ABNORMAL HIGH (ref 0.61–1.24)
GFR, Estimated: 15 mL/min — ABNORMAL LOW (ref >60)
Glucose, Bld: 108 mg/dL — ABNORMAL HIGH (ref 70–99)
Potassium: 4.1 mmol/L (ref 3.5–5.1)
Sodium: 128 mmol/L — ABNORMAL LOW (ref 135–145)

## 2023-10-02 MED ORDER — ROSUVASTATIN CALCIUM 20 MG PO TABS
40.0000 mg | ORAL_TABLET | Freq: Every evening | ORAL | Status: DC
  Administered 2023-10-02 – 2023-10-03 (×2): 40 mg via ORAL
  Filled 2023-10-02 (×3): qty 2

## 2023-10-02 MED ORDER — BUDESON-GLYCOPYRROL-FORMOTEROL 160-9-4.8 MCG/ACT IN AERO
2.0000 | INHALATION_SPRAY | Freq: Two times a day (BID) | RESPIRATORY_TRACT | Status: DC
  Administered 2023-10-02 – 2023-10-04 (×4): 2 via RESPIRATORY_TRACT
  Filled 2023-10-02: qty 5.9

## 2023-10-02 MED ORDER — ONDANSETRON HCL 4 MG PO TABS: 4.0000 mg | ORAL_TABLET | Freq: Four times a day (QID) | ORAL | Status: DC | PRN

## 2023-10-02 MED ORDER — ACETAMINOPHEN 325 MG PO TABS: 650.0000 mg | ORAL_TABLET | Freq: Four times a day (QID) | ORAL | Status: DC | PRN

## 2023-10-02 MED ORDER — BENZONATATE 100 MG PO CAPS: 100.0000 mg | ORAL_CAPSULE | Freq: Three times a day (TID) | ORAL | Status: DC | PRN

## 2023-10-02 MED ORDER — TRAZODONE HCL 50 MG PO TABS: 100.0000 mg | ORAL_TABLET | Freq: Every evening | ORAL | Status: DC | PRN

## 2023-10-02 MED ORDER — ALLOPURINOL 100 MG PO TABS
100.0000 mg | ORAL_TABLET | Freq: Every day | ORAL | Status: DC
  Administered 2023-10-02 – 2023-10-04 (×3): 100 mg via ORAL
  Filled 2023-10-02 (×3): qty 1

## 2023-10-02 MED ORDER — ONDANSETRON HCL 4 MG/2ML IJ SOLN: 4.0000 mg | Freq: Four times a day (QID) | INTRAMUSCULAR | Status: DC | PRN

## 2023-10-02 MED ORDER — LORATADINE 10 MG PO TABS
10.0000 mg | ORAL_TABLET | Freq: Every day | ORAL | Status: DC
Start: 2023-10-02 — End: 2023-10-04
  Administered 2023-10-02 – 2023-10-04 (×3): 10 mg via ORAL
  Filled 2023-10-02 (×3): qty 1

## 2023-10-02 MED ORDER — NOREPINEPHRINE 4 MG/250ML-% IV SOLN: 0.0000 ug/min | INTRAVENOUS | Status: DC

## 2023-10-02 MED ORDER — SODIUM CHLORIDE 0.9 % IV SOLN: INTRAVENOUS | Status: AC

## 2023-10-02 MED ORDER — SODIUM CHLORIDE 0.9 % IV BOLUS
1000.0000 mL | Freq: Once | INTRAVENOUS | Status: AC
  Administered 2023-10-02: 1000 mL via INTRAVENOUS

## 2023-10-02 MED ORDER — CYCLOBENZAPRINE HCL 5 MG PO TABS: 5.0000 mg | ORAL_TABLET | Freq: Three times a day (TID) | ORAL | Status: DC | PRN

## 2023-10-02 MED ORDER — SODIUM CHLORIDE 0.9% FLUSH
3.0000 mL | Freq: Two times a day (BID) | INTRAVENOUS | Status: DC
  Administered 2023-10-02 – 2023-10-03 (×2): 3 mL via INTRAVENOUS

## 2023-10-02 MED ORDER — SODIUM BICARBONATE 650 MG PO TABS
650.0000 mg | ORAL_TABLET | Freq: Two times a day (BID) | ORAL | Status: DC
  Administered 2023-10-02 – 2023-10-04 (×4): 650 mg via ORAL
  Filled 2023-10-02 (×4): qty 1

## 2023-10-02 NOTE — Assessment & Plan Note (Signed)
 Continue rosuvastatin  Hold metoprolol , Imdur  due to hypotension Hold aspirin  and Plavix  due to rectus sheath hematoma

## 2023-10-02 NOTE — Assessment & Plan Note (Signed)
Continue as needed bronchodilator therapy and inhaled steroids

## 2023-10-02 NOTE — Assessment & Plan Note (Signed)
 Secondary to ATN from hypotension At baseline patient has stage IIIb chronic kidney disease with a serum creatinine of 1.8, today on admission his creatinine is 3.93 Hold lisinopril , spironolactone , furosemide Avoid nephrotoxic agents Continue IV fluid resuscitation
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
## 2023-10-02 NOTE — Assessment & Plan Note (Signed)
 Secondary to severe coughing spells Patient presented for evaluation of abdominal pain and imaging shows a rectus sheath hematoma that is stable Hold antiplatelets for now
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
## 2023-10-02 NOTE — Assessment & Plan Note (Signed)
 Most likely secondary to hypovolemia as well as medication induced due to spironolactone  Continue IV fluid hydration Repeat electrolytes in a.m.

## 2023-10-02 NOTE — Assessment & Plan Note (Signed)
 Last known LVEF of 55% from a 2D echocardiogram which was done 02/25 Hold diuretics due to AKI and hypotension Hold Farxiga due to worsening renal function

## 2023-10-02 NOTE — Assessment & Plan Note (Signed)
 BMI 33 Complicates overall prognosis and care

## 2023-10-02 NOTE — Assessment & Plan Note (Signed)
 Most likely secondary to hypovolemia Patient was noted to have systolic blood pressure in the 70s and has responded appropriately to IV fluid resuscitation Hold all antihypertensive medications and diuretics Continue IV fluid resuscitation Repeat 2D echocardiogram to assess LVEF Cardiology consult

## 2023-10-03 ENCOUNTER — Observation Stay (HOSPITAL_BASED_OUTPATIENT_CLINIC_OR_DEPARTMENT_OTHER)

## 2023-10-03 DIAGNOSIS — R55 Syncope and collapse: Secondary | ICD-10-CM

## 2023-10-03 DIAGNOSIS — I5032 Chronic diastolic (congestive) heart failure: Secondary | ICD-10-CM | POA: Diagnosis not present

## 2023-10-03 DIAGNOSIS — N1831 Chronic kidney disease, stage 3a: Secondary | ICD-10-CM

## 2023-10-03 DIAGNOSIS — N179 Acute kidney failure, unspecified: Secondary | ICD-10-CM

## 2023-10-03 LAB — BASIC METABOLIC PANEL WITH GFR
Anion gap: 10 (ref 5–15)
BUN: 22 mg/dL (ref 8–23)
CO2: 20 mmol/L — ABNORMAL LOW (ref 22–32)
Calcium: 8.5 mg/dL — ABNORMAL LOW (ref 8.9–10.3)
Chloride: 102 mmol/L (ref 98–111)
Creatinine, Ser: 2.95 mg/dL — ABNORMAL HIGH (ref 0.61–1.24)
GFR, Estimated: 21 mL/min — ABNORMAL LOW (ref >60)
Glucose, Bld: 118 mg/dL — ABNORMAL HIGH (ref 70–99)
Potassium: 4.2 mmol/L (ref 3.5–5.1)
Sodium: 132 mmol/L — ABNORMAL LOW (ref 135–145)

## 2023-10-03 LAB — CBC
HCT: 35.1 % — ABNORMAL LOW (ref 39.0–52.0)
Hemoglobin: 11.2 g/dL — ABNORMAL LOW (ref 13.0–17.0)
MCH: 27.5 pg (ref 26.0–34.0)
MCHC: 31.9 g/dL (ref 30.0–36.0)
MCV: 86.2 fL (ref 80.0–100.0)
Platelets: 291 K/uL (ref 150–400)
RBC: 4.07 MIL/uL — ABNORMAL LOW (ref 4.22–5.81)
RDW: 15.4 % (ref 11.5–15.5)
WBC: 10.5 K/uL (ref 4.0–10.5)
nRBC: 0 % (ref 0.0–0.2)

## 2023-10-03 LAB — ECHOCARDIOGRAM COMPLETE
Area-P 1/2: 3.99 cm2
Calc EF: 54.8 %
Height: 64 in
S' Lateral: 3 cm
Single Plane A2C EF: 53.3 %
Single Plane A4C EF: 54.7 %
Weight: 3104.08 [oz_av]

## 2023-10-03 LAB — CBG MONITORING, ED: Glucose-Capillary: 113 mg/dL — ABNORMAL HIGH (ref 70–99)
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
## 2023-10-03 NOTE — Plan of Care (Signed)
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
## 2023-10-03 NOTE — Care Management Obs Status (Signed)
 MEDICARE OBSERVATION STATUS NOTIFICATION   Patient Details  Name: Gregory [name] MRN: [id number] Date of Birth: 04-12-1945   Medicare Observation Status Notification Given:   yes    Caliana Spires, RN 10/03/2023, 3:22 PM

## 2023-10-04 ENCOUNTER — Inpatient Hospital Stay (HOSPITAL_COMMUNITY): Admit: 2023-10-04 | Discharge: 2023-10-04 | Disposition: A | Discharge status: Home / Self Care | Attending: Cardiology

## 2023-10-04 ENCOUNTER — Other Ambulatory Visit: Payer: Self-pay | Admitting: Student

## 2023-10-04 DIAGNOSIS — R55 Syncope and collapse: Secondary | ICD-10-CM

## 2023-10-04 DIAGNOSIS — N179 Acute kidney failure, unspecified: Secondary | ICD-10-CM | POA: Diagnosis not present

## 2023-10-04 LAB — CBC
HCT: 36.6 % — ABNORMAL LOW (ref 39.0–52.0)
Hemoglobin: 11.7 g/dL — ABNORMAL LOW (ref 13.0–17.0)
MCH: 27.3 pg (ref 26.0–34.0)
MCHC: 32 g/dL (ref 30.0–36.0)
MCV: 85.5 fL (ref 80.0–100.0)
Platelets: 302 K/uL (ref 150–400)
RBC: 4.28 MIL/uL (ref 4.22–5.81)
RDW: 15.5 % (ref 11.5–15.5)
WBC: 12.3 K/uL — ABNORMAL HIGH (ref 4.0–10.5)
nRBC: 0 % (ref 0.0–0.2)

## 2023-10-04 LAB — BASIC METABOLIC PANEL WITH GFR
Anion gap: 8 (ref 5–15)
BUN: 17 mg/dL (ref 8–23)
CO2: 21 mmol/L — ABNORMAL LOW (ref 22–32)
Calcium: 9.1 mg/dL (ref 8.9–10.3)
Chloride: 108 mmol/L (ref 98–111)
Creatinine, Ser: 2.12 mg/dL — ABNORMAL HIGH (ref 0.61–1.24)
GFR, Estimated: 32 mL/min — ABNORMAL LOW (ref >60)
Glucose, Bld: 96 mg/dL (ref 70–99)
Potassium: 4.4 mmol/L (ref 3.5–5.1)
Sodium: 137 mmol/L (ref 135–145)

## 2023-10-04 LAB — GLUCOSE, CAPILLARY: Glucose-Capillary: 96 mg/dL (ref 70–99)

## 2023-10-04 NOTE — Progress Notes (Signed)
 Live 14 day Zio monitor for syncopy. Dr Lonni to read. Will follow up with Dr Fernand.  Signed,  Morse Clause, PA-C 10/04/2023, 9:04 AM

## 2023-10-04 NOTE — Plan of Care (Signed)
   Problem: Elimination: Goal: Will not experience complications related to bowel motility Outcome: Progressing   Problem: Pain Managment: Goal: General experience of comfort will improve and/or be controlled Outcome: Progressing   Problem: Safety: Goal: Ability to remain free from injury will improve Outcome: Progressing

## 2023-10-04 NOTE — Progress Notes (Signed)
   Rounding Note    Patient Name: Gregory [name] Date of Encounter: 10/04/2023  [facility] Cardiologist: Denyse Bathe, MD   Subjective   No acute events overnight. Cough improving. No lightheadedness/dizziness. He has an appt with Dr. Bathe in November, doesn't think he will be able to get a follow up visit sooner. We discuss event monitor placement and he is amenable.  Inpatient Medications    Scheduled Meds:  allopurinol   100 mg Oral Daily   budesonide -glycopyrrolate -formoterol   2 puff Inhalation BID   loratadine   10 mg Oral Daily   rosuvastatin   40 mg Oral QPM   sodium bicarbonate   650 mg Oral BID   sodium chloride  flush  3 mL Intravenous Q12H   Continuous Infusions:  PRN Meds: acetaminophen , benzonatate , cyclobenzaprine , ondansetron  **OR** ondansetron  (ZOFRAN ) IV, traZODone    Vital Signs    Vitals:   10/03/23 1952 10/04/23 0048 10/04/23 0550 10/04/23 0823  BP: (!) 141/76 (!) 146/73 107/69 (!) 147/77  Pulse: 99 93 81 91  Resp: 18 18 17 18   Temp: 98.2 F (36.8 C) 98.7 F (37.1 C) 98.6 F (37 C) 98.2 F (36.8 C)  TempSrc:  Oral Oral Oral  SpO2: 98% 98% 98% 96%  Weight:      Height:        Intake/Output Summary (Last 24 hours) at 10/04/2023 0850 Last data filed at 10/04/2023 0051 Gross per 24 hour  Intake 1434.55 ml  Output 200 ml  Net 1234.55 ml      10/02/2023    3:13 PM 09/29/2023    5:18 PM 09/08/2023    2:27 PM  Last 3 Weights  Weight (lbs) 194 lb 0.1 oz 194 lb 0.1 oz 193 lb 12.8 oz  Weight (kg) 88 kg 88 kg 87.907 kg      Telemetry    SR - Personally Reviewed  Physical Exam   GEN: No acute distress.   Neck: No JVD Cardiac: RRR, no murmurs, rubs, or gallops.  Respiratory: Clear to auscultation bilaterally. GI: Soft, nontender, non-distended  MS: No edema; No deformity. Neuro:  Nonfocal  Psych: Normal affect   New pertinent results (labs, ECG, imaging, cardiac studies)     Assessment & Plan    Syncope: -unclear etiology,  suspect 2/2 hypovolemia based on presentation -echo unremarkable -telemetry unremarkable -discussed event monitor. He is amenable. Will place 2 week live Zio before discharge -follow up with Dr. Bathe   Acute kidney injury on chronic kidney disease stage 3a -recent fluctuation, management per IM -Cr and Na improved with hydration, also supports hypovolemia   Chronic diastolic heart failure: does not appear volume up, if anything more consistent with volume depletion, as above  Cardiology will sign off. Will discuss timing of discharge with primary team, place event monitor. Patient has follow up scheduled with his primary cardiologist.    Signed, Shelda Bruckner, MD  10/04/2023, 8:50 AM
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
## 2023-10-04 NOTE — Discharge Summary (Signed)
 Physician Discharge Summary   Patient: Gregory [name] MRN: [id number] DOB: 09-10-1945  Admit date:     10/02/2023  Discharge date: 10/04/23  Discharge Physician: Deliliah Room   PCP: Albina GORMAN Dine, MD   Recommendations at discharge:    Follow up with your PCP in one week. Follow up with your cardiologist, Dr Fernand on the scheduled appointment. Continue taking meds as prescribed. Stay hydrated.  Discharge Diagnoses: Principal Problem:   Syncope and collapse Active Problems:   AKI (acute kidney injury) (HCC)   Hyponatremia   CAD, multiple vessel   Obesity (BMI 30-39.9)   Rectus sheath hematoma, subsequent encounter   COPD (chronic obstructive pulmonary disease) with emphysema (HCC)   Chronic diastolic CHF (congestive heart failure) [facility])   Hospital Course:  78 y.o. year old male with PMH of chronic diastolic dysfunction CHF, last known LVEF of 55% from a 2D Echocardiogram done 02/25, CAD, Gout, hypertension with stage 3b CKD who presents to the ER for evaluation after he had a witnessed syncopal episode.   Syncope Hypovolemic hypotension: Resolved Patient presenting with syncope with associated hypotension AKI suspect hypovolemia. ECHO done EF 60-65% with Grade I diastolic dynfunction. No significant valvular abnormalities seen. Cardiology was consulted  Live 14 day Zio was placed prior to discharge and patient will follow up with his own cardiologist, Dr Fernand in [place].   AKI on CKD 3B: back to baseline on discharge Metabolic acidosis: baseline creatinine 1.8-2.4 recently.   Hypovolemic hyponatremia Resolved   Chronic diastolic CHF: Last known LVEF of 55% from a 2D echocardiogram which was done 02/25 Resumed Farxiga and diuretics on discharge.   COPD Not wheezing.  Continue as needed bronchodilator therapy and inhaled steroids   Rectus sheath hematoma, subsequent encounter Secondary to severe coughing spells.  Recently seen in the ED for the same.     CAD, multiple vessel Continue with home meds on discharge.   Class I Obesity w/ Body mass index is 33.3 kg/m.: Will benefit with PCP follow-up, weight loss,healthy lifestyle and outpatient sleep eval if not done.      Consultants: Cardiology Procedures performed: None  Disposition: Home health Diet recommendation:  Cardiac diet DISCHARGE MEDICATION: Allergies as of 10/04/2023   No Known Allergies      Medication List     TAKE these medications    acetaminophen  500 MG tablet Commonly known as: TYLENOL  Take 1,500 mg by mouth every 6 (six) hours as needed for moderate pain.   allopurinol  100 MG tablet Commonly known as: ZYLOPRIM  Take 1 tablet by mouth once daily   aspirin  81 MG chewable tablet Chew 1 tablet (81 mg total) by mouth daily.   Azelastine  HCl 137 MCG/SPRAY Soln Place 1 spray into the nose daily.   benzonatate  100 MG capsule Commonly known as: TESSALON  Take 1 capsule by mouth three times daily as needed for cough   Breztri  Aerosphere 160-9-4.8 MCG/ACT Aero inhaler Generic drug: budesonide -glycopyrrolate -formoterol  INHALE 2 PUFFS INTO LUNGS TWICE DAILY   cetirizine 10 MG tablet Commonly known as: ZYRTEC TAKE 1 TABLET BY MOUTH ONCE DAILY IN THE MORNING   clopidogrel  75 MG tablet Commonly known as: PLAVIX  Take 1 tablet by mouth once daily   Colchicine 0.6 MG Caps TAKE 2 CAPSULES BY MOUTH FOR THE FIRST DOSE, AND 1 CAPSULE ONE HOUR LATER. THEN TAKE 1 CAPSULE ONCE DAILY UNTIL  PAIN  FREE.   cyclobenzaprine  5 MG tablet Commonly known as: FLEXERIL  Take 1 tablet by mouth three times daily as needed  for muscle spasm   Farxiga 5 MG Tabs tablet Generic drug: dapagliflozin propanediol Take 5 mg by mouth daily.   fluticasone  50 MCG/ACT nasal spray Commonly known as: FLONASE Place 1 spray into both nostrils 2 (two) times daily.   furosemide 20 MG tablet Commonly known as: LASIX Take 1 tablet by mouth once daily   gabapentin  300 MG capsule Commonly  known as: Neurontin  Take 1 capsule (300 mg total) by mouth 3 (three) times daily.   isosorbide  mononitrate 30 MG 24 hr tablet Commonly known as: IMDUR  Take 1 tablet by mouth once daily   lisinopril  40 MG tablet Commonly known as: ZESTRIL  Take 1 tablet by mouth once daily   metoprolol  tartrate 25 MG tablet Commonly known as: LOPRESSOR  Take 0.5 tablets (12.5 mg total) by mouth 2 (two) times daily.   rosuvastatin  40 MG tablet Commonly known as: CRESTOR  TAKE 1 TABLET BY MOUTH ONCE DAILY IN THE EVENING   sodium bicarbonate  650 MG tablet Take 1 tablet by mouth twice daily   spironolactone  25 MG tablet Commonly known as: ALDACTONE  Take 1 tablet by mouth once daily   traZODone  100 MG tablet Commonly known as: DESYREL  TAKE 1 TABLET BY MOUTH AT BEDTIME AS NEEDED FOR SLEEP        Follow-up Information     Albina GORMAN Dine, MD. Schedule an appointment as soon as possible for a visit in 1 week(s).   Specialty: Internal Medicine Contact information: [address] [place] [address] [phone]         Fernand Denyse LABOR, MD. Call.   Specialty: Cardiology Contact information: [address] [place] [address] [phone]                Discharge Exam: Fredricka Weights   10/02/23 1513  Weight: 88 kg   Constitutional: NAD, calm, comfortable Eyes: PERRL, lids and conjunctivae normal ENMT: Mucous membranes are moist. Posterior pharynx clear of any exudate or lesions.Normal dentition.  Neck: normal, supple, no masses, no thyromegaly Respiratory: clear to auscultation bilaterally, no wheezing, no crackles. Normal respiratory effort. No accessory muscle use.  Cardiovascular: Regular rate and rhythm, no murmurs / rubs / gallops. No extremity edema. 2+ pedal pulses. No carotid bruits.  Abdomen: no tenderness, no masses palpated. No hepatosplenomegaly. Bowel sounds positive.  Musculoskeletal: no clubbing / cyanosis. No joint deformity upper and lower extremities.  Good ROM, no contractures. Normal muscle tone.  Skin: no rashes, lesions, ulcers. No induration Neurologic: CN 2-12 grossly intact. Sensation intact, DTR normal. Strength 5/5 x all 4 extremities.  Psychiatric: Normal judgment and insight. Alert and oriented x 3. Normal mood.    Condition at discharge: good  The results of significant diagnostics from this hospitalization (including imaging, microbiology, ancillary and laboratory) are listed below for reference.   Imaging Studies: ECHOCARDIOGRAM COMPLETE Result Date: 10/03/2023    ECHOCARDIOGRAM REPORT   Patient Name:   Gregory [name] Date of Exam: 10/03/2023 Medical Rec #:  [id number]        Height:       64.0 in Accession #:    [id number]       Weight:       194.0 lb Date of Birth:  [birth year]         BSA:          1.931 m Patient Age:    78 years         BP:           121/61 mmHg Patient  Gender: M                HR:           80 bpm. Exam Location:  Inpatient Procedure: 2D Echo, Cardiac Doppler and Color Doppler (Both Spectral and Color            Flow Doppler were utilized during procedure). Indications:    Syncope  History:        Patient has prior history of Echocardiogram examinations, most                 recent 09/01/2020. CHF, CAD, COPD, Signs/Symptoms:Syncope and                 Shortness of Breath; Risk Factors:Dyslipidemia, Former Smoker                 and Hypertension.  Sonographer:    Juliene Rucks Referring Phys: AIMEE SOMERSET  Sonographer Comments: Image acquisition challenging due to patient body habitus, Image acquisition challenging due to COPD and Image acquisition challenging due to respiratory motion. IMPRESSIONS  1. Left ventricular ejection fraction, by estimation, is 60 to 65%. The left ventricle has normal function. Left ventricular endocardial border not optimally defined to evaluate regional wall motion. There is mild concentric left ventricular hypertrophy. Left ventricular diastolic parameters are consistent with Grade I  diastolic dysfunction (impaired relaxation).  2. Right ventricular systolic function is normal. The right ventricular size is normal.  3. The mitral valve is normal in structure. No evidence of mitral valve regurgitation. No evidence of mitral stenosis.  4. The aortic valve is grossly normal. Aortic valve regurgitation is not visualized. No aortic stenosis is present. FINDINGS  Left Ventricle: Left ventricular ejection fraction, by estimation, is 60 to 65%. The left ventricle has normal function. Left ventricular endocardial border not optimally defined to evaluate regional wall motion. The left ventricular internal cavity size was normal in size. There is mild concentric left ventricular hypertrophy. Left ventricular diastolic parameters are consistent with Grade I diastolic dysfunction (impaired relaxation). Right Ventricle: The right ventricular size is normal. No increase in right ventricular wall thickness. Right ventricular systolic function is normal. Left Atrium: Left atrial size was normal in size. Right Atrium: Right atrial size was normal in size. Pericardium: There is no evidence of pericardial effusion. Mitral Valve: The mitral valve is normal in structure. No evidence of mitral valve regurgitation. No evidence of mitral valve stenosis. Tricuspid Valve: The tricuspid valve is grossly normal. Tricuspid valve regurgitation is not demonstrated. Aortic Valve: The aortic valve is grossly normal. Aortic valve regurgitation is not visualized. No aortic stenosis is present. Pulmonic Valve: The pulmonic valve was not well visualized. Aorta: The aortic root is normal in size and structure. Venous: The inferior vena cava was not well visualized. IAS/Shunts: No atrial level shunt detected by color flow Doppler.  LEFT VENTRICLE PLAX 2D LVIDd:         4.10 cm     Diastology LVIDs:         3.00 cm     LV e' medial:    4.35 cm/s LV PW:         1.30 cm     LV E/e' medial:  18.2 LV IVS:        1.40 cm     LV e' lateral:    7.07 cm/s LVOT diam:     2.20 cm     LV E/e' lateral: 11.2 LV SV:  81 LV SV Index:   42 LVOT Area:     3.80 cm  LV Volumes (MOD) LV vol d, MOD A2C: 63.0 ml LV vol d, MOD A4C: 71.8 ml LV vol s, MOD A2C: 29.4 ml LV vol s, MOD A4C: 32.5 ml LV SV MOD A2C:     33.6 ml LV SV MOD A4C:     71.8 ml LV SV MOD BP:      37.5 ml RIGHT VENTRICLE RV Basal diam:  3.70 cm RV Mid diam:    3.00 cm RV S prime:     21.80 cm/s TAPSE (M-mode): 1.9 cm LEFT ATRIUM             Index        RIGHT ATRIUM           Index LA diam:        3.90 cm 2.02 cm/m   RA Area:     12.90 cm LA Vol (A2C):   38.9 ml 20.14 ml/m  RA Volume:   30.90 ml  16.00 ml/m LA Vol (A4C):   35.4 ml 18.33 ml/m LA Biplane Vol: 37.6 ml 19.47 ml/m  AORTIC VALVE LVOT Vmax:   121.00 cm/s LVOT Vmean:  81.100 cm/s LVOT VTI:    0.212 m  AORTA Ao Root diam: 2.70 cm MITRAL VALVE MV Area (PHT): 3.99 cm     SHUNTS MV Decel Time: 190 msec     Systemic VTI:  0.21 m MV E velocity: 79.10 cm/s   Systemic Diam: 2.20 cm MV A velocity: 103.00 cm/s MV E/A ratio:  0.77 Mihai Croitoru MD Electronically signed by Jerel Balding MD Signature Date/Time: 10/03/2023/3:33:56 PM    Final    CT CHEST ABDOMEN PELVIS WO CONTRAST Result Date: 10/02/2023 CLINICAL DATA:  Hypotension EXAM: CT CHEST, ABDOMEN AND PELVIS WITHOUT CONTRAST TECHNIQUE: Multidetector CT imaging of the chest, abdomen and pelvis was performed following the standard protocol without IV contrast. RADIATION DOSE REDUCTION: This exam was performed according to the departmental dose-optimization program which includes automated exposure control, adjustment of the mA and/or kV according to patient size and/or use of iterative reconstruction technique. COMPARISON:  09/29/2023 abdominal CT FINDINGS: CT CHEST FINDINGS Cardiovascular: Heart is normal size. Aorta is normal caliber. Coronary artery and aortic atherosclerosis. Mediastinum/Nodes: No mediastinal, hilar, or axillary adenopathy. Trachea and esophagus are unremarkable.  Thyroid unremarkable. Lungs/Pleura: Bronchial wall thickening in the lower lobes. Bibasilar atelectasis. No confluent opacities or effusions. Musculoskeletal: Chest wall soft tissues are unremarkable. No acute bony abnormality. CT ABDOMEN PELVIS FINDINGS Hepatobiliary: No focal hepatic abnormality. Gallbladder unremarkable. Pancreas: No focal abnormality or ductal dilatation. Spleen: No focal abnormality.  Normal size. Adrenals/Urinary Tract: No suspicious renal or adrenal abnormality. No stones or hydronephrosis. Urinary bladder unremarkable. Stomach/Bowel: Normal appendix. Few scattered sigmoid diverticula. No active diverticulitis. Stomach and small bowel decompressed. No bowel obstruction or inflammatory process. Vascular/Lymphatic: Aortic atherosclerosis. No evidence of aneurysm or adenopathy. Reproductive: Prominent prostate. Other: No free fluid or free air. Musculoskeletal: Small bilateral inguinal hernias containing fat. Again noted is enlargement of the right rectus muscle compatible with intramuscular hematoma, measuring approximately 4.6 x 2.5 cm, not significantly changed since prior study. No acute bony abnormality. IMPRESSION: Mild airway thickening in the lower lobes with bibasilar atelectasis. Coronary artery disease, aortic atherosclerosis. Stable small right rectus muscle hematoma. Small bilateral inguinal hernias containing fat. Prostate enlargement. No acute findings in the abdomen or pelvis. Electronically Signed   By: Franky Crease M.D.   On: 10/02/2023  18:23   CT ABDOMEN PELVIS WO CONTRAST Result Date: 09/29/2023 CLINICAL DATA:  Acute abdominal pain, right-sided abdominal bruising EXAM: CT ABDOMEN AND PELVIS WITHOUT CONTRAST TECHNIQUE: Multidetector CT imaging of the abdomen and pelvis was performed following the standard protocol without IV contrast. RADIATION DOSE REDUCTION: This exam was performed according to the departmental dose-optimization program which includes automated exposure  control, adjustment of the mA and/or kV according to patient size and/or use of iterative reconstruction technique. COMPARISON:  09/29/2022 FINDINGS: Lower chest: Minimal ground-glass opacities within the dependent left lower lobe may be hypoventilatory, inflammatory, or infectious. Hepatobiliary: Unremarkable unenhanced appearance of the liver and gallbladder. No biliary duct dilation. Pancreas: Unremarkable unenhanced appearance. Spleen: Unremarkable unenhanced appearance. Adrenals/Urinary Tract: No urinary tract calculi or obstructive uropathy within either kidney. 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 No specific imaging follow-up is recommended. The adrenals are unremarkable. There is mild diffuse bladder wall thickening, which may reflect sequela of chronic bladder outlet obstruction in a patient with an enlarged prostate. Stomach/Bowel: No bowel obstruction or ileus. Normal appendix right lower quadrant. Distal colonic diverticulosis without diverticulitis. No bowel wall thickening or inflammatory change. Vascular/Lymphatic: Aortic atherosclerosis. No enlarged abdominal or pelvic lymph nodes. Reproductive: Stable enlargement of the prostate. Other: No free fluid or free intraperitoneal gas. Moderate fat containing left inguinal hernia unchanged. Musculoskeletal: There is enlargement and increased attenuation within the superior aspect of the right rectus musculature, measuring approximately 3.0 x 4.9 x 4.5 cm reference image 46/2, compatible with intramuscular hematoma. There is overlying subcutaneous fat stranding within the right anterolateral abdominal wall at this level, compatible with given history of ecchymosis. There are no acute displaced fractures. Reconstructed images demonstrate no additional findings. IMPRESSION: 1. Intramuscular hematoma involving the right rectus musculature, with overlying subcutaneous fat stranding  consistent with given history of right abdominal wall ecchymosis. 2. Enlarged prostate, with mild diffuse bladder wall thickening likely reflecting sequela of chronic bladder outlet obstruction. 3. Distal colonic diverticulosis without diverticulitis. 4.  Aortic Atherosclerosis (ICD10-I70.0). Electronically Signed   By: Ozell Daring M.D.   On: 09/29/2023 20:28    Microbiology: Results for orders placed or performed during the hospital encounter of 08/31/20  Resp Panel by RT-PCR (Flu A&B, Covid) Nasopharyngeal Swab     Status: None   Collection Time: 08/31/20  4:23 PM   Specimen: Nasopharyngeal Swab; Nasopharyngeal(NP) swabs in vial transport medium  Result Value Ref Range Status   SARS Coronavirus 2 by RT PCR NEGATIVE NEGATIVE Final    Comment: (NOTE) SARS-CoV-2 target nucleic acids are NOT DETECTED.  The SARS-CoV-2 RNA is generally detectable in upper respiratory specimens during the acute phase of infection. The lowest concentration of SARS-CoV-2 viral copies this assay can detect is 138 copies/mL. A negative result does not preclude SARS-Cov-2 infection and should not be used as the sole basis for treatment or other patient management decisions. A negative result may occur with  improper specimen collection/handling, submission of specimen other than nasopharyngeal swab, presence of viral mutation(s) within the areas targeted by this assay, and inadequate number of viral copies(<138 copies/mL). A negative result must be combined with clinical observations, patient history, and epidemiological information. The expected result is Negative.  Fact Sheet for Patients:  BloggerCourse.com  Fact Sheet for Healthcare Providers:  SeriousBroker.it  This test is no t yet approved or cleared by the [place]  FDA and  has been authorized for detection and/or diagnosis of SARS-CoV-2 by FDA  under an Emergency Use Authorization (EUA). This  EUA will remain  in effect (meaning this test can be used) for the duration of the COVID-19 declaration under Section 564(b)(1) of the Act, 21 U.S.C.section 360bbb-3(b)(1), unless the authorization is terminated  or revoked sooner.       Influenza A by PCR NEGATIVE NEGATIVE Final   Influenza B by PCR NEGATIVE NEGATIVE Final    Comment: (NOTE) The Xpert Xpress SARS-CoV-2/FLU/RSV plus assay is intended as an aid in the diagnosis of influenza from Nasopharyngeal swab specimens and should not be used as a sole basis for treatment. Nasal washings and aspirates are unacceptable for Xpert Xpress SARS-CoV-2/FLU/RSV testing.  Fact Sheet for Patients: BloggerCourse.com  Fact Sheet for Healthcare Providers: SeriousBroker.it  This test is not yet approved or cleared by the [place]  FDA and has been authorized for detection and/or diagnosis of SARS-CoV-2 by FDA under an Emergency Use Authorization (EUA). This EUA will remain in effect (meaning this test can be used) for the duration of the COVID-19 declaration under Section 564(b)(1) of the Act, 21 U.S.C. section 360bbb-3(b)(1), unless the authorization is terminated or revoked.  Performed at [facility], [address] [place], [place] [address]     Labs: CBC: Recent Labs  Lab 09/29/23 1730 10/02/23 1605 10/03/23 0456 10/04/23 0157  WBC 13.8* 14.7* 10.5 12.3*  NEUTROABS  --  9.4*  --   --   HGB 13.3 11.2* 11.2* 11.7*  HCT 41.0 35.2* 35.1* 36.6*  MCV 86.3 86.7 86.2 85.5  PLT 310 313 291 302   Basic Metabolic Panel: Recent Labs  Lab 09/29/23 1730 10/02/23 1605 10/03/23 0456 10/04/23 0321  NA 136 128* 132* 137  K 4.5 4.1 4.2 4.4  CL 101 96* 102 108  CO2 19* 21* 20* 21*  GLUCOSE 109* 108* 118* 96  BUN 23 26* 22 17  CREATININE 2.45* 3.93* 2.95* 2.12*  CALCIUM  9.6 8.8* 8.5* 9.1   Liver Function Tests: Recent Labs  Lab 09/29/23 1730 10/02/23 1605  AST 44* 49*   ALT 19 19  ALKPHOS 61 47  BILITOT 1.7* 1.8*  PROT 7.5 5.5*  ALBUMIN 3.8 3.1*   CBG: Recent Labs  Lab 10/02/23 1607 10/03/23 0456 10/04/23 0548  GLUCAP 109* 113* 96    Discharge time spent: 43 minutes.  Signed: Deliliah Room, MD [facility] 10/04/2023

## 2023-10-05 DIAGNOSIS — I129 Hypertensive chronic kidney disease with stage 1 through stage 4 chronic kidney disease, or unspecified chronic kidney disease: Secondary | ICD-10-CM | POA: Diagnosis not present

## 2023-10-05 DIAGNOSIS — R809 Proteinuria, unspecified: Secondary | ICD-10-CM | POA: Diagnosis not present

## 2023-10-05 DIAGNOSIS — N1832 Chronic kidney disease, stage 3b: Secondary | ICD-10-CM | POA: Diagnosis not present

## 2023-10-05 DIAGNOSIS — E8722 Chronic metabolic acidosis: Secondary | ICD-10-CM | POA: Diagnosis not present

## 2023-10-05 DIAGNOSIS — N281 Cyst of kidney, acquired: Secondary | ICD-10-CM | POA: Diagnosis not present

## 2023-10-09 ENCOUNTER — Other Ambulatory Visit: Payer: Self-pay | Admitting: Internal Medicine

## 2023-10-09 DIAGNOSIS — J301 Allergic rhinitis due to pollen: Secondary | ICD-10-CM

## 2023-10-09 DIAGNOSIS — G4700 Insomnia, unspecified: Secondary | ICD-10-CM

## 2023-10-17 ENCOUNTER — Ambulatory Visit (INDEPENDENT_AMBULATORY_CARE_PROVIDER_SITE_OTHER): Admitting: Internal Medicine

## 2023-10-17 VITALS — BP 110/64 | HR 76 | Temp 98.1°F | Ht 64.0 in | Wt 194.8 lb

## 2023-10-17 DIAGNOSIS — E785 Hyperlipidemia, unspecified: Secondary | ICD-10-CM | POA: Diagnosis not present

## 2023-10-17 DIAGNOSIS — G4700 Insomnia, unspecified: Secondary | ICD-10-CM | POA: Diagnosis not present

## 2023-10-17 DIAGNOSIS — N179 Acute kidney failure, unspecified: Secondary | ICD-10-CM | POA: Diagnosis not present

## 2023-10-17 DIAGNOSIS — R55 Syncope and collapse: Secondary | ICD-10-CM

## 2023-10-17 DIAGNOSIS — Z0130 Encounter for examination of blood pressure without abnormal findings: Secondary | ICD-10-CM

## 2023-10-17 LAB — POCT URINALYSIS DIPSTICK

## 2023-10-17 MED ORDER — BELSOMRA 10 MG PO TABS: 10.0000 mg | ORAL_TABLET | Freq: Every day | ORAL | 2 refills | Status: DC

## 2023-10-17 NOTE — Progress Notes (Signed)
 Established Patient Office Visit  Subjective:  Patient ID: Gregory [name], male    DOB: 06-12-1945  Age: 78 y.o. MRN: [id number]  Chief Complaint  Patient presents with   Follow-up    Follow up    Hospital follow up for syncope and AKI. No further syncopal episodes and still wearing a holter monitor.    No other concerns at this time.   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   Pneumothorax    Associated with pneumonia    Past Surgical History:  Procedure Laterality Date   Arm surgery     CARDIAC CATHETERIZATION N/A 12/05/2014   Procedure: Left Heart Cath and Coronary Angiography;  Surgeon: Victory LELON Sharps, MD;  Location: [facility] INVASIVE CV LAB;  Service: Cardiovascular;  Laterality: N/A;   CATARACT EXTRACTION W/PHACO Right 01/14/2013   Procedure: RIGHT EYE CATARACT EXTRACTION PHACO AND INTRAOCULAR LENS PLACEMENT ;  Surgeon: Cherene Mania, MD;  Location: [facility];  Service: Ophthalmology;  Laterality: Right;  CDE 54.97   CATARACT EXTRACTION W/PHACO Left 02/18/2013   Procedure: CATARACT EXTRACTION PHACO AND INTRAOCULAR LENS PLACEMENT (IOC);  Surgeon: Cherene Mania, MD;  Location: [facility];  Service: Ophthalmology;  Laterality: Left;  CDE 34.02   COLONOSCOPY WITH PROPOFOL  N/A 07/13/2016   Procedure: COLONOSCOPY WITH PROPOFOL ;  Surgeon: Viktoria Lamar DASEN, MD;  Location: [facility] ENDOSCOPY;  Service: Endoscopy;  Laterality: N/A;   INGUINAL HERNIA REPAIR Right 09/09/2012   Procedure: HERNIA REPAIR INGUINAL INCARCERATED;  Surgeon: Alm VEAR Angle, MD;  Location: [facility];  Service: General;  Laterality: Right;   INSERTION OF MESH Right 09/09/2012   Procedure: INSERTION OF MESH;  Surgeon: Alm VEAR Angle, MD;  Location: [facility];  Service: General;  Laterality: Right;   LEFT HEART CATH AND CORONARY ANGIOGRAPHY Right 09/21/2021   Procedure: LEFT HEART CATH AND CORONARY ANGIOGRAPHY with intervention;  Surgeon: Fernand Denyse LABOR, MD;  Location: [facility] INVASIVE CV LAB;  Service: Cardiovascular;  Laterality: Right;    Social History   Socioeconomic History   Marital status: Widowed    Spouse name: Not on file   Number of children: 2   Years of education: Not on file   Highest education level: Not on file  Occupational History   Not on file  Tobacco Use   Smoking status: Former    Current packs/day: 0.00    Types: Cigarettes    Quit date: 01/28/1978    Years since quitting: 45.7   Smokeless tobacco: Never  Vaping Use   Vaping status: Never Used  Substance and Sexual Activity   Alcohol use: No    Alcohol/week: 0.0 standard drinks of alcohol   Drug use: No   Sexual activity: Not on file  Other Topics Concern   Not on file  Social History Narrative   Wife passed approx. 4 years ago. Has 1 living daughter. Lives by himself now.    Social Drivers of Corporate investment banker Strain: Not on file  Food Insecurity: No Food Insecurity (10/03/2023)   Hunger Vital Sign    Worried About Running Out of Food in the Last Year: Never true    Ran Out of Food in the Last Year: Never true  Transportation Needs: No Transportation Needs (10/03/2023)   PRAPARE - Administrator, Civil Service (Medical): No    Lack of Transportation (Non-Medical): No  Physical Activity: Not on file  Stress: Not on file  Social Connections: Unknown (10/03/2023)   Social Connection and Isolation Panel    Frequency of Communication with Friends and Family: Twice a week    Frequency of Social Gatherings with Friends and Family: Twice a week    Attends Religious Services: Not on Marketing executive or Organizations: Not on file    Attends Banker Meetings: 1 to 4 times per year    Marital Status: Widowed  Intimate Partner Violence: Not At Risk (10/03/2023)   Humiliation, Afraid, Rape, and Kick questionnaire    Fear of Current or Ex-Partner: No    Emotionally Abused: No    Physically Abused: No     Sexually Abused: No    Family History  Problem Relation Age of Onset   COPD Father    Stroke Brother    Stroke Son     No Known Allergies  Outpatient Medications Prior to Visit  Medication Sig   acetaminophen  (TYLENOL ) 500 MG tablet Take 1,500 mg by mouth every 6 (six) hours as needed for moderate pain.   allopurinol  (ZYLOPRIM ) 100 MG tablet Take 1 tablet by mouth once daily   aspirin  81 MG chewable tablet Chew 1 tablet (81 mg total) by mouth daily.   Azelastine  HCl 137 MCG/SPRAY SOLN Place 1 spray into the nose daily.   benzonatate  (TESSALON ) 100 MG capsule Take 1 capsule by mouth three times daily as needed for cough   BREZTRI  AEROSPHERE 160-9-4.8 MCG/ACT AERO inhaler INHALE 2 PUFFS INTO LUNGS TWICE DAILY   cetirizine (ZYRTEC) 10 MG tablet TAKE 1 TABLET BY MOUTH ONCE DAILY IN THE MORNING   clopidogrel  (PLAVIX ) 75 MG tablet Take 1 tablet by mouth once daily   Colchicine 0.6 MG CAPS TAKE 2 CAPSULES BY MOUTH FOR THE FIRST DOSE, THEN 1 CAPSULE ONE HOUR LATER. TAKE 1 CAPSULE ONCE DAILY UNTIL PAIN FREE   cyclobenzaprine  (FLEXERIL ) 5 MG tablet Take 1 tablet by mouth three times daily as needed for muscle spasm   dapagliflozin propanediol (FARXIGA) 5 MG TABS tablet Take 5 mg by mouth daily.   fluticasone  (FLONASE) 50 MCG/ACT nasal spray Place 1 spray into both nostrils 2 (two) times daily.   furosemide (LASIX) 20 MG tablet Take 1 tablet by mouth once daily   gabapentin  (NEURONTIN ) 300 MG capsule Take 1 capsule (300 mg total) by mouth 3 (three) times daily.   isosorbide  mononitrate (IMDUR ) 30 MG 24 hr tablet Take 1 tablet by mouth once daily   lisinopril  (ZESTRIL ) 40 MG tablet Take 1 tablet by mouth once daily   metoprolol  tartrate (LOPRESSOR ) 25 MG tablet Take 0.5 tablets (12.5 mg total) by mouth 2 (two) times daily.   rosuvastatin  (CRESTOR ) 40 MG tablet TAKE 1 TABLET BY MOUTH ONCE DAILY IN THE EVENING   sodium bicarbonate  650 MG tablet Take 1 tablet by mouth twice daily   spironolactone   (ALDACTONE ) 25 MG tablet Take 1 tablet by mouth once daily   traZODone  (DESYREL ) 100 MG tablet TAKE 1 TABLET BY MOUTH AT BEDTIME AS NEEDED FOR SLEEP   No facility-administered medications prior to visit.    Review of Systems  Constitutional: Negative.   HENT: Negative.    Eyes: Negative.   Respiratory: Negative.    Cardiovascular: Negative.   Gastrointestinal: Negative.   Genitourinary:  Positive for frequency. Negative for urgency.  Musculoskeletal:  Positive for back pain and joint pain.  Skin: Negative.   Neurological:  Negative.   Endo/Heme/Allergies: Negative.        Objective:   BP 110/64   Pulse 76   Temp 98.1 F (36.7 C)   Ht 5' 4 (1.626 m)   Wt 194 lb 12.8 oz (88.4 kg)   SpO2 97%   BMI 33.44 kg/m   Vitals:   10/17/23 1348  BP: 110/64  Pulse: 76  Temp: 98.1 F (36.7 C)  Height: 5' 4 (1.626 m)  Weight: 194 lb 12.8 oz (88.4 kg)  SpO2: 97%  BMI (Calculated): 33.42    Physical Exam Vitals reviewed.  Constitutional:      Appearance: Normal appearance.  HENT:     Head: Normocephalic.     Left Ear: There is no impacted cerumen.     Nose: Nose normal.     Mouth/Throat:     Mouth: Mucous membranes are moist.     Pharynx: No posterior oropharyngeal erythema.  Eyes:     Extraocular Movements: Extraocular movements intact.     Pupils: Pupils are equal, round, and reactive to light.  Cardiovascular:     Rate and Rhythm: Regular rhythm.     Chest Wall: PMI is not displaced.     Pulses: Normal pulses.     Heart sounds: Normal heart sounds. No murmur heard. Pulmonary:     Effort: Pulmonary effort is normal.     Breath sounds: Normal air entry. No rhonchi or rales.  Abdominal:     General: Abdomen is flat. Bowel sounds are normal. There is no distension.     Palpations: Abdomen is soft. There is no hepatomegaly, splenomegaly or mass.     Tenderness: There is no abdominal tenderness.  Musculoskeletal:        General: Normal range of motion.      Cervical back: Normal range of motion and neck supple.     Right lower leg: No edema.     Left lower leg: No edema.  Skin:    General: Skin is warm and dry.  Neurological:     General: No focal deficit present.     Mental Status: He is alert and oriented to person, place, and time.     Cranial Nerves: No cranial nerve deficit.     Sensory: Sensory deficit present.     Motor: No weakness.  Psychiatric:        Mood and Affect: Mood normal.        Behavior: Behavior normal.      Results for orders placed or performed in visit on 10/17/23  POCT urinalysis dipstick  Result Value Ref Range   Color, UA     Clarity, UA     Glucose, UA Negative Negative   Bilirubin, UA Negative    Ketones, UA Negative    Spec Grav, UA 1.010 1.010 - 1.025   Blood, UA Negative    pH, UA 6.0 5.0 - 8.0   Protein, UA Negative Negative   Urobilinogen, UA 0.2 0.2 or 1.0 E.U./dL   Nitrite, UA Negative    Leukocytes, UA Large (3+) (A) Negative   Appearance     Odor      Recent Results (from the past 2160 hours)  Basic metabolic panel     Status: Abnormal   Collection Time: 07/21/23  8:37 PM  Result Value Ref Range   Sodium 133 (L) 135 - 145 mmol/L   Potassium 5.1 3.5 - 5.1 mmol/L   Chloride 102 98 - 111 mmol/L   CO2 20 (  L) 22 - 32 mmol/L   Glucose, Bld 101 (H) 70 - 99 mg/dL    Comment: Glucose reference range applies only to samples taken after fasting for at least 8 hours.   BUN 31 (H) 8 - 23 mg/dL   Creatinine, Ser 8.11 (H) 0.61 - 1.24 mg/dL   Calcium  9.3 8.9 - 10.3 mg/dL   GFR, Estimated 37 (L) >60 mL/min    Comment: (NOTE) Calculated using the CKD-EPI Creatinine Equation (2021)    Anion gap 11 5 - 15    Comment: Performed at [facility], [address] [place], [place] [address]  CBC     Status: Abnormal   Collection Time: 07/21/23  8:37 PM  Result Value Ref Range   WBC 12.0 (H) 4.0 - 10.5 K/uL   RBC 4.64 4.22 - 5.81 MIL/uL   Hemoglobin 13.9 13.0 - 17.0 g/dL   HCT 57.4 60.9 - 47.9 %    MCV 91.6 80.0 - 100.0 fL   MCH 30.0 26.0 - 34.0 pg   MCHC 32.7 30.0 - 36.0 g/dL   RDW 86.1 88.4 - 84.4 %   Platelets 268 150 - 400 K/uL   nRBC 0.0 0.0 - 0.2 %    Comment: Performed at [facility], [address] [place], [place] [address]  Lipase, blood     Status: None   Collection Time: 09/29/23  5:30 PM  Result Value Ref Range   Lipase 36 11 - 51 U/L    Comment: Performed at [facility], [address] [place], [place] [address]  Comprehensive metabolic panel     Status: Abnormal   Collection Time: 09/29/23  5:30 PM  Result Value Ref Range   Sodium 136 135 - 145 mmol/L   Potassium 4.5 3.5 - 5.1 mmol/L   Chloride 101 98 - 111 mmol/L   CO2 19 (L) 22 - 32 mmol/L   Glucose, Bld 109 (H) 70 - 99 mg/dL    Comment: Glucose reference range applies only to samples taken after fasting for at least 8 hours.   BUN 23 8 - 23 mg/dL   Creatinine, Ser 7.54 (H) 0.61 - 1.24 mg/dL   Calcium  9.6 8.9 - 10.3 mg/dL   Total Protein 7.5 6.5 - 8.1 g/dL   Albumin 3.8 3.5 - 5.0 g/dL   AST 44 (H) 15 - 41 U/L   ALT 19 0 - 44 U/L   Alkaline Phosphatase 61 38 - 126 U/L   Total Bilirubin 1.7 (H) 0.0 - 1.2 mg/dL   GFR, Estimated 27 (L) >60 mL/min    Comment: (NOTE) Calculated using the CKD-EPI Creatinine Equation (2021)    Anion gap 16 (H) 5 - 15    Comment: Performed at [facility] [address] [place], [place] [address]  CBC     Status: Abnormal   Collection Time: 09/29/23  5:30 PM  Result Value Ref Range   WBC 13.8 (H) 4.0 - 10.5 K/uL   RBC 4.75 4.22 - 5.81 MIL/uL   Hemoglobin 13.3 13.0 - 17.0 g/dL   HCT 58.9 60.9 - 47.9 %   MCV 86.3 80.0 - 100.0 fL   MCH 28.0 26.0 - 34.0 pg   MCHC 32.4 30.0 - 36.0 g/dL   RDW 85.0 88.4 - 84.4 %   Platelets 310 150 - 400 K/uL   nRBC 0.0 0.0 - 0.2 %    Comment: Performed at [facility], [address] [place], [place] [address]  Urinalysis, Routine w reflex microscopic -  Urine, Clean Catch     Status: Abnormal   Collection Time: 09/29/23  8:28 PM   Result Value Ref Range   Color, Urine YELLOW YELLOW   APPearance CLEAR CLEAR   Specific Gravity, Urine 1.013 1.005 - 1.030   pH 5.0 5.0 - 8.0   Glucose, UA >=500 (A) NEGATIVE mg/dL   Hgb urine dipstick MODERATE (A) NEGATIVE   Bilirubin Urine NEGATIVE NEGATIVE   Ketones, ur NEGATIVE NEGATIVE mg/dL   Protein, ur 30 (A) NEGATIVE mg/dL   Nitrite NEGATIVE NEGATIVE   Leukocytes,Ua NEGATIVE NEGATIVE   RBC / HPF 0-5 0 - 5 RBC/hpf   WBC, UA 0-5 0 - 5 WBC/hpf   Bacteria, UA NONE SEEN NONE SEEN   Squamous Epithelial / HPF 0-5 0 - 5 /HPF    Comment: Performed at [facility], [address], [place], [place] [address]  Basic metabolic panel     Status: Abnormal   Collection Time: 10/02/23  4:05 PM  Result Value Ref Range   Sodium 128 (L) 135 - 145 mmol/L   Potassium 4.1 3.5 - 5.1 mmol/L   Chloride 96 (L) 98 - 111 mmol/L   CO2 21 (L) 22 - 32 mmol/L   Glucose, Bld 108 (H) 70 - 99 mg/dL    Comment: Glucose reference range applies only to samples taken after fasting for at least 8 hours.   BUN 26 (H) 8 - 23 mg/dL   Creatinine, Ser 6.06 (H) 0.61 - 1.24 mg/dL   Calcium  8.8 (L) 8.9 - 10.3 mg/dL   GFR, Estimated 15 (L) >60 mL/min    Comment: (NOTE) Calculated using the CKD-EPI Creatinine Equation (2021)    Anion gap 11 5 - 15    Comment: Performed at [facility], [address], [place], [place] [address]  CBC WITH DIFFERENTIAL     Status: Abnormal   Collection Time: 10/02/23  4:05 PM  Result Value Ref Range   WBC 14.7 (H) 4.0 - 10.5 K/uL   RBC 4.06 (L) 4.22 - 5.81 MIL/uL   Hemoglobin 11.2 (L) 13.0 - 17.0 g/dL   HCT 64.7 (L) 60.9 - 47.9 %   MCV 86.7 80.0 - 100.0 fL   MCH 27.6 26.0 - 34.0 pg   MCHC 31.8 30.0 - 36.0 g/dL   RDW 84.8 88.4 - 84.4 %   Platelets 313 150 - 400 K/uL   nRBC 0.0 0.0 - 0.2 %   Neutrophils Relative % 64 %   Neutro Abs 9.4 (H) 1.7 - 7.7 K/uL   Lymphocytes Relative 24 %   Lymphs Abs 3.6 0.7 - 4.0 K/uL   Monocytes Relative 8 %   Monocytes Absolute 1.2 (H)  0.1 - 1.0 K/uL   Eosinophils Relative 2 %   Eosinophils Absolute 0.3 0.0 - 0.5 K/uL   Basophils Relative 1 %   Basophils Absolute 0.1 0.0 - 0.1 K/uL   Immature Granulocytes 1 %   Abs Immature Granulocytes 0.20 (H) 0.00 - 0.07 K/uL    Comment: Performed at [facility], [address] [place], [place] [address]  Hepatic function panel     Status: Abnormal   Collection Time: 10/02/23  4:05 PM  Result Value Ref Range   Total Protein 5.5 (L) 6.5 - 8.1 g/dL   Albumin 3.1 (L) 3.5 - 5.0 g/dL   AST 49 (H) 15 - 41 U/L   ALT 19 0 - 44 U/L   Alkaline Phosphatase 47 38 - 126 U/L  Total Bilirubin 1.8 (H) 0.0 - 1.2 mg/dL   Bilirubin, Direct 0.2 0.0 - 0.2 mg/dL   Indirect Bilirubin 1.6 (H) 0.3 - 0.9 mg/dL    Comment: Performed at [facility], [address], [place], [place] [address]  CBG monitoring, ED     Status: Abnormal   Collection Time: 10/02/23  4:07 PM  Result Value Ref Range   Glucose-Capillary 109 (H) 70 - 99 mg/dL    Comment: Glucose reference range applies only to samples taken after fasting for at least 8 hours.  Basic metabolic panel     Status: Abnormal   Collection Time: 10/03/23  4:56 AM  Result Value Ref Range   Sodium 132 (L) 135 - 145 mmol/L   Potassium 4.2 3.5 - 5.1 mmol/L    Comment: HEMOLYSIS AT THIS LEVEL MAY AFFECT RESULT   Chloride 102 98 - 111 mmol/L   CO2 20 (L) 22 - 32 mmol/L   Glucose, Bld 118 (H) 70 - 99 mg/dL    Comment: Glucose reference range applies only to samples taken after fasting for at least 8 hours.   BUN 22 8 - 23 mg/dL   Creatinine, Ser 7.04 (H) 0.61 - 1.24 mg/dL   Calcium  8.5 (L) 8.9 - 10.3 mg/dL   GFR, Estimated 21 (L) >60 mL/min    Comment: (NOTE) Calculated using the CKD-EPI Creatinine Equation (2021)    Anion gap 10 5 - 15    Comment: Performed at [facility], [address], [place], [place] [address]  CBC     Status: Abnormal   Collection Time: 10/03/23  4:56 AM  Result Value Ref Range   WBC 10.5 4.0 - 10.5 K/uL    RBC 4.07 (L) 4.22 - 5.81 MIL/uL   Hemoglobin 11.2 (L) 13.0 - 17.0 g/dL   HCT 64.8 (L) 60.9 - 47.9 %   MCV 86.2 80.0 - 100.0 fL   MCH 27.5 26.0 - 34.0 pg   MCHC 31.9 30.0 - 36.0 g/dL   RDW 84.5 88.4 - 84.4 %   Platelets 291 150 - 400 K/uL   nRBC 0.0 0.0 - 0.2 %    Comment: Performed at [facility], [address] [place], [place] [address]  CBG monitoring, ED     Status: Abnormal   Collection Time: 10/03/23  4:56 AM  Result Value Ref Range   Glucose-Capillary 113 (H) 70 - 99 mg/dL    Comment: Glucose reference range applies only to samples taken after fasting for at least 8 hours.  ECHOCARDIOGRAM COMPLETE     Status: None   Collection Time: 10/03/23  2:35 PM  Result Value Ref Range   Weight 3,104.08 oz   Height 64 in   BP 127/66 mmHg   Single Plane A2C EF 53.3 %   Single Plane A4C EF 54.7 %   Calc EF 54.8 %   Gaylia Kassel' Lateral 3.00 cm   Area-P 1/2 3.99 cm2   Est EF 60 - 65%   CBC     Status: Abnormal   Collection Time: 10/04/23  1:57 AM  Result Value Ref Range   WBC 12.3 (H) 4.0 - 10.5 K/uL   RBC 4.28 4.22 - 5.81 MIL/uL   Hemoglobin 11.7 (L) 13.0 - 17.0 g/dL   HCT 63.3 (L) 60.9 - 47.9 %   MCV 85.5 80.0 - 100.0 fL   MCH 27.3 26.0 - 34.0 pg   MCHC 32.0 30.0 - 36.0 g/dL   RDW  15.5 11.5 - 15.5 %   Platelets 302 150 - 400 K/uL   nRBC 0.0 0.0 - 0.2 %    Comment: Performed at [facility], [address], [place], [place] [address]  Basic metabolic panel with GFR     Status: Abnormal   Collection Time: 10/04/23  3:21 AM  Result Value Ref Range   Sodium 137 135 - 145 mmol/L   Potassium 4.4 3.5 - 5.1 mmol/L   Chloride 108 98 - 111 mmol/L   CO2 21 (L) 22 - 32 mmol/L   Glucose, Bld 96 70 - 99 mg/dL    Comment: Glucose reference range applies only to samples taken after fasting for at least 8 hours.   BUN 17 8 - 23 mg/dL   Creatinine, Ser 7.87 (H) 0.61 - 1.24 mg/dL   Calcium  9.1 8.9 - 10.3 mg/dL   GFR, Estimated 32 (L) >60 mL/min    Comment: (NOTE) Calculated using  the CKD-EPI Creatinine Equation (2021)    Anion gap 8 5 - 15    Comment: Performed at [facility], [address], [place], [place] [address]  Glucose, capillary     Status: None   Collection Time: 10/04/23  5:48 AM  Result Value Ref Range   Glucose-Capillary 96 70 - 99 mg/dL    Comment: Glucose reference range applies only to samples taken after fasting for at least 8 hours.   Comment 1 Notify RN   POCT urinalysis dipstick     Status: Abnormal   Collection Time: 10/17/23  2:13 PM  Result Value Ref Range   Color, UA     Clarity, UA     Glucose, UA Negative Negative   Bilirubin, UA Negative    Ketones, UA Negative    Spec Grav, UA 1.010 1.010 - 1.025   Blood, UA Negative    pH, UA 6.0 5.0 - 8.0   Protein, UA Negative Negative   Urobilinogen, UA 0.2 0.2 or 1.0 E.U./dL   Nitrite, UA Negative    Leukocytes, UA Large (3+) (A) Negative   Appearance     Odor        Assessment & Plan:  Benjamin was seen today for follow-up.  Insomnia, unspecified type -     Belsomra ; Take 1 tablet (10 mg total) by mouth daily.  Dispense: 30 tablet; Refill: 2 -     POCT urinalysis dipstick  Syncope, unspecified syncope type  Neurocardiogenic syncope -     Comprehensive metabolic panel with GFR -     CBC With Diff/Platelet  Dyslipidemia -     Lipid panel    Problem List Items Addressed This Visit       Other   Dyslipidemia   Relevant Orders   Lipid panel   Syncope   Insomnia - Primary   Relevant Medications   Suvorexant  (BELSOMRA ) 10 MG TABS   Other Relevant Orders   POCT urinalysis dipstick (Completed)   Other Visit Diagnoses       Neurocardiogenic syncope       Relevant Orders   Comprehensive metabolic panel with GFR   CBC With Diff/Platelet       Return in about 1 month (around 11/16/2023) for fu with labs prior.   Total time spent: 30 minutes  Sherrill Cinderella Perry, MD  10/17/2023   This document may have been prepared by [facility] Voice Recognition  software and as such may include unintentional dictation errors.

## 2023-10-21 ENCOUNTER — Other Ambulatory Visit: Payer: Self-pay | Admitting: Internal Medicine

## 2023-10-24 ENCOUNTER — Other Ambulatory Visit: Payer: Self-pay | Admitting: Internal Medicine

## 2023-10-24 DIAGNOSIS — R202 Paresthesia of skin: Secondary | ICD-10-CM

## 2023-10-30 NOTE — Addendum Note (Signed)
 Encounter addended by: Malvina Pina A on: 10/30/2023 11:29 AM  Actions taken: Imaging Exam ended

## 2023-10-31 ENCOUNTER — Other Ambulatory Visit: Payer: Self-pay | Admitting: Internal Medicine

## 2023-10-31 ENCOUNTER — Other Ambulatory Visit: Payer: Self-pay | Admitting: Cardiology

## 2023-10-31 DIAGNOSIS — M47816 Spondylosis without myelopathy or radiculopathy, lumbar region: Secondary | ICD-10-CM

## 2023-10-31 DIAGNOSIS — N184 Chronic kidney disease, stage 4 (severe): Secondary | ICD-10-CM

## 2023-10-31 DIAGNOSIS — E785 Hyperlipidemia, unspecified: Secondary | ICD-10-CM

## 2023-11-17 ENCOUNTER — Ambulatory Visit: Admitting: Internal Medicine

## 2023-11-17 VITALS — BP 144/66 | HR 78 | Ht 64.0 in | Wt 200.0 lb

## 2023-11-17 DIAGNOSIS — N184 Chronic kidney disease, stage 4 (severe): Secondary | ICD-10-CM | POA: Diagnosis not present

## 2023-11-17 DIAGNOSIS — R202 Paresthesia of skin: Secondary | ICD-10-CM | POA: Diagnosis not present

## 2023-11-17 DIAGNOSIS — G5601 Carpal tunnel syndrome, right upper limb: Secondary | ICD-10-CM

## 2023-11-17 DIAGNOSIS — I10 Essential (primary) hypertension: Secondary | ICD-10-CM | POA: Diagnosis not present

## 2023-11-17 DIAGNOSIS — E785 Hyperlipidemia, unspecified: Secondary | ICD-10-CM | POA: Diagnosis not present

## 2023-11-17 DIAGNOSIS — Z23 Encounter for immunization: Secondary | ICD-10-CM | POA: Diagnosis not present

## 2023-11-17 DIAGNOSIS — M47816 Spondylosis without myelopathy or radiculopathy, lumbar region: Secondary | ICD-10-CM | POA: Diagnosis not present

## 2023-11-17 DIAGNOSIS — M1A40X0 Other secondary chronic gout, unspecified site, without tophus (tophi): Secondary | ICD-10-CM

## 2023-11-17 DIAGNOSIS — J438 Other emphysema: Secondary | ICD-10-CM | POA: Diagnosis not present

## 2023-11-17 DIAGNOSIS — J301 Allergic rhinitis due to pollen: Secondary | ICD-10-CM | POA: Diagnosis not present

## 2023-11-17 DIAGNOSIS — F5101 Primary insomnia: Secondary | ICD-10-CM

## 2023-11-17 MED ORDER — CETIRIZINE HCL 10 MG PO TABS: 10.0000 mg | ORAL_TABLET | Freq: Every morning | ORAL | 0 refills | Status: DC

## 2023-11-17 MED ORDER — BELSOMRA 15 MG PO TABS: 1.0000 | ORAL_TABLET | Freq: Every evening | ORAL | 1 refills | Status: DC

## 2023-11-17 MED ORDER — ROSUVASTATIN CALCIUM 40 MG PO TABS
40.0000 mg | ORAL_TABLET | Freq: Every evening | ORAL | 0 refills | Status: DC
Start: 2023-11-17 — End: 2023-12-05

## 2023-11-17 MED ORDER — AZELASTINE HCL 137 MCG/SPRAY NA SOLN: 1.0000 | Freq: Every day | NASAL | 2 refills | Status: AC

## 2023-11-17 MED ORDER — LISINOPRIL 40 MG PO TABS: 40.0000 mg | ORAL_TABLET | Freq: Every day | ORAL | 0 refills | Status: AC

## 2023-11-17 MED ORDER — CYCLOBENZAPRINE HCL 5 MG PO TABS: 5.0000 mg | ORAL_TABLET | Freq: Three times a day (TID) | ORAL | 1 refills | Status: AC | PRN

## 2023-11-17 MED ORDER — SODIUM BICARBONATE 650 MG PO TABS: 650.0000 mg | ORAL_TABLET | Freq: Two times a day (BID) | ORAL | 2 refills | Status: AC

## 2023-11-17 MED ORDER — BREZTRI AEROSPHERE 160-9-4.8 MCG/ACT IN AERO: 2.0000 | INHALATION_SPRAY | Freq: Two times a day (BID) | RESPIRATORY_TRACT | 2 refills | Status: DC

## 2023-11-17 MED ORDER — ALLOPURINOL 100 MG PO TABS: 100.0000 mg | ORAL_TABLET | Freq: Every day | ORAL | 0 refills | Status: DC

## 2023-11-17 NOTE — Progress Notes (Signed)
 Established Patient Office Visit  Subjective:  Patient ID: Gregory [name], male    DOB: [birth year]  Age: 78 y.o. MRN: [id number]  Chief Complaint  Patient presents with   Follow-up    1 month follow up    No new complaints, here for lab review and medication refills. Insomnia hasn't improved with current dose of Belsomra . Failed to have previsit labs done. Still c/o paresthesia of right hand.     No other concerns at this time.   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   Pneumothorax    Associated with pneumonia    Past Surgical History:  Procedure Laterality Date   Arm surgery     CARDIAC CATHETERIZATION N/A 12/05/2014   Procedure: Left Heart Cath and Coronary Angiography;  Surgeon: Victory LELON Sharps, MD;  Location: [facility] INVASIVE CV LAB;  Service: Cardiovascular;  Laterality: N/A;   CATARACT EXTRACTION W/PHACO Right 01/14/2013   Procedure: RIGHT EYE CATARACT EXTRACTION PHACO AND INTRAOCULAR LENS PLACEMENT ;  Surgeon: Cherene Mania, MD;  Location: [facility];  Service: Ophthalmology;  Laterality: Right;  CDE 54.97   CATARACT EXTRACTION W/PHACO Left 02/18/2013   Procedure: CATARACT EXTRACTION PHACO AND INTRAOCULAR LENS PLACEMENT (IOC);  Surgeon: Cherene Mania, MD;  Location: [facility];  Service: Ophthalmology;  Laterality: Left;  CDE 34.02   COLONOSCOPY WITH PROPOFOL  N/A 07/13/2016   Procedure: COLONOSCOPY WITH PROPOFOL ;  Surgeon: Viktoria Lamar DASEN, MD;  Location: [facility] ENDOSCOPY;  Service: Endoscopy;  Laterality: N/A;   INGUINAL HERNIA REPAIR Right 09/09/2012   Procedure: HERNIA REPAIR INGUINAL INCARCERATED;  Surgeon: Alm VEAR Angle, MD;  Location: [facility];  Service: General;  Laterality: Right;   INSERTION OF MESH Right 09/09/2012   Procedure: INSERTION OF MESH;  Surgeon: Alm VEAR Angle, MD;  Location: [facility];  Service: General;  Laterality: Right;   LEFT HEART CATH AND CORONARY ANGIOGRAPHY Right  09/21/2021   Procedure: LEFT HEART CATH AND CORONARY ANGIOGRAPHY with intervention;  Surgeon: Fernand Denyse LABOR, MD;  Location: [facility] INVASIVE CV LAB;  Service: Cardiovascular;  Laterality: Right;    Social History   Socioeconomic History   Marital status: Widowed    Spouse name: Not on file   Number of children: 2   Years of education: Not on file   Highest education level: Not on file  Occupational History   Not on file  Tobacco Use   Smoking status: Former    Current packs/day: 0.00    Types: Cigarettes    Quit date: 01/28/1978    Years since quitting: 45.8   Smokeless tobacco: Never  Vaping Use   Vaping status: Never Used  Substance and Sexual Activity   Alcohol use: No    Alcohol/week: 0.0 standard drinks of alcohol   Drug use: No   Sexual activity: Not on file  Other Topics Concern   Not on file  Social History Narrative   Wife passed approx. 4 years ago. Has 1 living daughter. Lives by himself now.    Social Drivers of Corporate investment banker Strain: Not on file  Food Insecurity: No Food Insecurity (10/03/2023)   Hunger Vital Sign    Worried About Running Out of Food in the Last Year: Never true    Ran Out of Food in the Last Year: Never true  Transportation Needs: No Transportation Needs (10/03/2023)   PRAPARE - Transportation  Lack of Transportation (Medical): No    Lack of Transportation (Non-Medical): No  Physical Activity: Not on file  Stress: Not on file  Social Connections: Unknown (10/03/2023)   Social Connection and Isolation Panel    Frequency of Communication with Friends and Family: Twice a week    Frequency of Social Gatherings with Friends and Family: Twice a week    Attends Religious Services: Not on Marketing executive or Organizations: Not on file    Attends Banker Meetings: 1 to 4 times per year    Marital Status: Widowed  Intimate Partner Violence: Not At Risk (10/03/2023)   Humiliation, Afraid, Rape, and Kick  questionnaire    Fear of Current or Ex-Partner: No    Emotionally Abused: No    Physically Abused: No    Sexually Abused: No    Family History  Problem Relation Age of Onset   COPD Father    Stroke Brother    Stroke Son     No Known Allergies  Outpatient Medications Prior to Visit  Medication Sig   acetaminophen  (TYLENOL ) 500 MG tablet Take 1,500 mg by mouth every 6 (six) hours as needed for moderate pain.   aspirin  81 MG chewable tablet Chew 1 tablet (81 mg total) by mouth daily.   benzonatate  (TESSALON ) 100 MG capsule Take 1 capsule by mouth three times daily as needed for cough   clopidogrel  (PLAVIX ) 75 MG tablet Take 1 tablet by mouth once daily   Colchicine 0.6 MG CAPS TAKE 2 CAPSULES BY MOUTH FOR THE FIRST DOSE, THEN TAKE 1 CAPSULE ONE HOUR LATER, THEN TAKE 1 CAPSULE ONCE DAILY UNTIL  PAIN  FREE   dapagliflozin propanediol (FARXIGA) 5 MG TABS tablet Take 5 mg by mouth daily.   fluticasone  (FLONASE) 50 MCG/ACT nasal spray Place 1 spray into both nostrils 2 (two) times daily.   furosemide (LASIX) 20 MG tablet Take 1 tablet by mouth once daily   gabapentin  (NEURONTIN ) 300 MG capsule TAKE 1 CAPSULE BY MOUTH THREE TIMES DAILY   isosorbide  mononitrate (IMDUR ) 30 MG 24 hr tablet Take 1 tablet by mouth once daily   metoprolol  tartrate (LOPRESSOR ) 25 MG tablet Take 0.5 tablets (12.5 mg total) by mouth 2 (two) times daily.   spironolactone  (ALDACTONE ) 25 MG tablet Take 1 tablet by mouth once daily   [DISCONTINUED] allopurinol  (ZYLOPRIM ) 100 MG tablet Take 1 tablet by mouth once daily   [DISCONTINUED] Azelastine  HCl 137 MCG/SPRAY SOLN Place 1 spray into the nose daily.   [DISCONTINUED] BREZTRI  AEROSPHERE 160-9-4.8 MCG/ACT AERO inhaler INHALE 2 PUFFS INTO LUNGS TWICE DAILY   [DISCONTINUED] cetirizine (ZYRTEC) 10 MG tablet TAKE 1 TABLET BY MOUTH ONCE DAILY IN THE MORNING   [DISCONTINUED] cyclobenzaprine  (FLEXERIL ) 5 MG tablet Take 1 tablet by mouth three times daily as needed for muscle  spasm   [DISCONTINUED] lisinopril  (ZESTRIL ) 40 MG tablet Take 1 tablet by mouth once daily   [DISCONTINUED] rosuvastatin  (CRESTOR ) 40 MG tablet TAKE 1 TABLET BY MOUTH ONCE DAILY IN THE EVENING   [DISCONTINUED] sodium bicarbonate  650 MG tablet Take 1 tablet by mouth twice daily   [DISCONTINUED] Suvorexant  (BELSOMRA ) 10 MG TABS Take 1 tablet (10 mg total) by mouth daily.   No facility-administered medications prior to visit.    Review of Systems  Constitutional: Negative.   HENT: Negative.    Eyes: Negative.   Respiratory: Negative.    Cardiovascular: Negative.   Gastrointestinal: Negative.   Genitourinary:  Positive  for frequency. Negative for urgency.  Musculoskeletal:  Positive for back pain and joint pain.  Skin: Negative.   Neurological: Negative.   Endo/Heme/Allergies: Negative.        Objective:   BP (!) 144/66   Pulse 78   Ht 5' 4 (1.626 m)   Wt 200 lb (90.7 kg)   SpO2 97%   BMI 34.33 kg/m   Vitals:   11/17/23 1421  BP: (!) 144/66  Pulse: 78  Height: 5' 4 (1.626 m)  Weight: 200 lb (90.7 kg)  SpO2: 97%  BMI (Calculated): 34.31    Physical Exam Vitals reviewed.  Constitutional:      Appearance: Normal appearance.  HENT:     Head: Normocephalic.     Left Ear: There is no impacted cerumen.     Nose: Nose normal.     Mouth/Throat:     Mouth: Mucous membranes are moist.     Pharynx: No posterior oropharyngeal erythema.  Eyes:     Extraocular Movements: Extraocular movements intact.     Pupils: Pupils are equal, round, and reactive to light.  Cardiovascular:     Rate and Rhythm: Regular rhythm.     Chest Wall: PMI is not displaced.     Pulses: Normal pulses.     Heart sounds: Normal heart sounds. No murmur heard. Pulmonary:     Effort: Pulmonary effort is normal.     Breath sounds: Normal air entry. No rhonchi or rales.  Abdominal:     General: Abdomen is flat. Bowel sounds are normal. There is no distension.     Palpations: Abdomen is soft. There  is no hepatomegaly, splenomegaly or mass.     Tenderness: There is no abdominal tenderness.  Musculoskeletal:        General: Normal range of motion.     Cervical back: Normal range of motion and neck supple.     Right lower leg: No edema.     Left lower leg: No edema.  Skin:    General: Skin is warm and dry.  Neurological:     General: No focal deficit present.     Mental Status: He is alert and oriented to person, place, and time.     Cranial Nerves: No cranial nerve deficit.     Sensory: Sensory deficit present.     Motor: No weakness.  Psychiatric:        Mood and Affect: Mood normal.        Behavior: Behavior normal.      No results found for any visits on 11/17/23.  Recent Results (from the past 2160 hours)  Lipase, blood     Status: None   Collection Time: 09/29/23  5:30 PM  Result Value Ref Range   Lipase 36 11 - 51 U/L    Comment: Performed at [facility], [address], [place], [place] [address]  Comprehensive metabolic panel     Status: Abnormal   Collection Time: 09/29/23  5:30 PM  Result Value Ref Range   Sodium 136 135 - 145 mmol/L   Potassium 4.5 3.5 - 5.1 mmol/L   Chloride 101 98 - 111 mmol/L   CO2 19 (L) 22 - 32 mmol/L   Glucose, Bld 109 (H) 70 - 99 mg/dL    Comment: Glucose reference range applies only to samples taken after fasting for at least 8 hours.   BUN 23 8 - 23 mg/dL   Creatinine, Ser 7.54 (H) 0.61 - 1.24 mg/dL   Calcium   9.6 8.9 - 10.3 mg/dL   Total Protein 7.5 6.5 - 8.1 g/dL   Albumin 3.8 3.5 - 5.0 g/dL   AST 44 (H) 15 - 41 U/L   ALT 19 0 - 44 U/L   Alkaline Phosphatase 61 38 - 126 U/L   Total Bilirubin 1.7 (H) 0.0 - 1.2 mg/dL   GFR, Estimated 27 (L) >60 mL/min    Comment: (NOTE) Calculated using the CKD-EPI Creatinine Equation (2021)    Anion gap 16 (H) 5 - 15    Comment: Performed at [facility], [address] [place], [place] [address]  CBC     Status: Abnormal   Collection Time: 09/29/23  5:30 PM  Result Value Ref Range    WBC 13.8 (H) 4.0 - 10.5 K/uL   RBC 4.75 4.22 - 5.81 MIL/uL   Hemoglobin 13.3 13.0 - 17.0 g/dL   HCT 58.9 60.9 - 47.9 %   MCV 86.3 80.0 - 100.0 fL   MCH 28.0 26.0 - 34.0 pg   MCHC 32.4 30.0 - 36.0 g/dL   RDW 85.0 88.4 - 84.4 %   Platelets 310 150 - 400 K/uL   nRBC 0.0 0.0 - 0.2 %    Comment: Performed at [facility], [address], [place], [place] [address]  Urinalysis, Routine w reflex microscopic -Urine, Clean Catch     Status: Abnormal   Collection Time: 09/29/23  8:28 PM  Result Value Ref Range   Color, Urine YELLOW YELLOW   APPearance CLEAR CLEAR   Specific Gravity, Urine 1.013 1.005 - 1.030   pH 5.0 5.0 - 8.0   Glucose, UA >=500 (A) NEGATIVE mg/dL   Hgb urine dipstick MODERATE (A) NEGATIVE   Bilirubin Urine NEGATIVE NEGATIVE   Ketones, ur NEGATIVE NEGATIVE mg/dL   Protein, ur 30 (A) NEGATIVE mg/dL   Nitrite NEGATIVE NEGATIVE   Leukocytes,Ua NEGATIVE NEGATIVE   RBC / HPF 0-5 0 - 5 RBC/hpf   WBC, UA 0-5 0 - 5 WBC/hpf   Bacteria, UA NONE SEEN NONE SEEN   Squamous Epithelial / HPF 0-5 0 - 5 /HPF    Comment: Performed at [facility], [address] [place], [place] [address]  Basic metabolic panel     Status: Abnormal   Collection Time: 10/02/23  4:05 PM  Result Value Ref Range   Sodium 128 (L) 135 - 145 mmol/L   Potassium 4.1 3.5 - 5.1 mmol/L   Chloride 96 (L) 98 - 111 mmol/L   CO2 21 (L) 22 - 32 mmol/L   Glucose, Bld 108 (H) 70 - 99 mg/dL    Comment: Glucose reference range applies only to samples taken after fasting for at least 8 hours.   BUN 26 (H) 8 - 23 mg/dL   Creatinine, Ser 6.06 (H) 0.61 - 1.24 mg/dL   Calcium  8.8 (L) 8.9 - 10.3 mg/dL   GFR, Estimated 15 (L) >60 mL/min    Comment: (NOTE) Calculated using the CKD-EPI Creatinine Equation (2021)    Anion gap 11 5 - 15    Comment: Performed at [facility], [address], [place], [place] [address]  CBC WITH DIFFERENTIAL     Status: Abnormal   Collection Time: 10/02/23  4:05 PM  Result Value Ref Range    WBC 14.7 (H) 4.0 - 10.5 K/uL   RBC 4.06 (L) 4.22 - 5.81 MIL/uL   Hemoglobin 11.2 (L) 13.0 - 17.0 g/dL   HCT 64.7 (L) 60.9 - 47.9 %   MCV  86.7 80.0 - 100.0 fL   MCH 27.6 26.0 - 34.0 pg   MCHC 31.8 30.0 - 36.0 g/dL   RDW 84.8 88.4 - 84.4 %   Platelets 313 150 - 400 K/uL   nRBC 0.0 0.0 - 0.2 %   Neutrophils Relative % 64 %   Neutro Abs 9.4 (H) 1.7 - 7.7 K/uL   Lymphocytes Relative 24 %   Lymphs Abs 3.6 0.7 - 4.0 K/uL   Monocytes Relative 8 %   Monocytes Absolute 1.2 (H) 0.1 - 1.0 K/uL   Eosinophils Relative 2 %   Eosinophils Absolute 0.3 0.0 - 0.5 K/uL   Basophils Relative 1 %   Basophils Absolute 0.1 0.0 - 0.1 K/uL   Immature Granulocytes 1 %   Abs Immature Granulocytes 0.20 (H) 0.00 - 0.07 K/uL    Comment: Performed at [facility], [address] [place], [place] [address]  Hepatic function panel     Status: Abnormal   Collection Time: 10/02/23  4:05 PM  Result Value Ref Range   Total Protein 5.5 (L) 6.5 - 8.1 g/dL   Albumin 3.1 (L) 3.5 - 5.0 g/dL   AST 49 (H) 15 - 41 U/L   ALT 19 0 - 44 U/L   Alkaline Phosphatase 47 38 - 126 U/L   Total Bilirubin 1.8 (H) 0.0 - 1.2 mg/dL   Bilirubin, Direct 0.2 0.0 - 0.2 mg/dL   Indirect Bilirubin 1.6 (H) 0.3 - 0.9 mg/dL    Comment: Performed at [facility], [address] [place], [place] [address]  CBG monitoring, ED     Status: Abnormal   Collection Time: 10/02/23  4:07 PM  Result Value Ref Range   Glucose-Capillary 109 (H) 70 - 99 mg/dL    Comment: Glucose reference range applies only to samples taken after fasting for at least 8 hours.  Basic metabolic panel     Status: Abnormal   Collection Time: 10/03/23  4:56 AM  Result Value Ref Range   Sodium 132 (L) 135 - 145 mmol/L   Potassium 4.2 3.5 - 5.1 mmol/L    Comment: HEMOLYSIS AT THIS LEVEL MAY AFFECT RESULT   Chloride 102 98 - 111 mmol/L   CO2 20 (L) 22 - 32 mmol/L   Glucose, Bld 118 (H) 70 - 99 mg/dL    Comment: Glucose reference range applies only to samples  taken after fasting for at least 8 hours.   BUN 22 8 - 23 mg/dL   Creatinine, Ser 7.04 (H) 0.61 - 1.24 mg/dL   Calcium  8.5 (L) 8.9 - 10.3 mg/dL   GFR, Estimated 21 (L) >60 mL/min    Comment: (NOTE) Calculated using the CKD-EPI Creatinine Equation (2021)    Anion gap 10 5 - 15    Comment: Performed at [facility], [address] [place], [place] [address]  CBC     Status: Abnormal   Collection Time: 10/03/23  4:56 AM  Result Value Ref Range   WBC 10.5 4.0 - 10.5 K/uL   RBC 4.07 (L) 4.22 - 5.81 MIL/uL   Hemoglobin 11.2 (L) 13.0 - 17.0 g/dL   HCT 64.8 (L) 60.9 - 47.9 %   MCV 86.2 80.0 - 100.0 fL   MCH 27.5 26.0 - 34.0 pg   MCHC 31.9 30.0 - 36.0 g/dL   RDW 84.5 88.4 - 84.4 %   Platelets 291 150 - 400 K/uL   nRBC 0.0 0.0 - 0.2 %  Comment: Performed at [facility], [address], [place], [place] [address]  CBG monitoring, ED     Status: Abnormal   Collection Time: 10/03/23  4:56 AM  Result Value Ref Range   Glucose-Capillary 113 (H) 70 - 99 mg/dL    Comment: Glucose reference range applies only to samples taken after fasting for at least 8 hours.  ECHOCARDIOGRAM COMPLETE     Status: None   Collection Time: 10/03/23  2:35 PM  Result Value Ref Range   Weight 3,104.08 oz   Height 64 in   BP 127/66 mmHg   Single Plane A2C EF 53.3 %   Single Plane A4C EF 54.7 %   Calc EF 54.8 %   Saharah Sherrow' Lateral 3.00 cm   Area-P 1/2 3.99 cm2   Est EF 60 - 65%   CBC     Status: Abnormal   Collection Time: 10/04/23  1:57 AM  Result Value Ref Range   WBC 12.3 (H) 4.0 - 10.5 K/uL   RBC 4.28 4.22 - 5.81 MIL/uL   Hemoglobin 11.7 (L) 13.0 - 17.0 g/dL   HCT 63.3 (L) 60.9 - 47.9 %   MCV 85.5 80.0 - 100.0 fL   MCH 27.3 26.0 - 34.0 pg   MCHC 32.0 30.0 - 36.0 g/dL   RDW 84.4 88.4 - 84.4 %   Platelets 302 150 - 400 K/uL   nRBC 0.0 0.0 - 0.2 %    Comment: Performed at [facility], [address] [place], [place] [address]  Basic metabolic panel with GFR     Status: Abnormal    Collection Time: 10/04/23  3:21 AM  Result Value Ref Range   Sodium 137 135 - 145 mmol/L   Potassium 4.4 3.5 - 5.1 mmol/L   Chloride 108 98 - 111 mmol/L   CO2 21 (L) 22 - 32 mmol/L   Glucose, Bld 96 70 - 99 mg/dL    Comment: Glucose reference range applies only to samples taken after fasting for at least 8 hours.   BUN 17 8 - 23 mg/dL   Creatinine, Ser 7.87 (H) 0.61 - 1.24 mg/dL   Calcium  9.1 8.9 - 10.3 mg/dL   GFR, Estimated 32 (L) >60 mL/min    Comment: (NOTE) Calculated using the CKD-EPI Creatinine Equation (2021)    Anion gap 8 5 - 15    Comment: Performed at [facility], [address] [place], [place] [address]  Glucose, capillary     Status: None   Collection Time: 10/04/23  5:48 AM  Result Value Ref Range   Glucose-Capillary 96 70 - 99 mg/dL    Comment: Glucose reference range applies only to samples taken after fasting for at least 8 hours.   Comment 1 Notify RN   POCT urinalysis dipstick     Status: None   Collection Time: 10/17/23  2:13 PM  Result Value Ref Range   Color, UA     Clarity, UA     Glucose, UA     Bilirubin, UA     Ketones, UA     Spec Grav, UA     Blood, UA     pH, UA     Protein, UA     Urobilinogen, UA     Nitrite, UA     Leukocytes, UA     Appearance     Odor        Assessment & Plan:  Gregory [name] was seen today for  follow-up.  Right hand paresthesia  Carpal tunnel syndrome of right wrist -     Wrist splint  Seasonal allergic rhinitis due to pollen -     Azelastine  HCl; Place 1 spray into the nose daily.  Dispense: 30 mL; Refill: 2  Other emphysema (HCC) -     Breztri  Aerosphere; Inhale 2 puffs into the lungs 2 (two) times daily.  Dispense: 10.7 g; Refill: 2  Primary insomnia -     Belsomra ; Take 1 tablet (15 mg total) by mouth at bedtime.  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  Dispense: 90 tablet; Refill:  0  Osteoarthritis of lumbar spine, unspecified spinal osteoarthritis complication status -     Cyclobenzaprine  HCl; Take 1 tablet (5 mg total) by mouth 3 (three) times daily as needed. for muscle spams  Dispense: 60 tablet; Refill: 1  Dyslipidemia -     Rosuvastatin  Calcium ; Take 1 tablet (40 mg total) by mouth every evening.  Dispense: 90 tablet; Refill: 0  CKD (chronic kidney disease) stage 4, GFR 15-29 ml/min (HCC) -     Sodium Bicarbonate ; Take 1 tablet (650 mg total) by mouth 2 (two) times daily.  Dispense: 60 tablet; Refill: 2  Essential hypertension -     Lisinopril ; Take 1 tablet (40 mg total) by mouth daily.  Dispense: 90 tablet; Refill: 0  Other orders -     Cetirizine HCl; Take 1 tablet (10 mg total) by mouth every morning.  Dispense: 90 tablet; Refill: 0    Problem List Items Addressed This Visit       Cardiovascular and Mediastinum   Essential hypertension   Relevant Medications   lisinopril  (ZESTRIL ) 40 MG tablet   rosuvastatin  (CRESTOR ) 40 MG tablet     Respiratory   Seasonal allergic rhinitis due to pollen   Relevant Medications   Azelastine  HCl 137 MCG/SPRAY SOLN   COPD (chronic obstructive pulmonary disease) with emphysema (HCC)   Relevant Medications   Azelastine  HCl 137 MCG/SPRAY SOLN   budesonide -glycopyrrolate -formoterol  (BREZTRI  AEROSPHERE) 160-9-4.8 MCG/ACT AERO inhaler   cetirizine (ZYRTEC) 10 MG tablet     Nervous and Auditory   Carpal tunnel syndrome of right wrist   Relevant Medications   Suvorexant  (BELSOMRA ) 15 MG TABS   cyclobenzaprine  (FLEXERIL ) 5 MG tablet   Other Relevant Orders   Wrist splint     Musculoskeletal and Integument   Osteoarthritis of lumbar spine   Relevant Medications   allopurinol  (ZYLOPRIM ) 100 MG tablet   cyclobenzaprine  (FLEXERIL ) 5 MG tablet     Genitourinary   CKD (chronic kidney disease) stage 4, GFR 15-29 ml/min (HCC)   Relevant Medications   sodium bicarbonate  650 MG tablet     Other   Dyslipidemia    Relevant Medications   rosuvastatin  (CRESTOR ) 40 MG tablet   Insomnia   Relevant Medications   Suvorexant  (BELSOMRA ) 15 MG TABS   Right hand paresthesia - Primary   Other Visit Diagnoses       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   Total time spent: 20 minutes. This time includes review of previous notes and results and patient face to face interaction during today'Heidi Maclin visit.  Sherrill Cinderella Perry, MD  11/17/2023   This document may have been prepared by [facility] Voice Recognition software and as such may include unintentional dictation errors.

## 2023-11-21 ENCOUNTER — Other Ambulatory Visit: Payer: Self-pay | Admitting: Cardiology

## 2023-12-04 ENCOUNTER — Other Ambulatory Visit: Payer: Self-pay | Admitting: Internal Medicine

## 2023-12-04 DIAGNOSIS — G4700 Insomnia, unspecified: Secondary | ICD-10-CM

## 2023-12-05 ENCOUNTER — Other Ambulatory Visit: Payer: Self-pay | Admitting: Internal Medicine

## 2023-12-05 DIAGNOSIS — E785 Hyperlipidemia, unspecified: Secondary | ICD-10-CM

## 2023-12-14 ENCOUNTER — Ambulatory Visit (HOSPITAL_BASED_OUTPATIENT_CLINIC_OR_DEPARTMENT_OTHER): Payer: Self-pay | Admitting: Cardiology

## 2023-12-14 DIAGNOSIS — R55 Syncope and collapse: Secondary | ICD-10-CM | POA: Diagnosis not present

## 2023-12-18 ENCOUNTER — Ambulatory Visit: Admitting: Cardiovascular Disease

## 2023-12-24 ENCOUNTER — Other Ambulatory Visit: Payer: Self-pay | Admitting: Internal Medicine

## 2023-12-24 DIAGNOSIS — J438 Other emphysema: Secondary | ICD-10-CM

## 2023-12-24 DIAGNOSIS — G4700 Insomnia, unspecified: Secondary | ICD-10-CM

## 2023-12-26 ENCOUNTER — Ambulatory Visit: Admitting: Cardiovascular Disease

## 2024-01-01 ENCOUNTER — Other Ambulatory Visit: Payer: Self-pay | Admitting: Internal Medicine

## 2024-01-01 DIAGNOSIS — R202 Paresthesia of skin: Secondary | ICD-10-CM

## 2024-01-02 ENCOUNTER — Ambulatory Visit: Admitting: Internal Medicine

## 2024-01-09 ENCOUNTER — Other Ambulatory Visit: Payer: Self-pay

## 2024-02-01 ENCOUNTER — Other Ambulatory Visit: Payer: Self-pay | Admitting: Cardiovascular Disease

## 2024-02-01 ENCOUNTER — Other Ambulatory Visit: Payer: Self-pay | Admitting: Internal Medicine

## 2024-02-01 DIAGNOSIS — F5101 Primary insomnia: Secondary | ICD-10-CM

## 2024-02-01 DIAGNOSIS — J301 Allergic rhinitis due to pollen: Secondary | ICD-10-CM

## 2024-02-01 DIAGNOSIS — R202 Paresthesia of skin: Secondary | ICD-10-CM

## 2024-02-01 DIAGNOSIS — I10 Essential (primary) hypertension: Secondary | ICD-10-CM

## 2024-02-02 ENCOUNTER — Other Ambulatory Visit: Payer: Self-pay | Admitting: Cardiovascular Disease

## 2024-02-05 ENCOUNTER — Other Ambulatory Visit: Payer: Self-pay | Admitting: Internal Medicine

## 2024-02-05 DIAGNOSIS — E785 Hyperlipidemia, unspecified: Secondary | ICD-10-CM

## 2024-02-08 ENCOUNTER — Other Ambulatory Visit: Payer: Self-pay

## 2024-02-12 MED ORDER — COLCHICINE 0.6 MG PO CAPS: ORAL_CAPSULE | ORAL | 0 refills | Status: AC

## 2024-02-14 ENCOUNTER — Other Ambulatory Visit: Payer: Self-pay | Admitting: Internal Medicine

## 2024-02-14 ENCOUNTER — Other Ambulatory Visit: Payer: Self-pay | Admitting: Cardiovascular Disease

## 2024-02-14 DIAGNOSIS — M1A40X0 Other secondary chronic gout, unspecified site, without tophus (tophi): Secondary | ICD-10-CM

## 2024-03-01 ENCOUNTER — Other Ambulatory Visit: Payer: Self-pay | Admitting: Cardiovascular Disease

## 2024-03-01 DIAGNOSIS — I10 Essential (primary) hypertension: Secondary | ICD-10-CM
